# Patient Record
Sex: Female | Born: 1950 | Race: White | Hispanic: No | Smoking: Former smoker
Health system: Southern US, Community
[De-identification: ages and names within clinical notes are randomized; demographics above are authoritative.]

## PROBLEM LIST (undated history)

## (undated) DIAGNOSIS — I509 Heart failure, unspecified: Secondary | ICD-10-CM

## (undated) DIAGNOSIS — K219 Gastro-esophageal reflux disease without esophagitis: Secondary | ICD-10-CM

## (undated) DIAGNOSIS — B159 Hepatitis A without hepatic coma: Secondary | ICD-10-CM

## (undated) DIAGNOSIS — I10 Essential (primary) hypertension: Secondary | ICD-10-CM

## (undated) DIAGNOSIS — J45909 Unspecified asthma, uncomplicated: Secondary | ICD-10-CM

## (undated) DIAGNOSIS — G4730 Sleep apnea, unspecified: Secondary | ICD-10-CM

## (undated) DIAGNOSIS — C50412 Malignant neoplasm of upper-outer quadrant of left female breast: Principal | ICD-10-CM

## (undated) DIAGNOSIS — E119 Type 2 diabetes mellitus without complications: Secondary | ICD-10-CM

## (undated) DIAGNOSIS — M1990 Unspecified osteoarthritis, unspecified site: Secondary | ICD-10-CM

## (undated) DIAGNOSIS — K760 Fatty (change of) liver, not elsewhere classified: Secondary | ICD-10-CM

## (undated) DIAGNOSIS — R609 Edema, unspecified: Secondary | ICD-10-CM

## (undated) DIAGNOSIS — I4891 Unspecified atrial fibrillation: Secondary | ICD-10-CM

## (undated) DIAGNOSIS — N189 Chronic kidney disease, unspecified: Secondary | ICD-10-CM

## (undated) DIAGNOSIS — Z8489 Family history of other specified conditions: Secondary | ICD-10-CM

## (undated) DIAGNOSIS — K589 Irritable bowel syndrome without diarrhea: Secondary | ICD-10-CM

## (undated) DIAGNOSIS — E539 Vitamin B deficiency, unspecified: Secondary | ICD-10-CM

## (undated) DIAGNOSIS — M797 Fibromyalgia: Secondary | ICD-10-CM

## (undated) DIAGNOSIS — D649 Anemia, unspecified: Secondary | ICD-10-CM

## (undated) DIAGNOSIS — I4892 Unspecified atrial flutter: Secondary | ICD-10-CM

## (undated) DIAGNOSIS — G4700 Insomnia, unspecified: Secondary | ICD-10-CM

## (undated) DIAGNOSIS — Z973 Presence of spectacles and contact lenses: Secondary | ICD-10-CM

## (undated) DIAGNOSIS — Z87898 Personal history of other specified conditions: Secondary | ICD-10-CM

## (undated) DIAGNOSIS — J40 Bronchitis, not specified as acute or chronic: Secondary | ICD-10-CM

## (undated) DIAGNOSIS — E039 Hypothyroidism, unspecified: Secondary | ICD-10-CM

## (undated) HISTORY — PX: LUMBAR DISC SURGERY: SHX700

## (undated) HISTORY — PX: CHOLECYSTECTOMY: SHX55

## (undated) HISTORY — DX: Sleep apnea, unspecified: G47.30

## (undated) HISTORY — PX: CARPAL TUNNEL RELEASE: SHX101

## (undated) HISTORY — DX: Essential (primary) hypertension: I10

## (undated) HISTORY — DX: Malignant neoplasm of upper-outer quadrant of left female breast: C50.412

## (undated) HISTORY — DX: Vitamin B deficiency, unspecified: E53.9

## (undated) HISTORY — DX: Irritable bowel syndrome without diarrhea: K58.9

## (undated) HISTORY — DX: Type 2 diabetes mellitus without complications: E11.9

## (undated) HISTORY — DX: Hepatitis a without hepatic coma: B15.9

## (undated) HISTORY — DX: Unspecified atrial flutter: I48.92

## (undated) HISTORY — PX: JOINT REPLACEMENT: SHX530

## (undated) HISTORY — DX: Unspecified osteoarthritis, unspecified site: M19.90

## (undated) HISTORY — PX: ABDOMINAL HYSTERECTOMY: SHX81

## (undated) HISTORY — PX: BACK SURGERY: SHX140

## (undated) HISTORY — PX: TONSILLECTOMY: SUR1361

## (undated) HISTORY — PX: HERNIA REPAIR: SHX51

## (undated) HISTORY — PX: EYE SURGERY: SHX253

## (undated) HISTORY — PX: BREAST SURGERY: SHX581

## (undated) HISTORY — PX: CARDIAC CATHETERIZATION: SHX172

## (undated) HISTORY — PX: TRANSTHORACIC ECHOCARDIOGRAM: SHX275

## (undated) HISTORY — DX: Chronic kidney disease, unspecified: N18.9

## (undated) HISTORY — PX: MASTECTOMY: SHX3

## (undated) HISTORY — PX: DILATION AND CURETTAGE OF UTERUS: SHX78

## (undated) HISTORY — DX: Unspecified atrial fibrillation: I48.91

## (undated) HISTORY — DX: Fatty (change of) liver, not elsewhere classified: K76.0

---

## 1997-06-03 ENCOUNTER — Ambulatory Visit: Admission: RE | Admit: 1997-06-03 | Discharge: 1997-06-03 | Payer: Self-pay | Admitting: Neurosurgery

## 1997-06-09 ENCOUNTER — Encounter: Admission: RE | Admit: 1997-06-09 | Discharge: 1997-08-23 | Payer: Self-pay | Admitting: Anesthesiology

## 2000-10-02 ENCOUNTER — Encounter: Payer: Self-pay | Admitting: Orthopedic Surgery

## 2000-10-10 ENCOUNTER — Inpatient Hospital Stay (HOSPITAL_COMMUNITY): Admission: RE | Admit: 2000-10-10 | Discharge: 2000-10-15 | Payer: Self-pay | Admitting: Orthopedic Surgery

## 2000-11-19 ENCOUNTER — Encounter: Admission: RE | Admit: 2000-11-19 | Discharge: 2000-12-24 | Payer: Self-pay | Admitting: Orthopedic Surgery

## 2004-12-01 ENCOUNTER — Other Ambulatory Visit: Admission: RE | Admit: 2004-12-01 | Discharge: 2004-12-01 | Payer: Self-pay | Admitting: Obstetrics and Gynecology

## 2005-01-29 ENCOUNTER — Encounter (INDEPENDENT_AMBULATORY_CARE_PROVIDER_SITE_OTHER): Payer: Self-pay | Admitting: *Deleted

## 2005-01-29 ENCOUNTER — Ambulatory Visit (HOSPITAL_BASED_OUTPATIENT_CLINIC_OR_DEPARTMENT_OTHER): Admission: RE | Admit: 2005-01-29 | Discharge: 2005-01-29 | Payer: Self-pay | Admitting: Obstetrics and Gynecology

## 2005-08-29 ENCOUNTER — Other Ambulatory Visit: Admission: RE | Admit: 2005-08-29 | Discharge: 2005-08-29 | Payer: Self-pay | Admitting: Obstetrics and Gynecology

## 2006-06-26 ENCOUNTER — Ambulatory Visit: Admission: RE | Admit: 2006-06-26 | Discharge: 2006-06-26 | Payer: Self-pay | Admitting: Gynecologic Oncology

## 2006-06-29 ENCOUNTER — Encounter: Admission: RE | Admit: 2006-06-29 | Discharge: 2006-06-29 | Payer: Self-pay | Admitting: Urology

## 2006-07-28 ENCOUNTER — Inpatient Hospital Stay (HOSPITAL_COMMUNITY): Admission: EM | Admit: 2006-07-28 | Discharge: 2006-08-01 | Payer: Self-pay | Admitting: Emergency Medicine

## 2006-08-02 ENCOUNTER — Inpatient Hospital Stay (HOSPITAL_COMMUNITY): Admission: EM | Admit: 2006-08-02 | Discharge: 2006-08-05 | Payer: Self-pay | Admitting: Emergency Medicine

## 2006-09-18 ENCOUNTER — Ambulatory Visit: Admission: RE | Admit: 2006-09-18 | Discharge: 2006-09-18 | Payer: Self-pay | Admitting: Gynecologic Oncology

## 2008-03-11 ENCOUNTER — Other Ambulatory Visit: Admission: RE | Admit: 2008-03-11 | Discharge: 2008-03-11 | Payer: Self-pay | Admitting: Obstetrics and Gynecology

## 2008-03-11 ENCOUNTER — Encounter: Payer: Self-pay | Admitting: Obstetrics and Gynecology

## 2008-03-11 ENCOUNTER — Ambulatory Visit: Payer: Self-pay | Admitting: Obstetrics and Gynecology

## 2008-03-24 ENCOUNTER — Encounter: Admission: RE | Admit: 2008-03-24 | Discharge: 2008-03-24 | Payer: Self-pay | Admitting: Urology

## 2010-02-20 ENCOUNTER — Inpatient Hospital Stay (HOSPITAL_COMMUNITY): Admission: RE | Admit: 2010-02-20 | Discharge: 2010-02-24 | Payer: Self-pay | Admitting: Orthopedic Surgery

## 2010-03-23 ENCOUNTER — Encounter
Admission: RE | Admit: 2010-03-23 | Discharge: 2010-04-19 | Payer: Self-pay | Source: Home / Self Care | Attending: Orthopedic Surgery | Admitting: Orthopedic Surgery

## 2010-04-19 ENCOUNTER — Encounter
Admission: RE | Admit: 2010-04-19 | Discharge: 2010-05-15 | Payer: Self-pay | Source: Home / Self Care | Attending: Orthopedic Surgery | Admitting: Orthopedic Surgery

## 2010-07-04 LAB — GLUCOSE, CAPILLARY
Glucose-Capillary: 104 mg/dL — ABNORMAL HIGH (ref 70–99)
Glucose-Capillary: 139 mg/dL — ABNORMAL HIGH (ref 70–99)
Glucose-Capillary: 141 mg/dL — ABNORMAL HIGH (ref 70–99)
Glucose-Capillary: 157 mg/dL — ABNORMAL HIGH (ref 70–99)
Glucose-Capillary: 159 mg/dL — ABNORMAL HIGH (ref 70–99)
Glucose-Capillary: 162 mg/dL — ABNORMAL HIGH (ref 70–99)
Glucose-Capillary: 163 mg/dL — ABNORMAL HIGH (ref 70–99)

## 2010-07-04 LAB — CBC
HCT: 22.3 % — ABNORMAL LOW (ref 36.0–46.0)
HCT: 26.8 % — ABNORMAL LOW (ref 36.0–46.0)
Hemoglobin: 7.5 g/dL — ABNORMAL LOW (ref 12.0–15.0)
Hemoglobin: 8.8 g/dL — ABNORMAL LOW (ref 12.0–15.0)
MCH: 31 pg (ref 26.0–34.0)
MCHC: 33.7 g/dL (ref 30.0–36.0)
MCHC: 33.8 g/dL (ref 30.0–36.0)
MCHC: 33.8 g/dL (ref 30.0–36.0)
MCV: 91.8 fL (ref 78.0–100.0)
MCV: 92.9 fL (ref 78.0–100.0)
Platelets: 287 10*3/uL (ref 150–400)
Platelets: 300 10*3/uL (ref 150–400)
RBC: 2.85 MIL/uL — ABNORMAL LOW (ref 3.87–5.11)
RDW: 13.8 % (ref 11.5–15.5)
RDW: 14.2 % (ref 11.5–15.5)
WBC: 11.1 10*3/uL — ABNORMAL HIGH (ref 4.0–10.5)

## 2010-07-04 LAB — BASIC METABOLIC PANEL
BUN: 17 mg/dL (ref 6–23)
BUN: 20 mg/dL (ref 6–23)
BUN: 25 mg/dL — ABNORMAL HIGH (ref 6–23)
CO2: 17 mEq/L — ABNORMAL LOW (ref 19–32)
CO2: 28 mEq/L (ref 19–32)
Calcium: 8.7 mg/dL (ref 8.4–10.5)
Calcium: 8.7 mg/dL (ref 8.4–10.5)
Calcium: 9.1 mg/dL (ref 8.4–10.5)
Chloride: 100 mEq/L (ref 96–112)
Chloride: 98 mEq/L (ref 96–112)
Creatinine, Ser: 1.41 mg/dL — ABNORMAL HIGH (ref 0.4–1.2)
Creatinine, Ser: 1.47 mg/dL — ABNORMAL HIGH (ref 0.4–1.2)
Creatinine, Ser: 1.57 mg/dL — ABNORMAL HIGH (ref 0.4–1.2)
GFR calc non Af Amer: 36 mL/min — ABNORMAL LOW (ref >60)
Glucose, Bld: 112 mg/dL — ABNORMAL HIGH (ref 70–99)
Glucose, Bld: 137 mg/dL — ABNORMAL HIGH (ref 70–99)
Potassium: 4.4 mEq/L (ref 3.5–5.1)
Potassium: 5.7 mEq/L — ABNORMAL HIGH (ref 3.5–5.1)
Sodium: 135 mEq/L (ref 135–145)

## 2010-07-05 LAB — TYPE AND SCREEN
ABO/RH(D): O POS
Antibody Screen: NEGATIVE
Unit division: 0

## 2010-07-05 LAB — APTT: aPTT: 44 seconds — ABNORMAL HIGH (ref 24–37)

## 2010-07-05 LAB — URINALYSIS, ROUTINE W REFLEX MICROSCOPIC
Bilirubin Urine: NEGATIVE
Glucose, UA: NEGATIVE mg/dL
Hgb urine dipstick: NEGATIVE
Ketones, ur: NEGATIVE mg/dL
Nitrite: NEGATIVE
Protein, ur: NEGATIVE mg/dL
Specific Gravity, Urine: 1.022 (ref 1.005–1.030)
Urobilinogen, UA: 0.2 mg/dL (ref 0.0–1.0)
pH: 5 (ref 5.0–8.0)

## 2010-07-05 LAB — GLUCOSE, CAPILLARY
Glucose-Capillary: 108 mg/dL — ABNORMAL HIGH (ref 70–99)
Glucose-Capillary: 175 mg/dL — ABNORMAL HIGH (ref 70–99)

## 2010-07-05 LAB — COMPREHENSIVE METABOLIC PANEL
ALT: 17 U/L (ref 0–35)
AST: 23 U/L (ref 0–37)
Albumin: 4 g/dL (ref 3.5–5.2)
CO2: 26 mEq/L (ref 19–32)
Calcium: 9.7 mg/dL (ref 8.4–10.5)
Creatinine, Ser: 1.85 mg/dL — ABNORMAL HIGH (ref 0.4–1.2)
GFR calc Af Amer: 34 mL/min — ABNORMAL LOW (ref >60)
GFR calc non Af Amer: 28 mL/min — ABNORMAL LOW (ref >60)
Sodium: 137 mEq/L (ref 135–145)
Total Protein: 7.2 g/dL (ref 6.0–8.3)

## 2010-07-05 LAB — CBC
HCT: 30.7 % — ABNORMAL LOW (ref 36.0–46.0)
Hemoglobin: 10.1 g/dL — ABNORMAL LOW (ref 12.0–15.0)
MCH: 30.4 pg (ref 26.0–34.0)
MCHC: 33.1 g/dL (ref 30.0–36.0)
MCV: 91.9 fL (ref 78.0–100.0)
Platelets: 369 10*3/uL (ref 150–400)
RBC: 3.34 MIL/uL — ABNORMAL LOW (ref 3.87–5.11)
RDW: 14.1 % (ref 11.5–15.5)
WBC: 7.8 10*3/uL (ref 4.0–10.5)

## 2010-07-05 LAB — URINE MICROSCOPIC-ADD ON

## 2010-07-05 LAB — SURGICAL PCR SCREEN
MRSA, PCR: NEGATIVE
Staphylococcus aureus: NEGATIVE

## 2010-07-05 LAB — ABO/RH: ABO/RH(D): O POS

## 2010-09-05 NOTE — Consult Note (Signed)
NAMEMAURYA, Maureen [name]               ACCOUNT NO.:  [id number]   MEDICAL RECORD NO.:  [id number]          PATIENT TYPE:  OUT   LOCATION:  GYN                          FACILITY:  [facility]   PHYSICIAN:  Paola A. Alycia Rossetti, MD    DATE OF BIRTH:  March 08, 1951   DATE OF CONSULTATION:  09/18/2006  DATE OF DISCHARGE:                                 CONSULTATION   Ms. Thorson is a 60 year old who was referred to [place] by Dr. Cherylann Banas for a  right ovarian mass measuring 14 x 15 cm.  She subsequently underwent TAH-  BSO July 25, 2006.  Operative findings included a right adnexal mass  with obvious torsion and a benign endometrial endocervical polyp.  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  The uterus had an  endocervical polyp with endometrium,  unremarkable myometrium and the  left ovary was also negative.  Postoperatively she had wound separation  due to morbid obesity with a BMI of 57 and diabetes.  She currently has  been using a wound vac home and had excellent results with that.  In  addition, she was a four pack a day smoker and subsequently quit smoking  which has been dramatic for her.  She otherwise is doing fairly well.  She continues to have occasional abdominal tinges of pain and in the  evening is still requiring some Dilaudid pain medication.  She denies  any vaginal bleeding.  She has return of bowel and bladder function and  is tolerating p.o. without difficulty.   PHYSICAL EXAMINATION:  Weight 300 pounds which is down to 32 pounds from  March, blood pressure 134/80, well-nourished, well-developed female in  no acute distress.  ABDOMEN:  Shows a well-healing vertical skin  incision.  She still has an area open about 8 cm x 4 cm and 3 cm deep  with pink granulation tissue.  There is no surrounding erythema.  Abdomen is soft.  PELVIC:  External genitalia is within normal limits.  Speculum  examination  poorly tolerated by the patient.  She has a very narrow  arch.  Could not visualize the vaginal cuff.  Bimanual examination:  The  vaginal cuff is nontender and it feel smooth.  There is no fluctuance.  Exam is limited by habitus.   ASSESSMENT:  41. 60 year old status post TAH-BSO for benign disease who had a      superficial wound separation.  She was seen on April 11 for this      wound separation and.  She was complaining of shortness of breath      and wheezes.  She had O2 sat of 87%, respiratory rate of 45% and      was admitted to the hospitalist and managed on the hospitalist      service.  She was discharged 3 days later and has been doing fine      at home.  As she has not had metastatic or malignant disease, she      will follow up with [place] on a p.r.n.  basis.  She continue using her      wound vac and will      await a call from the home health agency discharging her from said      wound vac.  2. I refilled her Dilaudid 2 mg #100.  She will follow up with her      primary physician and can follow up with [place] in the future should      the need arise.      Paola A. Alycia Rossetti, MD  Electronically Signed     PAG/MEDQ  D:  09/18/2006  T:  09/18/2006  Job:  [id number]   cc:   Quillian Quince L. Cherylann Banas, M.D.  Fax: [phone]   Caswell Corwin, R.N.  [address], [place] [address]   Kerr Terance Hart, M.D.  Fax: [place]  Fax: [phone]

## 2010-09-08 NOTE — Op Note (Signed)
Maureen [name], Maureen [name]               ACCOUNT NO.:  [id number]   MEDICAL RECORD NO.:  [id number]          PATIENT TYPE:  AMB   LOCATION:  [facility]                         FACILITY:  [facility]   PHYSICIAN:  Daniel L. Gottsegen, M.D.DATE OF BIRTH:  08-11-1950   DATE OF PROCEDURE:  01/29/2005  DATE OF DISCHARGE:                                 OPERATIVE REPORT   PREOPERATIVE DIAGNOSIS:  Atypical glandular cells on Pap.   POSTOPERATIVE DIAGNOSIS:  Atypical glandular cells on Pap.   OPERATIONS:  Colposcopy with cervical biopsy, ECC, hysteroscopy with  endometrial sampling.   SURGEON:  Daniel L. Cherylann Banas, M.D.   ANESTHESIA:  General endotracheal.   INDICATIONS:  The patient is a 60 year old female with a previous conization  for CIN-3 who was seen in the office for routine yearly exam and was found  to have an atypical Pap smear showing glandular cells that were atypical. An  attempt was made to assess her in the office. Due to her size, the cervical  stenosis from her conization it was not possible to do all the appropriate  samplings, so it was felt it would be done in the operating room. In  addition, hysteroscopy would be done to be sure that the worse endometrial  area was sampled as well as removing any polyps or other pathology causing  the above.   FINDINGS:  External is normal except for some polypoid light growth off the  anterior wall of the cervix. The cervix is clean, but stenotic. The uterus  is top normal size and shape. No adnexal pathology can be appreciated. At  the time hysteroscopy, the patient had a completely normal endometrial  cavity except for what appeared to be a submucous myoma that was not  intracavitary coming off the anterior wall of the fundus.   DESCRIPTION OF PROCEDURE:  After adequate general endotracheal anesthesia,  the patient was placed in the dorsal lithotomy position, prepped and draped  in the usual sterile manner. The patient was colposcoped with  and without 4%  acetic acid. The squamocolumnar junction can be somewhat identified although  it was not a completely satisfactory colposcopy due to the patient's size  and also the previous conization. There was a polypoid like growth on the  anterior lip of cervix that did not appear to have white epithelium on it  but looked atypical enough that it should be biopsied. It was removed in  toto using cautery without any bleeding. An ECC was then done. The patient  was then dilated and she was hysteroscoped and there was no endometrial  pathology except for possibly a myoma off the anterior fundal wall that was  pushing on the cavity, it was not intracavitary. Endometrial sampling was  obtained being careful to samplein the area described above as well as the  rest of the cavity. The procedure was terminated. Blood loss was minimal,  fluid deficit was less than 100 mL. The patient tolerated the procedure well  and left the operating room satisfactory condition.      Daniel L. Cherylann Banas, M.D.  Electronically Signed  DLG/MEDQ  D:  01/29/2005  T:  01/29/2005  Job:  [id number]

## 2010-09-08 NOTE — Discharge Summary (Signed)
[facility]  Patient:    Maureen [name], Maureen [name]               MRN: [id number] Adm. Date:  [id number] Disc. Date: [id number] Attending:  Carlyon Shadow Dictator:   Peter Congo, P.A.                           Discharge Summary  ADMISSION DIAGNOSES: 1. Osteoarthritis to the left knee. 2. Fibromyalgia. 3. Irritable bowel syndrome. 4. Sleep apnea. 5. Hypertension.  DISCHARGE DIAGNOSES: 1. Osteoarthritis left knee, status post left total knee arthroplasty. 2. Postoperative hemorrhagic anemia, stable. 3. Hypertension. 4. Irritable bowel. 5. Sleep apnea.  OPERATIONS:  On October 10, 2000, the patient underwent a left total knee arthroplasty.  Surgeon:  Dr. Justice Britain.  Assistant:  Bobbye Morton, P.A.-C.  Anesthesia:  Spinal.  COMPLICATIONS:  None.  CONSULTATIONS:  None.  BRIEF ADMISSION HISTORY:  Ms. Andree is a pleasant 60 year old white female who has had ongoing problems with her knee for about five years.  X-rays revealed deterioration of the knee and tricompartmental arthritis.  She was treated with nonsteroidals, Synvisc injections which proved to be only minimal relief. After discussion and consideration, it was felt that she would benefit from undergoing a total knee arthroplasty.  The risks and benefits were discussed as well as the procedure, and she presented to the hospital on the above-stated date of the surgery.  HOSPITAL COURSE:  The patient was admitted on October 10, 2000, and had the above-stated surgery.  She tolerated the surgery well without any complications.  While in the operating room, two Hemovac drains were placed. These were discontinued on postop day #1 without difficulty.  While in the operating room, the wound was dressed in a sterile fashion.  On postop day #1, the dressings were found to be clean, dry, and intact.  On postop day #2, the dressing was changed.  The wound was found to be free of any erythema or drainage.   The wound was dressed daily and found to be free of any erythema or drainage throughout the remainder of her hospital stay.  While in the operating room, a Foley catheter was placed.  This was discontinued on postop day #1, the patient was found to be voiding well on her throughout the remainder of her hospital stay.  Postoperatively, the patient utilized a PCA of morphine for pain.  She had a known high tolerance for pain medications. She was not tolerating the PCA well, so on October 10, 2000, she was changed to a high-dose Dilaudid PCA.  This controlled her pain.  On postop day #3, the PCA was discontinued, and she did obtain adequate control from her pain utilizing OxyContin 40 mg controlled release one p.o. b.i.d. with Percocet for breakthrough pain.  Postoperatively, Ancef was used for infection control x 3 doses.  She was placed on Coumadin which was monitored by the pharmacy for DVT prophylaxis.   An IV of D5-1/2 normal saline at 75 cc per hour was started postoperatively.  This was discontinued on postop day #1.  She was found to be taking p.o. fluids and food well prior to discharge and having bowel movements.  Hemoglobin and hematocrit was monitored daily for three days and found to be stable.  She was placed on a weightbearing as tolerated activity status to the left lower extremity.  She progressed well with physical therapy and occupational therapy.  By the date of  discharge, she was ambulating 300 feet with only moderate assistance.  She wore a knee immobilizer to her left lower extremity until postop day #3.  She wears bilateral TED hose.  Her home medications were resumed with the exception of the Celebrex.  On postop day #5, it was felt that she was medically stable for discharge.  LABORATORY AND X-RAY DATA:  Hemoglobin and hematocrit on October 02, 2000, were 13.4 and 38.8 respectively.  On October 12, 2000, hemoglobin was 10.3, hematocrit was 30.0.  On October 13, 2000, hemoglobin  was 10.1, hematocrit 39.4.  PT/INR on October 11, 2000, was 14.2 and 1.2 respectively.  PT on October 12, 2000, was 17.9, INR was 1.8.  On October 14, 2000, PT was 21.7, INR was 2.5.  BMET on October 11, 2000, was within normal limits other than glucose of 154 and a BUN of 5.  UA on October 01, 2000, revealed trace hemoglobin and a few epithelial cells.  Radiology:  A two-view chest x-ray on October 02, 2000 was negative for active disease.  Mild right lower lobe atelectasis and/or scarring.  EKG on October 10, 2000, revealed normal sinus rhythm with an ST abnormality in the left preliminary.  EKG on October 02, 2000, revealed an unusual T axis, possible ectopic atrial rhythm versus lead reversal, low-voltage QRS, and was confirmed by Dr. Alla German.  CONDITION ON DISCHARGE:  Improved.  DISCHARGE DISPOSITION:  The patient was discharged home.  She was to have physical therapy and Coumadin management through [facility].  She was to continue her home medications with the exception of Celebrex.  DISCHARGE MEDICATIONS: 1. She was given prescriptions for OxyContin controlled release 40 mg #40 one    p.o. b.i.d. with no refills. 2. Percocet 5 mg #40 one to two p.o. q.4-6h. p.r.n. breakthrough pain with no    refills. 3. Robaxin #40 p.r.n. spasm with no refills. 4. Coumadin per pharmacy. 5. Tylenol for fever.  DISCHARGE INSTRUCTIONS:  She may shower on postop day #5.  Low-sodium diet.  DISCHARGE FOLLOWUP:  She is to follow up with Dr. Onnie Graham two weeks from the date of her surgery.  She is to call the office to make an appointment.  If she has any fevers greater than 101, call the office. DD:  10/15/00 TD:  10/15/00 Job: [id number]

## 2010-09-08 NOTE — Consult Note (Signed)
Maureen [name], Maureen [name]               ACCOUNT NO.:  [id number]   MEDICAL RECORD NO.:  [id number]          PATIENT TYPE:  OUT   LOCATION:  GYN                          FACILITY:  [facility]   PHYSICIAN:  Paola A. Alycia Rossetti, MD    DATE OF BIRTH:  [birth year]   DATE OF CONSULTATION:  06/26/2006  DATE OF DISCHARGE:                                 CONSULTATION   REFERRING PHYSICIAN:  Cherly Anderson. Cherylann Banas, M.D.   The patient is seen today in consultation at the request of Dr.  Cherylann Banas.  [name] is a 60 year old gravida 3, para 3 who has been  menopausal for the past 6-7 years.  She has never taken any hormone  replacement therapy.  Approximately 2 weeks ago she had a week's history  of abdominal and back pain.  She presented to her local emergency room  and had a CT scan of the abdomen and pelvis performed.  CT was performed  on June 16, 2006.  It revealed a hypodense lesion in the interpole  region of the left kidney with calcified rim.  Within the pelvis there  was a large cystic mass measuring 14.6 x 14.1 x 13.5 cm arising from the  left ovary.  The majority of contents of the cyst were simple fluid,  however, there was one slightly thickened septation along the right side  of the mass where it originated from the ovary.  There was no evidence  of any adenopathy or free fluid.  As a result of this renal mass, she  also says underwent a CT scan with contrast.  It revealed a 16 x 15 mm  right renal mid to lower pole indeterminate hypodense lesion with the  recommendations for an MRI.  The patient had a CA-125 which was 8.2.  She comes in today for evaluation.  She has been seen by Dr. Terance Hart in  the past in neurology some microscopic hematuria many years ago, the  workup of which is negative.  She is currently under his care for  evaluation of this renal mass.  She has an MRI scheduled for this coming  Saturday.  Since the pain she had 2 weeks ago resolved after about a  week, she has had  no further episodes of discomfort.  She denies any  change in bowel or bladder habits.  She did have some nausea and  vomiting with the episodes of pain but that is now decreased and  resolved.  She denies any chest pain, shortness of breath, headaches,  visual changes.  She denies any early satiety, significant change in  bowel or bladder habits, vaginal or rectal bleeding.   PAST MEDICAL HISTORY:  1. IBS.  2. Microscopic hematuria.  3. Diabetes.  She is not sure what her last A1c was.  She occasionally      does Accu-Cheks and they range in the 110s-150s.  4. She has fibromyalgia.  5. Arthritis.  6. Morbid obesity.  7. Hypercholesterolemia.  8. Hypertension.   MEDICATIONS:  Celebrex, cholestyramine, Ambien, Lomotil p.r.n.,  tizanidine p.r.n. and Lisinopril.   ALLERGIES:  KEFLEX which  causes hives.  CODEINE causes heartburn.  Narcotics such as PERCOCET causes itching.  She does well with Vicodin  and OxyContin.   PAST SURGICAL HISTORY:  1. Groin hernia repair as a child.  2. Tonsillectomy.  3. Eye surgery x2.  4. Three cesarean sections.  5. Left knee replacement.  6. Cholecystectomy.  7. Hemorrhoidectomy.  8. Diskectomy.   SOCIAL HISTORY:  She smokes three packs a day.  She has done this for 30  years.  She denies use of alcohol.  She is separated.  She is not  working.   FAMILY HISTORY:  Significant for hypertension.  Her mother had breast  cancer diagnosed at the age of 74.  Maternal grandmother had colon  cancer diagnosed at the age of 61 and her father had a myocardial  infarction about the age of 43+.   HEALTH MAINTENANCE:  Her last mammogram was October 2006.  She has not  had a colonoscopy in about 10 years.  Her last Pap smear was less than a  year ago.   PHYSICAL EXAMINATION:  VITAL SIGNS:  Weight 332 pounds, height 5 feet 4,  blood pressure 148/82, pulse 104.  GENERAL APPEARANCE:  Well-nourished, well-developed female in no acute  distress.  NECK:   Supple.  There is no lymphadenopathy, no thyromegaly.  LUNGS:  Rancorous but otherwise clear to auscultation bilaterally.  CARDIOVASCULAR:  Regular rate and rhythm.  ABDOMEN:  Morbidly obese.  She does have a rectus diastasis above the  umbilicus.  There is no lesions in the intertriginous zones.  There is  no palpable masses or no tenderness.  Exam is limited by habitus.  Groins are negative for adenopathy.  EXTREMITIES:  Well-healed surgical incision of her left leg.  She has 2+  pitting edema equal bilaterally.  PELVIC:  External genitalia is within normal limits.  Bimanual  examination of the cervix is palpably normal.  There is a mass arising  in the right adnexa.  I cannot separate it from the uterus.  It feels  mobile.  Exam is limited by morbid obesity.   ASSESSMENT:  33. A 60 year old with a large primarily cystic mass arising from the      right ovary who has no evidence for metastatic disease on her CT      scan, has a CA-125 of 8.2.  The patient does have a renal mass that      is of uncertain potential.  At this time workup is continuing.  She      would very much, which is understandable, wish to have the renal      lesion addressed at the same time as her ovarian mass, therefore      will await final disposition until she has her MRI at this coming      Saturday and has a discussion with Dr. Terance Hart regarding what      needs to be done.  She does know that our office will be closed      next week until Thursday morning.  She was given our card and was      instructed to contact us once the disposition regarding the renal      mass has so that we can coordinate schedules.  We are tentatively      considering the possibility of July 23, 2006, or July 30, 2006,      for surgery.  Surgery would include exploratory laparotomy via      vertical midline incision.  She wishes to have a TAH-BSO and     understands that attempt will be made at staging, however, because      of  her habitus even if this is a malignant process, staging may be      limited because of technical limitations and considerations.  She      accepts this.  Risk of surgery including bleeding, infection,      injury to surrounding organs including but not limited to bowel,      bladder, ureters were discussed with the patient.  The risks of      cardiovascular and thromboembolic disease were also reviewed.  We      did discuss her smoking and that it would be optimal for her      to decrease the amount of smoking that she does before surgery.      She states she has quit before when she had her three children and      will contemplate this.  2. She will stay up-to-date on her mammograms.      Paola A. Alycia Rossetti, MD  Electronically Signed     PAG/MEDQ  D:  06/26/2006  T:  06/26/2006  Job:  [id number]   cc:   Cherly Anderson. Cherylann Banas, M.D.  Fax: [place]  Fax: [place] Terance Hart, M.D.  Fax: [phone]   Caswell Corwin, R.N.  [address], [place] [address]

## 2010-09-08 NOTE — H&P (Signed)
Maureen [name], Maureen [name]               ACCOUNT NO.:  [id number]   MEDICAL RECORD NO.:  [id number]          PATIENT TYPE:  INP   LOCATION:  1402                         FACILITY:  [facility]   PHYSICIAN:  Barbette Merino, M.D.      DATE OF BIRTH:  20-Aug-1950   DATE OF ADMISSION:  08/02/2006  DATE OF DISCHARGE:                              HISTORY & PHYSICAL   PRIMARY CARE PHYSICIAN:  The patient is unassigned. Her PCP is in [place], [employer].   PRESENTING COMPLAINT:  Shortness of breath x1 day.   HISTORY OF PRESENT ILLNESS:  The patient is a 60 year old female with  recent ovarian cyst. She had total abdominal hysterectomy performed at  [facility].  When she returned here, she had a problem with nausea and  vomiting.  Dr. Alycia Rossetti, who is her gynecologist at [facility], also has  privileges here; hence she was admitted here on July 28, 2006 all the  way to August 01, 2006. During that period, she was discovered to have  some abdominal ileus.  That has resolved.  She went home yesterday and  then started having shortness of breath.  Per the patient, however, she  was wheezing even prior to leaving the hospital.  Hence she decided to  come in today.  In the ED preliminary results showed the patient having  shortness of breath but no evidence of PE.  She was however found to  have some pleural effusion.  We were subsequently called to admit the  patient.   PAST MEDICAL HISTORY:  Significant mainly for hypertension, ovarian  cyst, fibromyalgia, diet-controlled diabetes.  Morbid obesity and  irritable bowel syndrome.   ALLERGIES:  She is allergic to CODEINE, VICODIN and KEFLEX.   SOCIAL HISTORY:  The patient lives in [place], [place] with her  family.  She smoked about 4 packs a day for many years until last week.  Social alcohol.  No IV drug use.   FAMILY HISTORY:  Mom died of cancer.  Hypertension runs in both sides of  the family.   REVIEW OF SYSTEMS:  A 12-point review  of systems is mainly per HPI.  No  nausea, vomiting or diarrhea. No fever.  No headache.  Her shortness of  breath main complain, no chest pain, no cough.   PHYSICAL EXAMINATION:  VITAL SIGNS:  On exam today, temperature 98.3,  blood pressure 161/64, pulse 73, respiratory rate 24, sats 99% on 2  liters.  GENERAL:  She is awake, alert, oriented, very pleasant woman in no acute  distress but obese.  HEENT: PERRL.  EOMI.  NECK:  Supple.  No JVD, no lymphadenopathy.  RESPIRATORY:  She has good air entry bilaterally with mild expiratory  wheezing.  Mild crackles on the right lung base.  ABDOMEN:  Obese, soft with visible surgical scar which is open.  EXTREMITIES:  No edema, cyanosis or clubbing.   LABORATORY DATA:  Sodium 139, potassium 3.0, chloride 101, CO2 29,  glucose 127, BUN 2, creatinine 0.79, calcium 8.3.  Myoglobin 198.  BNP  253.  White count 9.3, hemoglobin  10.9, platelet count 222.  Chest x-ray  was limited by the patient's size but showed mild cardiomegaly without  any acute disease which can not exclude mild venous congestion.  Chest  CT without contrast showed no PE.  There is moderate pleural effusions  with interstitial and patchy air space opacities, febrile congestive  heart failure over atypical pneumonia, some nodularity in the right  upper lobe, possibly inflammatory but follow-up chest x-ray after  resolution was recommended.   ASSESSMENT:  Therefore this is a 60 year old female status post recent  surgery presenting with shortness of breath.  The patient agreed that  she has had bronchitis on and off.  She has mild wheezing which makes  this possibly COPD, however, she could also have some elements of  congestive heart failure.  Other differential here is her pleural  effusion which may be causing the problem.  She also has some nodularity  in her lungs which could be playing a role.   PLAN:  1. Shortness of breath.  Will treat the patient for COPD  exacerbation.      Will consider mild diuresis but only if fluid restriction did not      resolve any problem.  In the meantime, she will be on oxygen      nebulizers.  I will treat her empirically with some antibiotics.      We will repeat the patient's chest x-ray in the morning. If pleural      effusion is clearly visible, we may have to get thoracenteses and      cytology, especially with a nodularity in the right upper lung so      we can exclude possible malignancy.  With her heavy tobacco use,      lung cancer is definitely a possibility.  2. Lung nodule. As indicated above, the patient will require further      follow-up especially with her longstanding history of tobacco use.  3. Hypokalemia. Will replete her potassium. This is most likely coming      from her nebulizers.  4. Hypertension.  I will resume her lisinopril and I will see how the      patient does while in the hospital.  5. Moderate pleural effusion. As indicated above, we will consider      possible thoracentesis if her pleural effusion persists.  Will also      check 2-D echocardiogram.  I will follow the patient closely.      Barbette Merino, M.D.  Electronically Signed     LG/MEDQ  D:  08/02/2006  T:  08/03/2006  Job:  [id number]

## 2010-09-08 NOTE — Discharge Summary (Signed)
Maureen [name], Maureen [name]               ACCOUNT NO.:  [id number]   MEDICAL RECORD NO.:  [id number]          PATIENT TYPE:  INP   LOCATION:  1402                         FACILITY:  [facility]   PHYSICIAN:  Barbette Merino, M.D.      DATE OF BIRTH:  04-Jul-1950   DATE OF ADMISSION:  08/02/2006  DATE OF DISCHARGE:  08/05/2006                               DISCHARGE SUMMARY   PRIMARY CARE PHYSICIAN:  The patient is unassigned  with her primary  care physician being in [facility].   DISCHARGE DIAGNOSES:  1. Acute shortness of breath most likely chronic obstructive pulmonary      disease versus cardiac.  2. Hypertension.  3. Transient hypokalemia.  4. Morbid obesity.  5. Recent tobacco use.  6. Normocytic anemia.  7. Mild pleural effusion.  8. Right upper lung nodule.   DISCHARGE MEDICATIONS:  Discharge medications were:  1. Avelox 400 mg daily.  2. Phenergan 25 mg  q.6h. p.r.n.  3. Lisinopril 10 mg daily.  4. Motrin 200 mg p.r.n.  5. Celebrex 200 mg daily.  6. Dilaudid 2 mg p.o. q.6h. p.r.n.   DISPOSITION:  The patient was feeling better at the time of discharge.  She has home health arranged for wound care at home due to her open  abdominal wounds.  Otherwise, she is to continue her followup with Dr.  Alycia Rossetti and with her gynecologist.   PROCEDURES:  Procedures performed include:  1. Chest x-ray on April11, 2008 that showed mild cardiomegaly and      low lung volumes, no definite acute disease.  2. CT angiogram of the chest on April11, 2008 that showed no      embolus or dissection, moderate pleural effusion with interstitial      and patchy air space opacities, febrile congestive heart failure,      some nodularity in the right upper lobe; this could be      inflammatory.   CONSULTATIONS:  None.   BRIEF HISTORY AND PHYSICAL:  Please refer to my dictated history and  physical on admission.  In short, however, this is a 60 year old female  with history of recent surgery for ovarian  cyst.  The patient had total  abdominal hysterectomy in [facility].  She was in the hospital earlier  in April with nausea, vomiting and admitted under OB/GYN service.  She  subsequently got discharged, but returned this time around with  worsening shortness of breath.  The patient was admitted for further  management of her shortness of breath.   HOSPITAL COURSE:  PROBLEM #1:  SHORTNESS OF BREATH:  The patient had  prior history of tobacco but quit recently.  Initial evaluation showed  some mild expiratory wheezing but she had no formal diagnosis of COPD.  She was subsequently treated for both COPD and possible CHF.  She was  diuresed as well as given nebulizers, steroids and antibiotics.  She  responded to treatment promptly.  A 2-D echo was done just prior to  discharge.  The patient is to follow-up with Dr. Alycia Rossetti and further  treatment will be by her  primary care physician.   PROBLEM #2:  HYPERTENSION:  Her blood pressure was elevated on admission  but subsequently controlled.  She was doing well on her home medication  prior to discharge.   PROBLEM #3:  HYPOKALEMIA:  Again, this was repleted and was transient  with potassium 3.1.   PROBLEM #4:  MORBID OBESITY:  The patient was counseled again during  this hospitalization.   PROBLEM #5:  WOUND CARE:  The patient had an open wound in the abdomen  from her recent surgery that was being dressed while she was here in the  hospital.  She was discharged with home health RN for wound care as  well.      Barbette Merino, M.D.  Electronically Signed     LG/MEDQ  D:  09/01/2006  T:  09/02/2006  Job:  [id number]

## 2010-09-08 NOTE — H&P (Signed)
Maureen [name], Maureen [name]               ACCOUNT NO.:  [id number]   MEDICAL RECORD NO.:  [id number]          PATIENT TYPE:  INP   LOCATION:  [place]                         FACILITY:  [facility]   PHYSICIAN:  Doran Heater. Fore, M.D.   DATE OF BIRTH:  [birth year]   DATE OF ADMISSION:  07/28/2006  DATE OF DISCHARGE:                              HISTORY & PHYSICAL   CHIEF COMPLAINT:  Nausea and vomiting.   HISTORY:  The patient is a 60 year old who underwent TAH/BSO for large  right ovarian cyst in [place], [place] on July 24, 2006.  The  patient is a patient of Dr. Leonel Ramsay in [place], but the surgery was  performed by Dr. Nancy Marus of [facility].  The patient did well until  approximately midnight on July 27, 2006, after having been discharged  after a day in the hospital [facility].  She began having severe nausea  and vomiting at the time mentioned above, which has been unrelenting  until this point.  She presented in the emergency room on April 6 at  1313 hours with the complaint of severe nausea and vomiting and  weakness.  Further studies were done which indicated that the patient  had possible small-bowel obstruction versus postoperative ileus.  She is  admitted at this time for further observation and treatment if  necessary.   PAST MEDICAL HISTORY:  1. History of hypertension for which she takes lisinopril 10 mg daily.  2. She relates that she is a borderline diabetic, but she has been      managing her problem with diet only.  3. She is a smoker and denies alcohol or drug use.  4. Past medical history otherwise essentially noncontributory   REVIEW OF SYSTEMS:  HEENT: Negative prior. RESPIRATORY:  As noted above.  GASTROINTESTINAL:  As noted above.  GENITOURINARY:  As noted above.  NEUROMUSCULAR:  Negative.   PHYSICAL EXAMINATION:  VITAL SIGNS:  Weight is not recorded but the  patient is obviously morbidly obese.  Blood pressure on admission  165/75.  Two and a half hours  later the blood pressure was 168/66, pulse  approximately 95, respirations 20, temperature 97.8 on admission to the  ED and 98.9 approximately one and a half hours later.  GENERAL:  Well-developed, obese, white female in moderate distress,  retching and vomiting small amounts of gastric contents. HEENT:  Negative.  NECK:  Without bruits.  HEART:  Regular rate and rhythm without murmurs.  LUNGS:  Clear to P&A.  ABDOMEN:  Distended and quite tense.  There are no bowel sounds audible.  PELVIC:  Pelvic examination is deferred as the patient is immediately  postop.  The incision appears intact and dry and is a lower vertical  incision.  EXTREMITIES:  Quite obese but on palpation of the calves revealed no  Homans' sign.  CENTRAL NERVOUS SYSTEM:  Grossly intact.   IMPRESSION:  Status post total abdominal hysterectomy/bilateral salpingo-  oophorectomy, extreme nausea and vomiting, rule out small-bowel  obstruction.   DISPOSITION:  Admit for IV fluids and further observation.  ______________________________  Doran Heater Warnell Forester, M.D.     SRF/MEDQ  D:  07/28/2006  T:  07/29/2006  Job:  [id number]   cc:   Quillian Quince L. Cherylann Banas, M.D.  Fax: [phone]

## 2015-04-25 DIAGNOSIS — G4733 Obstructive sleep apnea (adult) (pediatric): Secondary | ICD-10-CM | POA: Diagnosis not present

## 2015-05-06 DIAGNOSIS — E785 Hyperlipidemia, unspecified: Secondary | ICD-10-CM | POA: Diagnosis not present

## 2015-05-06 DIAGNOSIS — I10 Essential (primary) hypertension: Secondary | ICD-10-CM | POA: Diagnosis not present

## 2015-05-06 DIAGNOSIS — E119 Type 2 diabetes mellitus without complications: Secondary | ICD-10-CM | POA: Diagnosis not present

## 2015-05-07 DIAGNOSIS — G4733 Obstructive sleep apnea (adult) (pediatric): Secondary | ICD-10-CM | POA: Diagnosis not present

## 2015-05-26 DIAGNOSIS — G4733 Obstructive sleep apnea (adult) (pediatric): Secondary | ICD-10-CM | POA: Diagnosis not present

## 2015-06-06 DIAGNOSIS — I10 Essential (primary) hypertension: Secondary | ICD-10-CM | POA: Diagnosis not present

## 2015-06-06 DIAGNOSIS — G4700 Insomnia, unspecified: Secondary | ICD-10-CM | POA: Diagnosis not present

## 2015-06-17 DIAGNOSIS — I4891 Unspecified atrial fibrillation: Secondary | ICD-10-CM | POA: Diagnosis not present

## 2015-06-17 DIAGNOSIS — Z6841 Body Mass Index (BMI) 40.0 and over, adult: Secondary | ICD-10-CM | POA: Diagnosis not present

## 2015-06-23 DIAGNOSIS — G4733 Obstructive sleep apnea (adult) (pediatric): Secondary | ICD-10-CM | POA: Diagnosis not present

## 2015-07-12 DIAGNOSIS — N183 Chronic kidney disease, stage 3 (moderate): Secondary | ICD-10-CM | POA: Diagnosis not present

## 2015-07-12 DIAGNOSIS — I5032 Chronic diastolic (congestive) heart failure: Secondary | ICD-10-CM | POA: Diagnosis not present

## 2015-07-12 DIAGNOSIS — E119 Type 2 diabetes mellitus without complications: Secondary | ICD-10-CM | POA: Diagnosis not present

## 2015-07-12 DIAGNOSIS — D649 Anemia, unspecified: Secondary | ICD-10-CM | POA: Diagnosis not present

## 2015-07-15 DIAGNOSIS — E02 Subclinical iodine-deficiency hypothyroidism: Secondary | ICD-10-CM | POA: Diagnosis not present

## 2015-07-15 DIAGNOSIS — E039 Hypothyroidism, unspecified: Secondary | ICD-10-CM | POA: Diagnosis not present

## 2015-07-15 DIAGNOSIS — I10 Essential (primary) hypertension: Secondary | ICD-10-CM | POA: Diagnosis not present

## 2015-07-15 DIAGNOSIS — E1129 Type 2 diabetes mellitus with other diabetic kidney complication: Secondary | ICD-10-CM | POA: Diagnosis not present

## 2015-07-19 DIAGNOSIS — R921 Mammographic calcification found on diagnostic imaging of breast: Secondary | ICD-10-CM | POA: Diagnosis not present

## 2015-07-24 DIAGNOSIS — G4733 Obstructive sleep apnea (adult) (pediatric): Secondary | ICD-10-CM | POA: Diagnosis not present

## 2015-08-15 DIAGNOSIS — J4520 Mild intermittent asthma, uncomplicated: Secondary | ICD-10-CM | POA: Diagnosis not present

## 2015-08-15 DIAGNOSIS — I482 Chronic atrial fibrillation: Secondary | ICD-10-CM | POA: Diagnosis not present

## 2015-08-15 DIAGNOSIS — G4733 Obstructive sleep apnea (adult) (pediatric): Secondary | ICD-10-CM | POA: Diagnosis not present

## 2015-08-23 DIAGNOSIS — G4733 Obstructive sleep apnea (adult) (pediatric): Secondary | ICD-10-CM | POA: Diagnosis not present

## 2015-09-09 DIAGNOSIS — E119 Type 2 diabetes mellitus without complications: Secondary | ICD-10-CM | POA: Diagnosis not present

## 2015-09-09 DIAGNOSIS — H04123 Dry eye syndrome of bilateral lacrimal glands: Secondary | ICD-10-CM | POA: Diagnosis not present

## 2015-09-09 DIAGNOSIS — G4733 Obstructive sleep apnea (adult) (pediatric): Secondary | ICD-10-CM | POA: Diagnosis not present

## 2015-09-20 DIAGNOSIS — N183 Chronic kidney disease, stage 3 (moderate): Secondary | ICD-10-CM | POA: Diagnosis not present

## 2015-09-20 DIAGNOSIS — G4733 Obstructive sleep apnea (adult) (pediatric): Secondary | ICD-10-CM | POA: Diagnosis not present

## 2015-09-20 DIAGNOSIS — E1122 Type 2 diabetes mellitus with diabetic chronic kidney disease: Secondary | ICD-10-CM | POA: Diagnosis not present

## 2015-09-20 DIAGNOSIS — Z6841 Body Mass Index (BMI) 40.0 and over, adult: Secondary | ICD-10-CM | POA: Diagnosis not present

## 2015-09-20 DIAGNOSIS — I10 Essential (primary) hypertension: Secondary | ICD-10-CM | POA: Diagnosis not present

## 2015-09-20 DIAGNOSIS — I480 Paroxysmal atrial fibrillation: Secondary | ICD-10-CM | POA: Diagnosis not present

## 2015-09-23 DIAGNOSIS — G4733 Obstructive sleep apnea (adult) (pediatric): Secondary | ICD-10-CM | POA: Diagnosis not present

## 2015-11-04 DIAGNOSIS — G4733 Obstructive sleep apnea (adult) (pediatric): Secondary | ICD-10-CM | POA: Diagnosis not present

## 2015-11-11 DIAGNOSIS — Z0000 Encounter for general adult medical examination without abnormal findings: Secondary | ICD-10-CM | POA: Diagnosis not present

## 2015-11-11 DIAGNOSIS — K130 Diseases of lips: Secondary | ICD-10-CM | POA: Diagnosis not present

## 2015-11-11 DIAGNOSIS — E538 Deficiency of other specified B group vitamins: Secondary | ICD-10-CM | POA: Diagnosis not present

## 2015-11-11 DIAGNOSIS — K580 Irritable bowel syndrome with diarrhea: Secondary | ICD-10-CM | POA: Diagnosis not present

## 2015-11-11 DIAGNOSIS — G8929 Other chronic pain: Secondary | ICD-10-CM | POA: Diagnosis not present

## 2015-11-11 DIAGNOSIS — M25512 Pain in left shoulder: Secondary | ICD-10-CM | POA: Diagnosis not present

## 2015-11-11 DIAGNOSIS — I10 Essential (primary) hypertension: Secondary | ICD-10-CM | POA: Diagnosis not present

## 2015-11-11 DIAGNOSIS — Z1159 Encounter for screening for other viral diseases: Secondary | ICD-10-CM | POA: Diagnosis not present

## 2015-12-01 DIAGNOSIS — I10 Essential (primary) hypertension: Secondary | ICD-10-CM | POA: Diagnosis not present

## 2015-12-01 DIAGNOSIS — E02 Subclinical iodine-deficiency hypothyroidism: Secondary | ICD-10-CM | POA: Diagnosis not present

## 2015-12-01 DIAGNOSIS — N289 Disorder of kidney and ureter, unspecified: Secondary | ICD-10-CM | POA: Diagnosis not present

## 2015-12-01 DIAGNOSIS — E1129 Type 2 diabetes mellitus with other diabetic kidney complication: Secondary | ICD-10-CM | POA: Diagnosis not present

## 2015-12-12 DIAGNOSIS — M19012 Primary osteoarthritis, left shoulder: Secondary | ICD-10-CM | POA: Diagnosis not present

## 2015-12-12 DIAGNOSIS — Z96653 Presence of artificial knee joint, bilateral: Secondary | ICD-10-CM | POA: Diagnosis not present

## 2016-01-03 DIAGNOSIS — R921 Mammographic calcification found on diagnostic imaging of breast: Secondary | ICD-10-CM | POA: Diagnosis not present

## 2016-01-04 ENCOUNTER — Other Ambulatory Visit: Payer: Self-pay | Admitting: Radiology

## 2016-01-04 DIAGNOSIS — Z803 Family history of malignant neoplasm of breast: Secondary | ICD-10-CM | POA: Diagnosis not present

## 2016-01-04 DIAGNOSIS — D0512 Intraductal carcinoma in situ of left breast: Secondary | ICD-10-CM | POA: Diagnosis not present

## 2016-01-04 DIAGNOSIS — R921 Mammographic calcification found on diagnostic imaging of breast: Secondary | ICD-10-CM | POA: Diagnosis not present

## 2016-01-06 ENCOUNTER — Telehealth: Payer: Self-pay | Admitting: *Deleted

## 2016-01-06 ENCOUNTER — Encounter: Payer: Self-pay | Admitting: *Deleted

## 2016-01-06 DIAGNOSIS — C50412 Malignant neoplasm of upper-outer quadrant of left female breast: Secondary | ICD-10-CM

## 2016-01-06 HISTORY — DX: Malignant neoplasm of upper-outer quadrant of left female breast: C50.412

## 2016-01-06 NOTE — Telephone Encounter (Signed)
Left vm for return call to discuss [place] on 01/11/16. Contact information provided.

## 2016-01-06 NOTE — Telephone Encounter (Signed)
Confirmed [facility] for 01/11/16 at 1215 .  Instructions and contact information given.

## 2016-01-11 ENCOUNTER — Ambulatory Visit
Admission: RE | Admit: 2016-01-11 | Discharge: 2016-01-11 | Disposition: A | Discharge status: Home / Self Care | Payer: PPO | Source: Ambulatory Visit | Attending: Radiation Oncology | Admitting: Radiation Oncology

## 2016-01-11 ENCOUNTER — Encounter: Payer: Self-pay | Admitting: General Practice

## 2016-01-11 ENCOUNTER — Encounter: Payer: Self-pay | Admitting: Hematology and Oncology

## 2016-01-11 ENCOUNTER — Ambulatory Visit: Payer: Self-pay | Admitting: General Surgery

## 2016-01-11 ENCOUNTER — Other Ambulatory Visit (HOSPITAL_BASED_OUTPATIENT_CLINIC_OR_DEPARTMENT_OTHER): Payer: PPO

## 2016-01-11 ENCOUNTER — Ambulatory Visit (HOSPITAL_BASED_OUTPATIENT_CLINIC_OR_DEPARTMENT_OTHER): Payer: PPO | Admitting: Hematology and Oncology

## 2016-01-11 ENCOUNTER — Telehealth: Payer: Self-pay | Admitting: Hematology and Oncology

## 2016-01-11 ENCOUNTER — Ambulatory Visit: Payer: PPO | Attending: General Surgery | Admitting: Physical Therapy

## 2016-01-11 ENCOUNTER — Encounter: Payer: Self-pay | Admitting: Physical Therapy

## 2016-01-11 DIAGNOSIS — C50412 Malignant neoplasm of upper-outer quadrant of left female breast: Secondary | ICD-10-CM

## 2016-01-11 DIAGNOSIS — D0512 Intraductal carcinoma in situ of left breast: Secondary | ICD-10-CM

## 2016-01-11 DIAGNOSIS — R293 Abnormal posture: Secondary | ICD-10-CM

## 2016-01-11 DIAGNOSIS — Z170 Estrogen receptor positive status [ER+]: Secondary | ICD-10-CM | POA: Diagnosis not present

## 2016-01-11 LAB — CBC WITH DIFFERENTIAL/PLATELET
BASO%: 0.3 % (ref 0.0–2.0)
BASOS ABS: 0 10*3/uL (ref 0.0–0.1)
EOS ABS: 0.3 10*3/uL (ref 0.0–0.5)
EOS%: 3.9 % (ref 0.0–7.0)
HCT: 30.7 % — ABNORMAL LOW (ref 34.8–46.6)
HEMOGLOBIN: 9.8 g/dL — AB (ref 11.6–15.9)
LYMPH%: 20.3 % (ref 14.0–49.7)
MCH: 28.4 pg (ref 25.1–34.0)
MCHC: 31.9 g/dL (ref 31.5–36.0)
MCV: 89 fL (ref 79.5–101.0)
MONO#: 0.5 10*3/uL (ref 0.1–0.9)
MONO%: 6.8 % (ref 0.0–14.0)
NEUT#: 4.5 10*3/uL (ref 1.5–6.5)
NEUT%: 68.7 % (ref 38.4–76.8)
Platelets: 324 10*3/uL (ref 145–400)
RBC: 3.45 10*6/uL — ABNORMAL LOW (ref 3.70–5.45)
RDW: 14.2 % (ref 11.2–14.5)
WBC: 6.6 10*3/uL (ref 3.9–10.3)
lymph#: 1.3 10*3/uL (ref 0.9–3.3)

## 2016-01-11 LAB — COMPREHENSIVE METABOLIC PANEL
ALBUMIN: 3.5 g/dL (ref 3.5–5.0)
ALK PHOS: 57 U/L (ref 40–150)
ALT: 13 U/L (ref 0–55)
AST: 14 U/L (ref 5–34)
Anion Gap: 11 mEq/L (ref 3–11)
BUN: 28.3 mg/dL — AB (ref 7.0–26.0)
CALCIUM: 9.9 mg/dL (ref 8.4–10.4)
CHLORIDE: 100 meq/L (ref 98–109)
CO2: 30 mEq/L — ABNORMAL HIGH (ref 22–29)
Creatinine: 2.5 mg/dL — ABNORMAL HIGH (ref 0.6–1.1)
EGFR: 19 mL/min/{1.73_m2} — AB (ref >90)
GLUCOSE: 136 mg/dL (ref 70–140)
POTASSIUM: 4.2 meq/L (ref 3.5–5.1)
SODIUM: 141 meq/L (ref 136–145)
Total Bilirubin: 0.3 mg/dL (ref 0.20–1.20)
Total Protein: 7.4 g/dL (ref 6.4–8.3)

## 2016-01-11 NOTE — Progress Notes (Signed)
[place] Psychosocial Distress Screening Spiritual Care  Shadowed by counseling intern Maureen [name], met with Maureen [name] and daughter Maureen [name] in [facility] to introduce [place] team/resources, reviewing distress screen per protocol.  The patient scored a 3 on the Psychosocial Distress Thermometer which indicates mild distress. Also assessed for distress and other psychosocial needs.   ONCBCN DISTRESS SCREENING 01/11/2016  Screening Type Initial Screening  Distress experienced in past week (1-10) 3  Practical problem type Insurance  Emotional problem type Nervousness/Anxiety;Adjusting to illness  Spiritual/Religous concerns type Facing my mortality  Information Concerns Type Lack of info about diagnosis;Lack of info about treatment;Lack of info about complementary therapy choices  Physical Problem type Pain;Breathing;Mouth sores/swallowing;Constipation/diarrhea  Referral to support programs Yes   Daughter Maureen [name] has recently separated, and she and her two boys (ages 71 and 3) are staying with pt.  Family verbalizes that this is mostly a rewarding arrangement (minus a little rowdiness).  Maureen [name] reports mild distress, acknowledging both the relief that her plan doesn't include chemo or radiation and the significant emotional/identity/self-perception adjustment to mastectomy.  Maureen [name] notes that working a 12-step program has given her coping tools, support, and perspective.  Maureen [name] and I affirmed this strength.  Follow up needed: No. Pt/family report no other needs at this time and are aware of ongoing [place] team/programming availability, but please also page if needs arise.  Thank you.   [place], [place], [facility] Pager [phone] Voicemail [phone]

## 2016-01-11 NOTE — Assessment & Plan Note (Signed)
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 It is considered a precancerous lesion. DCIS is classified as a 0. It is generally detected through mammograms as calcifications. We discussed the significance of grades and its impact on prognosis. We also discussed the importance of ER and PR receptors and their implications to adjuvant treatment options. Prognosis of DCIS dependence on grade, comedo necrosis. It is anticipated that if not treated, 20-30% of DCIS can develop into invasive breast cancer.  Recommendation: 1. Mastectomy 2. Followed by antiestrogen therapy with tamoxifen 5 years  Tamoxifen counseling: We discussed the risks and benefits of tamoxifen. These include but not limited to insomnia, hot flashes, mood changes, vaginal dryness, and weight gain. Although rare, serious side effects including endometrial cancer, risk of blood clots were also discussed. We strongly believe that the benefits far outweigh the risks. Patient understands these risks and consented to starting treatment. Planned treatment duration is 5 years.  Return to clinic after surgery to discuss the final pathology report and come up with an adjuvant treatment plan.

## 2016-01-11 NOTE — Telephone Encounter (Signed)
I called and left a message on the patient's mobile phone regarding elevated serum creatinine i instructed her to call us back to discuss this further. The last blood work that we have from before was or 5 years ago.

## 2016-01-11 NOTE — Progress Notes (Signed)
Radiation Oncology         [phone] ________________________________  Initial Outpatient Consultation  Name: Maureen [name] MRN: [id number]  Date: 01/11/2016  DOB: 26-Apr-1950  TI:[id number], Barbarann Ehlers, DO  Nicola Girt, DO   REFERRING PHYSICIAN: Nicola Girt, DO  DIAGNOSIS: The encounter diagnosis was Breast cancer of upper-outer quadrant of left female breast ([place]). 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 female who presented with increase in left calcifications in the upper outer quadrant of the left breast on short-term follow up. She has multiple clusters of calcification covering 6.4 cm.One area was biopsied and came back as Er+ (100% strong staining intensity), PR + (100% strong staining intensity), and high grade DCIS. Left axillary was negative.    PREVIOUS RADIATION THERAPY: No  PAST MEDICAL HISTORY:  has a past medical history of Arthritis; Atrial fibrillation ([place]); Atrial flutter ([place]); Breast cancer of upper-outer quadrant of left female breast ([place]) (01/06/2016); Chronic kidney disease; Diabetes ([place]); Fatty liver; Hepatitis A; Hypertension; IBS (irritable bowel syndrome); Osteoarthritis; Sleep apnea; and Vitamin B deficiency.    PAST SURGICAL HISTORY: Past Surgical History:  Procedure Laterality Date  . CARPAL TUNNEL RELEASE    . CESAREAN SECTION    . CHOLECYSTECTOMY    . HERNIA REPAIR    . TONSILLECTOMY      FAMILY HISTORY: family history includes Breast cancer in her mother; Colon cancer in her maternal grandmother.  SOCIAL HISTORY:  reports that she has quit smoking. She has never used smokeless tobacco. She reports that she does not drink alcohol or use drugs.  ALLERGIES: Cephalexin; Codeine; Doxycycline; Percodan [oxycodone-aspirin]; Hydrocodone; and Penicillin g  MEDICATIONS:  Current Outpatient Prescriptions  Medication Sig Dispense Refill    . amiodarone (PACERONE) 200 MG tablet TAKE 1 TABLET TWICE DAILY.    Marland Kitchen amitriptyline (ELAVIL) 50 MG tablet TAKE 1 TABLET BY MOUTH NIGHTLY    . apixaban (ELIQUIS) 5 MG TABS tablet TAKE 1 TABLET EVERY 12 HOURS    . benzonatate (TESSALON) 100 MG capsule TAKE ONE CAPSULE BY MOUTH 3 TIMES A DAY AS NEEDED FOR COUGH  0  . carvedilol (COREG) 6.25 MG tablet TAKE 1 TABLET BY MOUTH TWICE A DAY    . Cholecalciferol (VITAMIN D3) 2000 units capsule Take by mouth.    . Cyanocobalamin 1000 MCG/ML LIQD Inject 1,000 mcg as directed every 30 days.    Marland Kitchen doxazosin (CARDURA) 4 MG tablet Take 4 mg by mouth.    . fenofibrate 160 MG tablet TAKE 1 TABLET BY MOUTH DAILY    . furosemide (LASIX) 40 MG tablet TAKE 1 TABLET BY MOUTH EVERY DAY    . levothyroxine (SYNTHROID, LEVOTHROID) 88 MCG tablet Take one tab po daily. BRAND NAME ONLY.    Marland Kitchen Liraglutide (VICTOZA) 18 MG/3ML SOPN     . metFORMIN (GLUCOPHAGE-XR) 500 MG 24 hr tablet TAKE 2 TABLETS BY MOUTH TWICE A DAY    . tiZANidine (ZANAFLEX) 4 MG tablet TAKE 1 TABLET MY MOUTH TWICE A DAY.    . traMADol (ULTRAM) 50 MG tablet TAKE 1 TABLET BY MOUTH EVERY 6 HOURS AS NEEDED    . zolpidem (AMBIEN) 10 MG tablet TAKE 1 TABLET BY MOUTH AT BEDTIME AS NEEDED     No current facility-administered medications for this encounter.     REVIEW OF SYSTEMS:  A 15 point review of systems is documented in the electronic  medical record. This was obtained by the nursing staff. However, I reviewed this with the patient to discuss relevant findings and make appropriate changes. No reports of nipple discharge or pain prior to diagnosis Patient has bilateral shoulder pain and decreased mobility secondary due to arthritis and rotator cuff injuries   PHYSICAL EXAM:  Vitals - 1 value per visit 0/34/9179  SYSTOLIC 150  DIASTOLIC 58  Pulse 74  Temperature 98.3  Respirations 19  Weight (lb) 310  Height 5\' 4"   BMI 53.21  VISIT REPORT     Lungs are clear to auscultation bilaterally. Heart has  regular rate and rhythm. No palpable cervical, supraclavicular, or axillary adenopathy. Abdomen soft, non-tender, normal bowel sounds. Examination of the right breast reveals no palpable mass or discharge Left breast bruising in the lateral aspect of the breast with no palpable mass. No nipple discharge or bleeding. Patient is severely limited in raising her left arm  ECOG = 1  LABORATORY DATA:  Lab Results  Component Value Date   WBC 6.6 01/11/2016   HGB 9.8 (L) 01/11/2016   HCT 30.7 (L) 01/11/2016   MCV 89.0 01/11/2016   PLT 324 01/11/2016   NEUTROABS 4.5 01/11/2016   Lab Results  Component Value Date   NA 141 01/11/2016   K 4.2 01/11/2016   CL 98 02/24/2010   CO2 30 (H) 01/11/2016   GLUCOSE 136 01/11/2016   CREATININE 2.5 (H) 01/11/2016   CALCIUM 9.9 01/11/2016      RADIOGRAPHY:  Images were reviewed in the clinic. 3D mammogram diagnostic bilateral digital W/CAD- bilateral on 01/03/16 revealed multiple clusters of punctate round calcifications in the left breast are indeterminate. Ultrasound will be performed to evaluate the left upper outer quadrant and axilla. This ultrasound performed on 01/03/16 revealed several areas of what appears to be focal microcystic pattern. They are identified with the larges at 2:00, 3 cm from the nipple. This is not considered a superior target for biopsy thus prefer stereo approach.    The stereotactic guided biopsy performed on 01/04/16 of the left breast upper outer posterior depth was successful  With no apparent post procedure complications. The image specimens includes the calcifications. Pathology results are pending and a final report will be issued when available.    IMPRESSION: High grade DCIS. On the patient's mammogram she has a large area extending 6 cm this would be difficult to remove and obtain a reasonable cosmetic outcome. In the addition the patient has significant shoulder issues making radiation difficult. Patient feels comfortable  in light of these issues with a mastectomy. She will have a sintenel lymph node in the event she has invasive disease. After her surgery she will proceed with adjuvant hormonal therapy. At this time there are no indications for radiation therapy post mastectomy but if the patient has a positive surgical margin we will consider radiation treatment.  PLAN: prn follow up in radiation oncology. Dr. Lindi Adie  has called the patient to inform her of her elevated creatinine    ------------------------------------------------  Blair Promise, PhD, MD  This document serves as a record of services personally performed by Gery Pray, MD. It was created on his behalf by Bethann Humble, a trained medical scribe. The creation of this record is based on the scribe's personal observations and the provider's statements to them. This document has been checked and approved by the attending provider.

## 2016-01-11 NOTE — Patient Instructions (Signed)

## 2016-01-11 NOTE — Therapy (Signed)
[place], [place], [address] Phone: [phone]   Fax:  [phone]  Physical Therapy Evaluation  Patient Details  Name: Maureen [name] MRN: [id number] Date of Birth: 1950/06/30 Referring Provider: Dr. [name]  Encounter Date: 01/11/2016      PT End of Session - 01/11/16 1555    Visit Number 1   Number of Visits 1   PT Start Time 1353   PT Stop Time 1418   PT Time Calculation (min) 25 min   Activity Tolerance Patient tolerated treatment well   Behavior During Therapy [facility] for tasks assessed/performed      Past Medical History:  Diagnosis Date  . Arthritis   . Atrial fibrillation ([place])   . Atrial flutter ([place])   . Breast cancer of upper-outer quadrant of left female breast ([place]) 01/06/2016  . Chronic kidney disease   . Diabetes ([place])   . Fatty liver   . Hepatitis A   . Hypertension   . IBS (irritable bowel syndrome)   . Osteoarthritis   . Sleep apnea   . Vitamin B deficiency     Past Surgical History:  Procedure Laterality Date  . CARPAL TUNNEL RELEASE    . CESAREAN SECTION    . CHOLECYSTECTOMY    . HERNIA REPAIR    . TONSILLECTOMY      There were no vitals filed for this visit.       Subjective Assessment - 01/11/16 1556    Subjective Patient reports she is here today to be seen by her medical team for her mewly diagnosed left breast cancer.   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  It measures 6.4 cm and is ER/PR positive.   Patient Stated Goals Reduce lymphedema risk and learn post op HEP            [facility] PT Assessment - 01/11/16 0001      Assessment   Medical Diagnosis Left breast cancer   Referring Provider Dr. [name]   Onset Date/Surgical Date 01/03/16   Hand Dominance Right   Prior Therapy none     Precautions   Precautions Other (comment)    Precaution Comments Active left breast cancer and severe left shoulder osteoarthritis     Restrictions   Weight Bearing Restrictions No     Balance Screen   Has the patient fallen in the past 6 months Yes   How many times? 1  Reports she tripped on rug but does not have balance problem   Has the patient had a decrease in activity level because of a fear of falling?  No   Is the patient reluctant to leave their home because of a fear of falling?  No     Home Ecologist residence   Living Arrangements Children  Daughter and 2 and 3 y.o. grandchildren   Available Help at Discharge Family     Prior Function   Level of [place] Retired   Leisure She does not exercise     Cognition   Overall Cognitive Status Within Functional Limits for tasks assessed     Posture/Postural Control   Posture/Postural Control Postural limitations   Postural Limitations Forward head;Rounded Shoulders     ROM / Strength   AROM / PROM / Strength Strength;AROM     AROM  AROM Assessment Site Shoulder;Cervical   Right/Left Shoulder Right;Left   Right Shoulder Extension 56 Degrees   Right Shoulder Flexion 165 Degrees   Right Shoulder ABduction 169 Degrees   Right Shoulder Internal Rotation 60 Degrees   Right Shoulder External Rotation 78 Degrees   Left Shoulder Extension 45 Degrees   Left Shoulder Flexion 95 Degrees   Left Shoulder ABduction 122 Degrees   Left Shoulder Internal Rotation 41 Degrees   Left Shoulder External Rotation 35 Degrees  All left shoulder AROM very painful   Cervical Flexion WNL   Cervical Extension WNL   Cervical - Right Side Bend WNL   Cervical - Left Side Bend 25% limited and reports this is due to left shoulder pain   Cervical - Right Rotation WNL   Cervical - Left Rotation 25% limited and reports this is due to left shoulder pain     Strength   Overall Strength Unable to assess  Did not assess due to severe  pain in left shoulder           LYMPHEDEMA/ONCOLOGY QUESTIONNAIRE - 01/11/16 1554      Type   Cancer Type Left breast cancer     Lymphedema Assessments   Lymphedema Assessments Upper extremities     Right Upper Extremity Lymphedema   10 cm Proximal to Olecranon Process 41.5 cm   Olecranon Process 29.8 cm   10 cm Proximal to Ulnar Styloid Process 27.7 cm   Just Proximal to Ulnar Styloid Process 20 cm   Across Hand at [employer] 19.6 cm   At [place] of 2nd Digit 6.8 cm     Left Upper Extremity Lymphedema   10 cm Proximal to Olecranon Process 45.8 cm   Olecranon Process 31.2 cm   10 cm Proximal to Ulnar Styloid Process 27.5 cm   Just Proximal to Ulnar Styloid Process 20.2 cm   Across Hand at [employer] 19.8 cm   At [place] of 2nd Digit 6.6 cm             Patient was instructed today in a home exercise program today for post op shoulder range of motion. These included active assist shoulder flexion in sitting, scapular retraction, wall walking with shoulder abduction, and hands behind head external rotation.  She was encouraged to do these twice a day, holding 3 seconds and repeating 5 times when permitted by her physician.               PT Education - 01/11/16 1555    Education provided Yes   Education Details Lymphedema risk reduction and post op shoulder ROM HEP   Person(s) Educated Patient;Child(ren)   Methods Explanation;Demonstration;Handout   Comprehension Returned demonstration;Verbalized understanding              Breast Clinic Goals - 01/11/16 1559      Patient will be able to verbalize understanding of pertinent lymphedema risk reduction practices relevant to her diagnosis specifically related to skin care.   Time 1   Period Days   Status Achieved     Patient will be able to return demonstrate and/or verbalize understanding of the post-op home exercise program related to regaining shoulder range of motion.   Time 1   Period Days    Status Achieved     Patient will be able to verbalize understanding of the importance of attending the postoperative After Breast Cancer Class for further lymphedema risk reduction education and therapeutic exercise.   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  It measures 6.4 cm and is ER/PR positive.  Her multidisciplinary medical team met prior to her assessment to determine a recommended treatment plan.  She is planning to have a left mastectomy and sentinel node biopsy followed by anti-estrogen therapy.  She will benefit from post op PT to regain shoulder ROM at least to baseline to better prepare her for a total shoulder replacement.  Her eval is of moderate complexity due to her current living situation with recently having 2 very young grandchildren move in with her and also needing a total shoulder replacement.     Rehab Potential Excellent   Clinical Impairments Affecting Rehab Potential Pre-existing left shoulder severe osteoarthritis   PT Frequency One time visit   PT Treatment/Interventions Patient/family education;Therapeutic exercise   PT Next Visit Plan Will f/u after surgery   PT Home Exercise Plan Post op shoulder ROM HEP   Consulted and Agree with Plan of Care Patient;Family member/caregiver   Family Member Consulted daughter      Patient will benefit from skilled therapeutic intervention in order to improve the following deficits and impairments:  Postural dysfunction, Decreased knowledge of precautions, Pain, Impaired UE functional use, Decreased range of motion  Visit Diagnosis: Carcinoma of upper-outer quadrant of left female breast ([place]) - Plan: PT plan of care cert/re-cert  Abnormal posture - Plan: PT plan of care cert/re-cert  Patient will follow up at outpatient cancer rehab if needed following  surgery.  If the patient requires physical therapy at that time, a specific plan will be dictated and sent to the referring physician for approval. The patient was educated today on appropriate basic range of motion exercises to begin post operatively and the importance of attending the After Breast Cancer class following surgery.  Patient was educated today on lymphedema risk reduction practices as it pertains to recommendations that will benefit the patient immediately following surgery.  She verbalized good understanding.  No additional physical therapy is indicated at this time.        G-Codes - 01/15/2016 1600    Functional Assessment Tool Used Clinical Judgement   Functional Limitation Carrying, moving and handling objects   Carrying, Moving and Handling Objects Current Status [phone]) At least 40 percent but less than 60 percent impaired, limited or restricted   Carrying, Moving and Handling Objects Goal Status (L9379) At least 40 percent but less than 60 percent impaired, limited or restricted   Carrying, Moving and Handling Objects Discharge Status [phone]) At least 40 percent but less than 60 percent impaired, limited or restricted       Problem List Patient Active Problem List   Diagnosis Date Noted  . Breast cancer of upper-outer quadrant of left female breast ([place]) 01/06/2016    Annia Friendly, PT 01-15-16 4:03 PM  [place], [place], [address] Phone: [phone]   Fax:  [phone]  Name: Maureen [name] MRN: [id number] Date of Birth: September 24, 1950

## 2016-01-11 NOTE — Progress Notes (Signed)
[place] NOTE  Patient Care Team: Nicola Girt, DO as PCP - General (Internal Medicine) [name] III, MD as Consulting Physician (General Surgery) Nicholas Lose, MD as Consulting Physician (Hematology and Oncology) Gery Pray, MD as Consulting Physician (Radiation Oncology)  CHIEF COMPLAINTS/PURPOSE OF CONSULTATION:  Newly diagnosed breast cancer  HISTORY OF PRESENTING ILLNESS:  Maureen [name] 65 y.o. female is here because of recent diagnosis of  Left breast DCIS. Patient had a routine screening mammogram that detected calcifications in the left breast. She then underwent ultrasound and biopsies which revealed grade 3 DCIS that was ER/PR positive. Patient was presented this morning of the multidisciplinary tumor board and she is here today to discuss the treatment  I reviewed her records extensively and collaborated the history with the patient.  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 Arthritis   . Atrial fibrillation ([place])   . Atrial flutter ([place])   . Breast cancer of upper-outer quadrant of left female breast ([place]) 01/06/2016  . Chronic kidney disease   . Diabetes ([place])   . Fatty liver   . Hepatitis A   . Hypertension   . IBS (irritable bowel syndrome)   . Osteoarthritis   . Sleep apnea   . Vitamin B deficiency     SURGICAL HISTORY: Past Surgical History:  Procedure Laterality Date  . CARPAL TUNNEL RELEASE    . CESAREAN SECTION    . CHOLECYSTECTOMY    . HERNIA REPAIR    . TONSILLECTOMY      SOCIAL HISTORY: Social History   Social History  . Marital status: Divorced    Spouse name: N/A  . Number of children: N/A  .  Years of education: N/A   Occupational History  . Not on file.   Social History Main Topics  . Smoking status: Former Research scientist (life sciences)  . Smokeless tobacco: Never Used  . Alcohol use No  . Drug use: No  . Sexual activity: Not on file   Other Topics Concern  . Not on file   Social History Narrative  . No narrative on file    FAMILY HISTORY: Family History  Problem Relation Age of Onset  . Breast cancer Mother   . Colon cancer Maternal Grandmother     ALLERGIES:  has no allergies on file.  MEDICATIONS:  No current outpatient prescriptions on file.   No current facility-administered medications for this visit.     REVIEW OF SYSTEMS:   Constitutional: Denies fevers, chills or abnormal night sweats Eyes: Denies blurriness of vision, double vision or watery eyes Ears, nose, mouth, throat, and face: Denies mucositis or sore throat Respiratory: Denies cough, dyspnea or wheezes Cardiovascular: Denies palpitation, chest discomfort or lower extremity swelling Gastrointestinal:  Denies nausea, heartburn or change in bowel habits Skin: Denies abnormal skin rashes Lymphatics: Denies new lymphadenopathy or easy bruising Neurological:Denies numbness, tingling or new weaknesses Behavioral/Psych: Mood is stable, no new changes  Breast:  Denies any palpable lumps or discharge All other systems were reviewed with the patient and are negative.  PHYSICAL EXAMINATION: ECOG PERFORMANCE STATUS: 0 - Asymptomatic  Vitals:   01/11/16 1248  BP: (!) 147/58  Pulse: 74  Resp: 19  Temp: 98.3 F (36.8 C)   Filed Weights   01/11/16 1248  Weight: (!) 310 lb (140.6 kg)    GENERAL:alert, no distress and comfortable SKIN: skin color, texture, turgor are normal, no rashes or significant lesions EYES: normal, conjunctiva are pink and non-injected, sclera clear OROPHARYNX:no exudate, no erythema and lips, buccal mucosa, and tongue normal  NECK: supple, thyroid normal size, non-tender, without  nodularity LYMPH:  no palpable lymphadenopathy in the cervical, axillary or inguinal LUNGS: clear to auscultation and percussion with normal breathing effort HEART: regular rate & rhythm and no murmurs and no lower extremity edema ABDOMEN:abdomen soft, non-tender and normal bowel sounds Musculoskeletal:no cyanosis of digits and no clubbing  PSYCH: alert & oriented x 3 with fluent speech NEURO: no focal motor/sensory deficits BREAST: No palpable nodules in breast. No palpable axillary or supraclavicular lymphadenopathy (exam performed in the presence of a chaperone)   LABORATORY DATA:  I have reviewed the data as listed Lab Results  Component Value Date   WBC 6.6 01/11/2016   HGB 9.8 (L) 01/11/2016   HCT 30.7 (L) 01/11/2016   MCV 89.0 01/11/2016   PLT 324 01/11/2016   Lab Results  Component Value Date   NA 141 01/11/2016   K 4.2 01/11/2016   CL 98 02/24/2010   CO2 30 (H) 01/11/2016    RADIOGRAPHIC STUDIES: I have personally reviewed the radiological reports and agreed with the findings in the report.  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 It is considered a precancerous lesion. DCIS is classified as a 0. It is generally detected through mammograms as calcifications. We discussed the significance of grades and its impact on prognosis. We also discussed the importance of ER and PR receptors and their implications to adjuvant treatment options. Prognosis of DCIS dependence on grade, comedo necrosis. It is anticipated that if not treated, 20-30% of DCIS can develop into invasive breast cancer.  Recommendation: 1. Mastectomy 2. Followed by antiestrogen therapy with  tamoxifen 5 years  Tamoxifen counseling: We discussed the risks and benefits of tamoxifen. These include but not limited to insomnia, hot flashes, mood changes, vaginal dryness, and weight gain. Although rare, serious side effects including endometrial cancer, risk of blood clots were also discussed. We strongly believe that the benefits far outweigh the risks. Patient understands these risks and consented to starting treatment. Planned treatment duration is 5 years.  Return to clinic after surgery to discuss the final pathology report and come up with an adjuvant treatment plan.     All questions were answered. The patient knows to call the clinic with any problems, questions or concerns.    Rulon Eisenmenger, MD 01/11/16

## 2016-01-16 DIAGNOSIS — E119 Type 2 diabetes mellitus without complications: Secondary | ICD-10-CM | POA: Diagnosis not present

## 2016-01-16 DIAGNOSIS — D0512 Intraductal carcinoma in situ of left breast: Secondary | ICD-10-CM | POA: Diagnosis not present

## 2016-01-16 DIAGNOSIS — I5032 Chronic diastolic (congestive) heart failure: Secondary | ICD-10-CM | POA: Diagnosis not present

## 2016-01-16 DIAGNOSIS — N183 Chronic kidney disease, stage 3 (moderate): Secondary | ICD-10-CM | POA: Diagnosis not present

## 2016-01-17 ENCOUNTER — Telehealth: Payer: Self-pay | Admitting: *Deleted

## 2016-01-17 NOTE — Telephone Encounter (Signed)
Spoke to pt concerning [place] from 01/11/16. Denies questions or concerns at this time. Encourage pt to call with needs. Received verbal understanding.

## 2016-01-30 DIAGNOSIS — E119 Type 2 diabetes mellitus without complications: Secondary | ICD-10-CM | POA: Diagnosis not present

## 2016-01-30 DIAGNOSIS — I1310 Hypertensive heart and chronic kidney disease without heart failure, with stage 1 through stage 4 chronic kidney disease, or unspecified chronic kidney disease: Secondary | ICD-10-CM | POA: Diagnosis not present

## 2016-01-30 DIAGNOSIS — N189 Chronic kidney disease, unspecified: Secondary | ICD-10-CM | POA: Diagnosis not present

## 2016-02-01 DIAGNOSIS — I480 Paroxysmal atrial fibrillation: Secondary | ICD-10-CM | POA: Diagnosis not present

## 2016-02-01 DIAGNOSIS — Z6841 Body Mass Index (BMI) 40.0 and over, adult: Secondary | ICD-10-CM | POA: Diagnosis not present

## 2016-02-01 DIAGNOSIS — G4733 Obstructive sleep apnea (adult) (pediatric): Secondary | ICD-10-CM | POA: Diagnosis not present

## 2016-02-01 DIAGNOSIS — N183 Chronic kidney disease, stage 3 (moderate): Secondary | ICD-10-CM | POA: Diagnosis not present

## 2016-02-01 DIAGNOSIS — E039 Hypothyroidism, unspecified: Secondary | ICD-10-CM | POA: Diagnosis not present

## 2016-02-01 DIAGNOSIS — I10 Essential (primary) hypertension: Secondary | ICD-10-CM | POA: Diagnosis not present

## 2016-02-01 DIAGNOSIS — E1122 Type 2 diabetes mellitus with diabetic chronic kidney disease: Secondary | ICD-10-CM | POA: Diagnosis not present

## 2016-02-03 ENCOUNTER — Telehealth: Payer: Self-pay | Admitting: *Deleted

## 2016-02-03 NOTE — Telephone Encounter (Signed)
Pt with general questions about care after surgery. Informed pt that she will receive d/c instructions after surgery on care and recovery.

## 2016-02-05 DIAGNOSIS — J209 Acute bronchitis, unspecified: Secondary | ICD-10-CM | POA: Diagnosis not present

## 2016-02-05 DIAGNOSIS — R05 Cough: Secondary | ICD-10-CM | POA: Diagnosis not present

## 2016-02-07 DIAGNOSIS — B9789 Other viral agents as the cause of diseases classified elsewhere: Secondary | ICD-10-CM | POA: Diagnosis not present

## 2016-02-07 DIAGNOSIS — J4521 Mild intermittent asthma with (acute) exacerbation: Secondary | ICD-10-CM | POA: Diagnosis not present

## 2016-02-07 DIAGNOSIS — J069 Acute upper respiratory infection, unspecified: Secondary | ICD-10-CM | POA: Diagnosis not present

## 2016-02-08 ENCOUNTER — Other Ambulatory Visit (HOSPITAL_COMMUNITY): Payer: PPO

## 2016-02-09 DIAGNOSIS — I482 Chronic atrial fibrillation: Secondary | ICD-10-CM | POA: Diagnosis not present

## 2016-02-09 DIAGNOSIS — J4520 Mild intermittent asthma, uncomplicated: Secondary | ICD-10-CM | POA: Diagnosis not present

## 2016-02-09 DIAGNOSIS — G4733 Obstructive sleep apnea (adult) (pediatric): Secondary | ICD-10-CM | POA: Diagnosis not present

## 2016-02-10 DIAGNOSIS — G4733 Obstructive sleep apnea (adult) (pediatric): Secondary | ICD-10-CM | POA: Diagnosis not present

## 2016-02-15 ENCOUNTER — Ambulatory Visit (HOSPITAL_COMMUNITY): Admission: RE | Admit: 2016-02-15 | Payer: PPO | Source: Ambulatory Visit | Admitting: General Surgery

## 2016-02-15 ENCOUNTER — Encounter (HOSPITAL_COMMUNITY): Admission: RE | Admit: 2016-02-15 | Payer: PPO | Source: Ambulatory Visit

## 2016-02-15 ENCOUNTER — Encounter (HOSPITAL_COMMUNITY): Admission: RE | Payer: Self-pay | Source: Ambulatory Visit

## 2016-02-15 DIAGNOSIS — N289 Disorder of kidney and ureter, unspecified: Secondary | ICD-10-CM | POA: Diagnosis not present

## 2016-02-15 DIAGNOSIS — R609 Edema, unspecified: Secondary | ICD-10-CM | POA: Diagnosis not present

## 2016-02-15 DIAGNOSIS — Z6841 Body Mass Index (BMI) 40.0 and over, adult: Secondary | ICD-10-CM | POA: Diagnosis not present

## 2016-02-15 DIAGNOSIS — I480 Paroxysmal atrial fibrillation: Secondary | ICD-10-CM | POA: Diagnosis not present

## 2016-02-15 DIAGNOSIS — I10 Essential (primary) hypertension: Secondary | ICD-10-CM | POA: Diagnosis not present

## 2016-02-15 SURGERY — MASTECTOMY WITH SENTINEL LYMPH NODE BIOPSY
Anesthesia: General | Site: Breast | Laterality: Bilateral

## 2016-02-21 NOTE — Assessment & Plan Note (Deleted)
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 Mastectomy 2. Followed by antiestrogen therapy with tamoxifen 5 years

## 2016-02-22 ENCOUNTER — Ambulatory Visit: Payer: PPO | Admitting: Hematology and Oncology

## 2016-02-23 ENCOUNTER — Encounter: Payer: Self-pay | Admitting: *Deleted

## 2016-02-23 NOTE — Progress Notes (Signed)
[place] [facility] Social Work  Clinical Social Work was referred by pt  to review and complete healthcare advance directives.  Clinical Social Worker met with patient  in [place] office.  The patient designated Jennye Moccasin as their primary healthcare agent and Tacori Kvamme as their secondary agent.  Patient also completed healthcare living will.    Clinical Social Worker notarized documents and made copies for patient/family. Clinical Social Worker will send documents to medical records to be scanned into patient's chart. Clinical Social Worker encouraged patient/family to contact with any additional questions or concerns.  Loren Racer, [place] Worker [place]  [place] Phone: [phone] Fax: [phone]

## 2016-02-27 ENCOUNTER — Encounter (HOSPITAL_COMMUNITY)
Admission: RE | Admit: 2016-02-27 | Discharge: 2016-02-27 | Disposition: A | Discharge status: Home / Self Care | Payer: PPO | Source: Ambulatory Visit | Attending: General Surgery | Admitting: General Surgery

## 2016-02-27 ENCOUNTER — Encounter (HOSPITAL_COMMUNITY): Payer: Self-pay

## 2016-02-27 DIAGNOSIS — Z01812 Encounter for preprocedural laboratory examination: Secondary | ICD-10-CM | POA: Insufficient documentation

## 2016-02-27 HISTORY — DX: Personal history of other specified conditions: Z87.898

## 2016-02-27 HISTORY — DX: Fibromyalgia: M79.7

## 2016-02-27 HISTORY — DX: Edema, unspecified: R60.9

## 2016-02-27 HISTORY — DX: Gastro-esophageal reflux disease without esophagitis: K21.9

## 2016-02-27 HISTORY — DX: Insomnia, unspecified: G47.00

## 2016-02-27 HISTORY — DX: Family history of other specified conditions: Z84.89

## 2016-02-27 HISTORY — DX: Heart failure, unspecified: I50.9

## 2016-02-27 HISTORY — DX: Bronchitis, not specified as acute or chronic: J40

## 2016-02-27 HISTORY — DX: Anemia, unspecified: D64.9

## 2016-02-27 HISTORY — DX: Hypothyroidism, unspecified: E03.9

## 2016-02-27 HISTORY — DX: Presence of spectacles and contact lenses: Z97.3

## 2016-02-27 LAB — CBC
HCT: 31.5 % — ABNORMAL LOW (ref 36.0–46.0)
HEMOGLOBIN: 9.9 g/dL — AB (ref 12.0–15.0)
MCH: 28 pg (ref 26.0–34.0)
MCHC: 31.4 g/dL (ref 30.0–36.0)
MCV: 89 fL (ref 78.0–100.0)
PLATELETS: 391 10*3/uL (ref 150–400)
RBC: 3.54 MIL/uL — AB (ref 3.87–5.11)
RDW: 15.3 % (ref 11.5–15.5)
WBC: 5.4 10*3/uL (ref 4.0–10.5)

## 2016-02-27 LAB — COMPREHENSIVE METABOLIC PANEL
ALK PHOS: 64 U/L (ref 38–126)
ALT: 17 U/L (ref 14–54)
ANION GAP: 11 (ref 5–15)
AST: 22 U/L (ref 15–41)
Albumin: 3.8 g/dL (ref 3.5–5.0)
BILIRUBIN TOTAL: 0.5 mg/dL (ref 0.3–1.2)
BUN: 30 mg/dL — ABNORMAL HIGH (ref 6–20)
CALCIUM: 9.8 mg/dL (ref 8.9–10.3)
CO2: 29 mmol/L (ref 22–32)
CREATININE: 2.17 mg/dL — AB (ref 0.44–1.00)
Chloride: 98 mmol/L — ABNORMAL LOW (ref 101–111)
GFR, EST AFRICAN AMERICAN: 26 mL/min — AB (ref >60)
GFR, EST NON AFRICAN AMERICAN: 22 mL/min — AB (ref >60)
Glucose, Bld: 152 mg/dL — ABNORMAL HIGH (ref 65–99)
Potassium: 3.6 mmol/L (ref 3.5–5.1)
SODIUM: 138 mmol/L (ref 135–145)
TOTAL PROTEIN: 6.5 g/dL (ref 6.5–8.1)

## 2016-02-27 LAB — GLUCOSE, CAPILLARY: Glucose-Capillary: 170 mg/dL — ABNORMAL HIGH (ref 65–99)

## 2016-02-27 NOTE — Progress Notes (Signed)
Anesthesia Chart Review: Patient is a 65 year old female scheduled for left mastectomy with left sentinel lymph node biopsy, right prophylactic mastectomy on 02/28/16 by Dr. Marlou Starks.  History includes left breast DCIS, former smoker (quit '08), DM2, HTN, fatty liver, hepatitis A, IBS, CKD stage III, OSA (CPAP), afib/flutter, CHF, hypothyroidism, insomnia, GERD, fibromyalgia, edema, anemia, tonsillectomy, cholecystectomy, hysterectomy '08, left TKA '01. BMI is consistent with super morbid obesity.   - PCP is Dr. Doug Sou.  - Pulmonologist is Dr. Francena Hanly with [facility]. - Cardiologist is Dr. Adrian Prows with [facility], last visit with Chesley Mires, PA-C on 02/15/16 for volume overload/BLE edema despite increased Lasix with 13 lb weight loss (was breathing better though after receiving antibiotics for URI within previous two weeks).  He ordered an echo and CMET. Echo has not been done yet. She was previously seen on 02/01/16 There was discussion about considering ablation for patient's PAF, but would readdress following mastectomy. Patient was given permission to hold Eliquis 2 days prior to surgery.  - Nephrologist is Dr. Brayton El with [facility]. - Endocrinologist is Dr. Iran Planas with [facility]. He recently increased her levothyroxine from 88 mcg to 100 mcg after 02/01/16 labs showed a TSH of 7.200. - HEM-ONC is Dr. Nicholas Lose.    Meds include amiodarone, amitriptyline, Eliquis, Coreg, clindamycin (PRN dental procedures), doxazosin, fenofibrate, Lasix, levothyroxine, Victoza, metformin, Zanaflex, tramadol, Ambien. Last Eliquis dose 02/25/16 AM.  BP (!) 135/52   Pulse 75   Temp 36.5 C   Resp 20   Ht 5\' 4"  (1.626 m)   Wt (!) 301 lb (136.5 kg)   SpO2 95%   BMI 51.67 kg/m    02/15/16 EKG [facility]): Tracing requested, but by 02/15/16 cardiology office note, "EKG shows sinus rhythm with nonspecific interventricular conduction delay and no acute findings."    05/06/14 Echo [facility]): Summary: Mild concentric LVH, difficult to evaluate LV systolic function due to rapid heart rate in afib/flutter. LVF is probably low normal. Mild MR. (By 05/06/14 admission H&P, "A quick bedside echocardiogram showed an EF of about 40-45%..")  05/07/14 Cardiac cath [facility]): Normal coronary arteries, normal LVF with EF 65%.  Preoperative labs noted. H/H 9.9/31.5, consistent with labs from 01/11/16. BUN 30, Cr 2.17 (previously 2.04 on 01/30/16 and 2.5 01/11/16), glucose 152, K 3.6. AST/ALT WNL. A1c on 01/30/16 was 6.5 (Care Everywhere).  Discussed above with anesthesiologists Dr. Therisa Doyne and Dr. Linna Caprice. Preference would be for patient to have the echo as ordered since she apparently had an acute change in her status (worsening edema with PND) just a few weeks ago. Proceeding without the echo would depend on exam findings (including repeat CXR) and patient's symptoms, urgency of the procedure, etc. Dr. Linna Caprice called and spoke with Dr. Marlou Starks and a decision was make to postpone surgery until patient has echo with new clearance from cardiology. Dr. Ethlyn Gallery staff will get in touch with patient.  George Hugh [facility] Short Stay Center/Anesthesiology Phone [phone] 02/27/2016 3:42 PM

## 2016-02-27 NOTE — Pre-Procedure Instructions (Signed)
Maureen [name]  02/27/2016      [id number] [address], [place] [address] Phone: [phone] Fax: [phone]    Your procedure is scheduled on Tuesday, February 28, 2016  Report to [facility] Admitting at 5:30 A.M.  Call this number if you have problems the morning of surgery:  [phone]   Remember:  Do not eat food or drink liquids after midnight.  Take these medicines the morning of surgery with A SIP OF WATER :amiodarone (PACERONE), carvedilol (COREG), if needed:traMADol (ULTRAM) for pain Stop taking Aspirin, vitamins, fish oil and herbal medications. Do not take any NSAIDs ie: Ibuprofen, Advil, Naproxen, BC and Goody Powder or any medication containing Aspirin; stop now.    How to Manage Your Diabetes Before and After Surgery  Why is it important to control my blood sugar before and after surgery? . Improving blood sugar levels before and after surgery helps healing and can limit problems. . A way of improving blood sugar control is eating a healthy diet by: o  Eating less sugar and carbohydrates o  Increasing activity/exercise o  Talking with your doctor about reaching your blood sugar goals . High blood sugars (greater than 180 mg/dL) can raise your risk of infections and slow your recovery, so you will need to focus on controlling your diabetes during the weeks before surgery. . Make sure that the doctor who takes care of your diabetes knows about your planned surgery including the date and location.  How do I manage my blood sugar before surgery? . Check your blood sugar at least 4 times a day, starting 2 days before surgery, to make sure that the level is not too high or low. o Check your blood sugar the morning of your surgery when you wake up and every 2 hours until you get to the Short Stay unit. . If your blood sugar is less than 70 mg/dL, you will need to treat for low blood sugar: o Do  not take insulin. o Treat a low blood sugar (less than 70 mg/dL) with  cup of clear juice (cranberry or apple), 4 glucose tablets, OR glucose gel. o Recheck blood sugar in 15 minutes after treatment (to make sure it is greater than 70 mg/dL). If your blood sugar is not greater than 70 mg/dL on recheck, call [phone] for further instructions. . Report your blood sugar to the short stay nurse when you get to Short Stay.  . If you are admitted to the hospital after surgery: o Your blood sugar will be checked by the staff and you will probably be given insulin after surgery (instead of oral diabetes medicines) to make sure you have good blood sugar levels. o The goal for blood sugar control after surgery is 80-180 mg/dL  WHAT DO I DO ABOUT MY DIABETES MEDICATION?   Marland Kitchen Do not take oral diabetes medicines (pills) the morning of surgery such as metFORMIN (GLUCOPHAGE-XR)   . The day of surgery, do not take other diabetes injectables, including Byetta (exenatide), Bydureon (exenatide ER), Victoza (liraglutide), or Trulicity (dulaglutide).   Patient Signature:  Date:   Nurse Signature:  Date:   Reviewed and Endorsed by [facility] Patient Education Committee, August 2015  Do not wear jewelry, make-up or nail polish.  Do not wear lotions, powders, or perfumes, or deoderant.  Do not shave 48 hours prior to surgery.   Do not bring valuables to the hospital.  [facility] is not responsible for any belongings or valuables.  Contacts, dentures or bridgework may not be worn into surgery.  Leave your suitcase in the car.  After surgery it may be brought to your room.  For patients admitted to the hospital, discharge time will be determined by your treatment team.  Special instructions: Shower the nighty before surgery and the morning of surgery with CHG.  Please read over the following fact sheets that you were given. Pain Booklet, Coughing and Deep Breathing and Surgical Site Infection  Prevention

## 2016-02-27 NOTE — Progress Notes (Signed)
Pt denies SOB and chest pain but is under the care of Dr. Ola Spurr at [facility]. Pt stated that a stress test was performed 6 years ago, cardiac cath and echo performed January 2016 at [facility] ( in pt chart). Pt stated that a chest x ray was done recently at [facility] ( [place]). Pt denies having any recent labs. Pt stated that her fasting blood glucose usually ranges from 115-125. See anesthesia note.

## 2016-02-28 ENCOUNTER — Ambulatory Visit (HOSPITAL_COMMUNITY): Payer: PPO

## 2016-02-29 DIAGNOSIS — R609 Edema, unspecified: Secondary | ICD-10-CM | POA: Diagnosis not present

## 2016-02-29 DIAGNOSIS — N289 Disorder of kidney and ureter, unspecified: Secondary | ICD-10-CM | POA: Diagnosis not present

## 2016-02-29 DIAGNOSIS — I10 Essential (primary) hypertension: Secondary | ICD-10-CM | POA: Diagnosis not present

## 2016-03-06 DIAGNOSIS — N289 Disorder of kidney and ureter, unspecified: Secondary | ICD-10-CM | POA: Diagnosis not present

## 2016-03-06 DIAGNOSIS — R609 Edema, unspecified: Secondary | ICD-10-CM | POA: Diagnosis not present

## 2016-03-06 DIAGNOSIS — I10 Essential (primary) hypertension: Secondary | ICD-10-CM | POA: Diagnosis not present

## 2016-03-14 DIAGNOSIS — I129 Hypertensive chronic kidney disease with stage 1 through stage 4 chronic kidney disease, or unspecified chronic kidney disease: Secondary | ICD-10-CM | POA: Diagnosis not present

## 2016-03-14 DIAGNOSIS — C50412 Malignant neoplasm of upper-outer quadrant of left female breast: Secondary | ICD-10-CM | POA: Diagnosis not present

## 2016-03-14 DIAGNOSIS — L03012 Cellulitis of left finger: Secondary | ICD-10-CM | POA: Diagnosis not present

## 2016-03-14 DIAGNOSIS — E538 Deficiency of other specified B group vitamins: Secondary | ICD-10-CM | POA: Diagnosis not present

## 2016-03-14 DIAGNOSIS — N184 Chronic kidney disease, stage 4 (severe): Secondary | ICD-10-CM | POA: Diagnosis not present

## 2016-03-19 NOTE — Progress Notes (Addendum)
Anesthesia Update: Patient is a 65 year old female scheduled for left mastectomy with left sentinel lymph node biopsy, right prophylactic mastectomy on 03/21/16 by Dr. Marlou Starks. Procedure was initially scheduled for 02/28/16, but following her cardiac clearance she was seen on 02/15/16 as an add-on for volume overload/BLE edema despite a loss of 13 lbs on increased Lasix dose. An echo was ordered, but had not been done. It was felt that she should have the echocardiogram prior to undergoing breast surgery. She has since had the echocardiogram (see below). She was recently evaluated by her PCP Dr. Doug Sou on 03/14/16 who is aware of upcoming surgery.   Please see my 02/27/16 anesthesia note for additional details regarding her history and previous cardiac testing. (Normal coronaries by 05/07/14 cardiac cath.)  02/15/16 EKG [facility]): SR with first degree AV block, non-specific T wave abnormality, poor r wave progression.   02/29/16 Echo [facility]): Mild concentric LVH with normal LV systolic function. LVEF 60%. 2. Normal diastolic filling pattern, normal LAP. 3. Mild LAE. 4. Mild MAC with mild MR. 5. Normal right ventricular function.  02/05/16 2V CXR ([facility]): Findings: Lateral image is limited by body habitus and technique. The lungs are clear. No pleural effusion or pneumothorax. The heart is normal size. The osseous structures are unremarkable. Impression: No acute abnormality.  06/15/14 Polysomnogram Report ([place]): Findings: 1. Moderate OSA (AHI is 19.1) with desaturation to 63% and 0 extrasystoles. 2. Severe snoring. 3. Moderate periodic limb movement disorder. 4. No significant RLS. 5. A 4 was scored on the Epworth Sleepiness Scale. 6. Obese BMI of 50.1.  She had previously been given permission to hold Eliquis for 2 days prior to surgery.   Our PAT scheduled is attempting to get patient to come in for repeat preoperative  labs. She has known history of anemia and CKD. Her A1c was 6.5 on 01/30/16.   [facility] Short Stay Center/Anesthesiology Phone [phone] 03/19/2016 9:46 AM  Addendum: Patient reported last Eliquis dose 03/18/16. Labs are stable when compared to pre-operative labs on 02/27/16--H/H 9.8/31.1 (consistent with labs from 01/11/16 and 02/27/16) and Cr 2.18 (previously 2.04 01/30/16, 2.5 01/11/16, 2.17 02/27/16). PT/INR WNL. CBC and CMET results routed to Dr. Marlou Starks. I will defer decision for T&S to Dr. Marlou Starks and/or anesthesiologist.  Myra Gianotti, PA-C [facility] Short Stay Center/Anesthesiology Phone [phone] 03/20/2016 1:57 PM

## 2016-03-20 ENCOUNTER — Encounter (HOSPITAL_COMMUNITY)
Admission: RE | Admit: 2016-03-20 | Discharge: 2016-03-20 | Disposition: A | Discharge status: Home / Self Care | Payer: PPO | Source: Ambulatory Visit | Attending: General Surgery | Admitting: General Surgery

## 2016-03-20 ENCOUNTER — Other Ambulatory Visit (HOSPITAL_COMMUNITY): Payer: Self-pay | Admitting: *Deleted

## 2016-03-20 DIAGNOSIS — Z170 Estrogen receptor positive status [ER+]: Secondary | ICD-10-CM | POA: Diagnosis not present

## 2016-03-20 DIAGNOSIS — Z7984 Long term (current) use of oral hypoglycemic drugs: Secondary | ICD-10-CM | POA: Diagnosis not present

## 2016-03-20 DIAGNOSIS — E1122 Type 2 diabetes mellitus with diabetic chronic kidney disease: Secondary | ICD-10-CM | POA: Diagnosis not present

## 2016-03-20 DIAGNOSIS — Z87891 Personal history of nicotine dependence: Secondary | ICD-10-CM | POA: Diagnosis not present

## 2016-03-20 DIAGNOSIS — I4891 Unspecified atrial fibrillation: Secondary | ICD-10-CM | POA: Diagnosis not present

## 2016-03-20 DIAGNOSIS — D0512 Intraductal carcinoma in situ of left breast: Secondary | ICD-10-CM | POA: Diagnosis not present

## 2016-03-20 DIAGNOSIS — E079 Disorder of thyroid, unspecified: Secondary | ICD-10-CM | POA: Diagnosis not present

## 2016-03-20 DIAGNOSIS — N189 Chronic kidney disease, unspecified: Secondary | ICD-10-CM | POA: Diagnosis not present

## 2016-03-20 DIAGNOSIS — Z01812 Encounter for preprocedural laboratory examination: Secondary | ICD-10-CM | POA: Insufficient documentation

## 2016-03-20 DIAGNOSIS — F329 Major depressive disorder, single episode, unspecified: Secondary | ICD-10-CM | POA: Diagnosis not present

## 2016-03-20 DIAGNOSIS — I130 Hypertensive heart and chronic kidney disease with heart failure and stage 1 through stage 4 chronic kidney disease, or unspecified chronic kidney disease: Secondary | ICD-10-CM | POA: Diagnosis not present

## 2016-03-20 DIAGNOSIS — Z4001 Encounter for prophylactic removal of breast: Secondary | ICD-10-CM | POA: Diagnosis not present

## 2016-03-20 DIAGNOSIS — Z803 Family history of malignant neoplasm of breast: Secondary | ICD-10-CM | POA: Diagnosis not present

## 2016-03-20 DIAGNOSIS — Z7901 Long term (current) use of anticoagulants: Secondary | ICD-10-CM | POA: Diagnosis not present

## 2016-03-20 DIAGNOSIS — G4730 Sleep apnea, unspecified: Secondary | ICD-10-CM | POA: Diagnosis not present

## 2016-03-20 DIAGNOSIS — I509 Heart failure, unspecified: Secondary | ICD-10-CM | POA: Diagnosis not present

## 2016-03-20 DIAGNOSIS — Z79899 Other long term (current) drug therapy: Secondary | ICD-10-CM | POA: Diagnosis not present

## 2016-03-20 LAB — COMPREHENSIVE METABOLIC PANEL
ALK PHOS: 49 U/L (ref 38–126)
ALT: 15 U/L (ref 14–54)
AST: 19 U/L (ref 15–41)
Albumin: 3.6 g/dL (ref 3.5–5.0)
Anion gap: 11 (ref 5–15)
BUN: 29 mg/dL — AB (ref 6–20)
CALCIUM: 9.5 mg/dL (ref 8.9–10.3)
CHLORIDE: 99 mmol/L — AB (ref 101–111)
CO2: 27 mmol/L (ref 22–32)
CREATININE: 2.18 mg/dL — AB (ref 0.44–1.00)
GFR calc non Af Amer: 22 mL/min — ABNORMAL LOW (ref >60)
GFR, EST AFRICAN AMERICAN: 26 mL/min — AB (ref >60)
Glucose, Bld: 154 mg/dL — ABNORMAL HIGH (ref 65–99)
Potassium: 3.7 mmol/L (ref 3.5–5.1)
SODIUM: 137 mmol/L (ref 135–145)
Total Bilirubin: 0.6 mg/dL (ref 0.3–1.2)
Total Protein: 6.2 g/dL — ABNORMAL LOW (ref 6.5–8.1)

## 2016-03-20 LAB — CBC
HCT: 31.1 % — ABNORMAL LOW (ref 36.0–46.0)
HEMOGLOBIN: 9.8 g/dL — AB (ref 12.0–15.0)
MCH: 28.3 pg (ref 26.0–34.0)
MCHC: 31.5 g/dL (ref 30.0–36.0)
MCV: 89.9 fL (ref 78.0–100.0)
PLATELETS: 371 10*3/uL (ref 150–400)
RBC: 3.46 MIL/uL — AB (ref 3.87–5.11)
RDW: 15 % (ref 11.5–15.5)
WBC: 5.6 10*3/uL (ref 4.0–10.5)

## 2016-03-20 LAB — PROTIME-INR
INR: 1.19
Prothrombin Time: 15.1 seconds (ref 11.4–15.2)

## 2016-03-20 LAB — GLUCOSE, CAPILLARY: Glucose-Capillary: 149 mg/dL — ABNORMAL HIGH (ref 65–99)

## 2016-03-20 MED ORDER — VANCOMYCIN HCL 10 G IV SOLR
1500.0000 mg | INTRAVENOUS | Status: AC
  Administered 2016-03-21: 1500 mg via INTRAVENOUS
  Filled 2016-03-20: qty 1500

## 2016-03-20 NOTE — Pre-Procedure Instructions (Addendum)
Maureen [name]  03/20/2016    Your procedure is scheduled on Wednesday, March 21, 2016 at 1:45 PM.   Report to [facility] Entrance "A" Admitting Office at 11:45 AM.   Call this number if you have problems the morning of surgery: [phone]   Remember:  Do not eat food or drink liquids after midnight tonight  Take these medicines the morning of surgery with A SIP OF WATER: Amiodarone (Pacerone), Carvedilol (Coreg), Tramadol - if needed   How to Manage Your Diabetes Before Surgery   Why is it important to control my blood sugar before and after surgery?   Improving blood sugar levels before and after surgery helps healing and can limit problems.  A way of improving blood sugar control is eating a healthy diet by:  - Eating less sugar and carbohydrates  - Increasing activity/exercise  - Talk with your doctor about reaching your blood sugar goals  High blood sugars (greater than 180 mg/dL) can raise your risk of infections and slow down your recovery so you will need to focus on controlling your diabetes during the weeks before surgery.  Make sure that the doctor who takes care of your diabetes knows about your planned surgery including the date and location.  How do I manage my blood sugars before surgery?   Check your blood sugar at least 4 times a day, 2 days before surgery to make sure that they are not too high or low.  Check your blood sugar the morning of your surgery when you wake up and every 2 hours until you get to the Short-Stay unit.  Treat a low blood sugar (less than 70 mg/dL) with 1/2 cup of clear juice (cranberry or apple), 4 glucose tablets, OR glucose gel.  Recheck blood sugar in 15 minutes after treatment (to make sure it is greater than 70 mg/dL).  If blood sugar is not greater than 70 mg/dL on re-check, call [phone] for further instructions.   Report your blood sugar to the Short-Stay nurse when you get to Short-Stay.  References:   [facility], 2007 "How to Manage your Diabetes Before and After Surgery".  What do I do about my diabetes medications?   Do not take oral diabetes medicines (pills) the morning of surgery.   Do not take other diabetes injectables the day of surgery including Byetta, Victoza, Bydureon, and Trulicity.   Do not wear jewelry, make-up or nail polish.  Do not wear lotions, powders, perfumes, or deodorant.  Do not shave 48 hours prior to surgery.    Do not bring valuables to the hospital.  [facility] is not responsible for any belongings or valuables.  Contacts, dentures or bridgework may not be worn into surgery.  Leave your suitcase in the car.  After surgery it may be brought to your room.  For patients admitted to the hospital, discharge time will be determined by your treatment team.  Patients discharged the day of surgery will not be allowed to drive home.   Special instructions: [place] - Preparing for Surgery  Before surgery, you can play an important role.  Because skin is not sterile, your skin needs to be as free of germs as possible.  You can reduce the number of germs on you skin by washing with CHG (chlorahexidine gluconate) soap before surgery.  CHG is an antiseptic cleaner which kills germs and bonds with the skin to continue killing germs even after washing.  Please  DO NOT use if you have an allergy to CHG or antibacterial soaps.  If your skin becomes reddened/irritated stop using the CHG and inform your nurse when you arrive at Short Stay.  Do not shave (including legs and underarms) for at least 48 hours prior to the first CHG shower.  You may shave your face.  Please follow these instructions carefully:   1.  Shower with CHG Soap the night before surgery and the                    morning of Surgery.  2.  If you choose to wash your hair, wash your hair first as usual with your       normal shampoo.  3.  After you shampoo, rinse your hair  and body thoroughly to remove the shampoo.  4.  Use CHG as you would any other liquid soap.  You can apply chg directly       to the skin and wash gently with scrungie or a clean washcloth.  5.  Apply the CHG Soap to your body ONLY FROM THE NECK DOWN.        Do not use on open wounds or open sores.  Avoid contact with your eyes, ears, mouth and genitals (private parts).  Wash genitals (private parts) with your normal soap.  6.  Wash thoroughly, paying special attention to the area where your surgery        will be performed.  7.  Thoroughly rinse your body with warm water from the neck down.  8.  DO NOT shower/wash with your normal soap after using and rinsing off       the CHG Soap.  9.  Pat yourself dry with a clean towel.            10.  Wear clean pajamas.            11.  Place clean sheets on your bed the night of your first shower and do not        sleep with pets.  Day of Surgery  Do not apply any lotions/deodorants the morning of surgery.  Please wear clean clothes to the hospital.

## 2016-03-20 NOTE — Progress Notes (Signed)
Pt was brought back in for repeat labs. Surgery has been rescheduled for 03/21/16. Went over instructions with pt with new arrival time. Pt states last dose of Eliquis was 03/18/16. Pt denies any recent chest pain or sob. Pt's breathing seems heavy, but when I mentioned it to her she says she feels fine and was not aware that she was breathing heavy. Pt states her fasting blood sugar has been around 120.

## 2016-03-21 ENCOUNTER — Encounter (HOSPITAL_COMMUNITY): Payer: Self-pay | Admitting: Certified Registered Nurse Anesthetist

## 2016-03-21 ENCOUNTER — Ambulatory Visit (HOSPITAL_COMMUNITY): Payer: PPO | Admitting: Vascular Surgery

## 2016-03-21 ENCOUNTER — Encounter (HOSPITAL_COMMUNITY)
Admission: RE | Admit: 2016-03-21 | Discharge: 2016-03-21 | Disposition: A | Discharge status: Home / Self Care | Payer: PPO | Source: Ambulatory Visit | Attending: General Surgery | Admitting: General Surgery

## 2016-03-21 ENCOUNTER — Encounter (HOSPITAL_COMMUNITY)
Admission: RE | Disposition: A | Discharge status: Home / Self Care | Payer: Self-pay | Source: Ambulatory Visit | Attending: General Surgery

## 2016-03-21 ENCOUNTER — Ambulatory Visit (HOSPITAL_COMMUNITY)
Admission: RE | Admit: 2016-03-21 | Discharge: 2016-03-22 | Disposition: A | Discharge status: Home / Self Care | Payer: PPO | Source: Ambulatory Visit | Attending: General Surgery | Admitting: General Surgery

## 2016-03-21 DIAGNOSIS — E1122 Type 2 diabetes mellitus with diabetic chronic kidney disease: Secondary | ICD-10-CM | POA: Insufficient documentation

## 2016-03-21 DIAGNOSIS — D0512 Intraductal carcinoma in situ of left breast: Secondary | ICD-10-CM

## 2016-03-21 DIAGNOSIS — N6012 Diffuse cystic mastopathy of left breast: Secondary | ICD-10-CM | POA: Diagnosis not present

## 2016-03-21 DIAGNOSIS — N189 Chronic kidney disease, unspecified: Secondary | ICD-10-CM | POA: Diagnosis not present

## 2016-03-21 DIAGNOSIS — I509 Heart failure, unspecified: Secondary | ICD-10-CM | POA: Insufficient documentation

## 2016-03-21 DIAGNOSIS — I130 Hypertensive heart and chronic kidney disease with heart failure and stage 1 through stage 4 chronic kidney disease, or unspecified chronic kidney disease: Secondary | ICD-10-CM | POA: Insufficient documentation

## 2016-03-21 DIAGNOSIS — F329 Major depressive disorder, single episode, unspecified: Secondary | ICD-10-CM | POA: Diagnosis not present

## 2016-03-21 DIAGNOSIS — Z170 Estrogen receptor positive status [ER+]: Secondary | ICD-10-CM | POA: Diagnosis not present

## 2016-03-21 DIAGNOSIS — Z87891 Personal history of nicotine dependence: Secondary | ICD-10-CM | POA: Insufficient documentation

## 2016-03-21 DIAGNOSIS — G4730 Sleep apnea, unspecified: Secondary | ICD-10-CM | POA: Diagnosis not present

## 2016-03-21 DIAGNOSIS — N6011 Diffuse cystic mastopathy of right breast: Secondary | ICD-10-CM | POA: Diagnosis not present

## 2016-03-21 DIAGNOSIS — I4891 Unspecified atrial fibrillation: Secondary | ICD-10-CM | POA: Insufficient documentation

## 2016-03-21 DIAGNOSIS — Z853 Personal history of malignant neoplasm of breast: Secondary | ICD-10-CM | POA: Diagnosis not present

## 2016-03-21 DIAGNOSIS — Z7984 Long term (current) use of oral hypoglycemic drugs: Secondary | ICD-10-CM | POA: Insufficient documentation

## 2016-03-21 DIAGNOSIS — Z79899 Other long term (current) drug therapy: Secondary | ICD-10-CM | POA: Insufficient documentation

## 2016-03-21 DIAGNOSIS — Z803 Family history of malignant neoplasm of breast: Secondary | ICD-10-CM | POA: Diagnosis not present

## 2016-03-21 DIAGNOSIS — Z4001 Encounter for prophylactic removal of breast: Secondary | ICD-10-CM | POA: Diagnosis not present

## 2016-03-21 DIAGNOSIS — R921 Mammographic calcification found on diagnostic imaging of breast: Secondary | ICD-10-CM | POA: Diagnosis not present

## 2016-03-21 DIAGNOSIS — E079 Disorder of thyroid, unspecified: Secondary | ICD-10-CM | POA: Insufficient documentation

## 2016-03-21 DIAGNOSIS — C50412 Malignant neoplasm of upper-outer quadrant of left female breast: Secondary | ICD-10-CM | POA: Diagnosis not present

## 2016-03-21 DIAGNOSIS — Z7901 Long term (current) use of anticoagulants: Secondary | ICD-10-CM | POA: Insufficient documentation

## 2016-03-21 DIAGNOSIS — G8918 Other acute postprocedural pain: Secondary | ICD-10-CM | POA: Diagnosis not present

## 2016-03-21 HISTORY — PX: MASTECTOMY W/ SENTINEL NODE BIOPSY: SHX2001

## 2016-03-21 LAB — GLUCOSE, CAPILLARY
Glucose-Capillary: 130 mg/dL — ABNORMAL HIGH (ref 65–99)
Glucose-Capillary: 149 mg/dL — ABNORMAL HIGH (ref 65–99)

## 2016-03-21 SURGERY — MASTECTOMY WITH SENTINEL LYMPH NODE BIOPSY
Anesthesia: General | Site: Breast | Laterality: Bilateral

## 2016-03-21 MED ORDER — AMITRIPTYLINE HCL 50 MG PO TABS
50.0000 mg | ORAL_TABLET | Freq: Every day | ORAL | Status: DC
  Administered 2016-03-21: 50 mg via ORAL
  Filled 2016-03-21: qty 1

## 2016-03-21 MED ORDER — ONDANSETRON 4 MG PO TBDP: 4.0000 mg | ORAL_TABLET | Freq: Four times a day (QID) | ORAL | Status: DC | PRN

## 2016-03-21 MED ORDER — 0.9 % SODIUM CHLORIDE (POUR BTL) OPTIME
TOPICAL | Status: DC | PRN
  Administered 2016-03-21 (×2): 1000 mL

## 2016-03-21 MED ORDER — PROPOFOL 10 MG/ML IV BOLUS
INTRAVENOUS | Status: DC | PRN
  Administered 2016-03-21: 200 mg via INTRAVENOUS

## 2016-03-21 MED ORDER — APIXABAN 5 MG PO TABS
5.0000 mg | ORAL_TABLET | Freq: Two times a day (BID) | ORAL | Status: DC
  Administered 2016-03-22: 5 mg via ORAL
  Filled 2016-03-21: qty 1

## 2016-03-21 MED ORDER — TIZANIDINE HCL 2 MG PO TABS
8.0000 mg | ORAL_TABLET | Freq: Every day | ORAL | Status: DC
  Administered 2016-03-21: 8 mg via ORAL
  Filled 2016-03-21: qty 4

## 2016-03-21 MED ORDER — PROMETHAZINE HCL 25 MG/ML IJ SOLN
6.2500 mg | Freq: Once | INTRAMUSCULAR | Status: AC
  Administered 2016-03-21: 6.25 mg via INTRAVENOUS

## 2016-03-21 MED ORDER — TECHNETIUM TC 99M SULFUR COLLOID FILTERED
1.0000 | Freq: Once | INTRAVENOUS | Status: AC | PRN
  Administered 2016-03-21: 1 via INTRADERMAL

## 2016-03-21 MED ORDER — SODIUM CHLORIDE 0.9 % IV SOLN
INTRAVENOUS | Status: DC
Start: 2016-03-21 — End: 2016-03-22

## 2016-03-21 MED ORDER — DOXAZOSIN MESYLATE 4 MG PO TABS
4.0000 mg | ORAL_TABLET | Freq: Every day | ORAL | Status: DC
  Administered 2016-03-21: 4 mg via ORAL
  Filled 2016-03-21: qty 1

## 2016-03-21 MED ORDER — MIDAZOLAM HCL 2 MG/2ML IJ SOLN
INTRAMUSCULAR | Status: AC
  Filled 2016-03-21: qty 2

## 2016-03-21 MED ORDER — CHLORHEXIDINE GLUCONATE CLOTH 2 % EX PADS: 6.0000 | MEDICATED_PAD | Freq: Once | CUTANEOUS | Status: DC

## 2016-03-21 MED ORDER — CARVEDILOL 6.25 MG PO TABS
6.2500 mg | ORAL_TABLET | Freq: Two times a day (BID) | ORAL | Status: DC
  Administered 2016-03-21 – 2016-03-22 (×2): 6.25 mg via ORAL
  Filled 2016-03-21 (×2): qty 1

## 2016-03-21 MED ORDER — SUCCINYLCHOLINE CHLORIDE 20 MG/ML IJ SOLN
INTRAMUSCULAR | Status: DC | PRN
  Administered 2016-03-21: 80 mg via INTRAVENOUS

## 2016-03-21 MED ORDER — METFORMIN HCL ER 500 MG PO TB24: 500.0000 mg | ORAL_TABLET | Freq: Two times a day (BID) | ORAL | Status: DC

## 2016-03-21 MED ORDER — AMIODARONE HCL 200 MG PO TABS
200.0000 mg | ORAL_TABLET | Freq: Every day | ORAL | Status: DC
  Administered 2016-03-22: 200 mg via ORAL
  Filled 2016-03-21: qty 1

## 2016-03-21 MED ORDER — ROCURONIUM BROMIDE 100 MG/10ML IV SOLN
INTRAVENOUS | Status: DC | PRN
  Administered 2016-03-21: 10 mg via INTRAVENOUS
  Administered 2016-03-21: 60 mg via INTRAVENOUS

## 2016-03-21 MED ORDER — TRAMADOL HCL 50 MG PO TABS
50.0000 mg | ORAL_TABLET | Freq: Four times a day (QID) | ORAL | Status: DC | PRN
  Filled 2016-03-21: qty 1

## 2016-03-21 MED ORDER — HYDROMORPHONE HCL 1 MG/ML IJ SOLN
0.2500 mg | INTRAMUSCULAR | Status: DC | PRN
  Administered 2016-03-21 (×4): 0.5 mg via INTRAVENOUS

## 2016-03-21 MED ORDER — SODIUM CHLORIDE 0.9 % IJ SOLN
INTRAVENOUS | Status: DC | PRN
  Administered 2016-03-21: 5 mL via INTRAMUSCULAR

## 2016-03-21 MED ORDER — HYDROMORPHONE HCL 1 MG/ML IJ SOLN
INTRAMUSCULAR | Status: AC
  Filled 2016-03-21: qty 1

## 2016-03-21 MED ORDER — FUROSEMIDE 40 MG PO TABS
40.0000 mg | ORAL_TABLET | Freq: Every day | ORAL | Status: DC
  Administered 2016-03-22: 40 mg via ORAL
  Filled 2016-03-21: qty 1

## 2016-03-21 MED ORDER — LIRAGLUTIDE 18 MG/3ML ~~LOC~~ SOPN
0.6000 mg | PEN_INJECTOR | Freq: Every day | SUBCUTANEOUS | Status: DC
Start: 2016-03-21 — End: 2016-03-22

## 2016-03-21 MED ORDER — LEVOTHYROXINE SODIUM 100 MCG PO TABS
100.0000 ug | ORAL_TABLET | Freq: Every day | ORAL | Status: DC
  Administered 2016-03-22: 100 ug via ORAL
  Filled 2016-03-21: qty 1

## 2016-03-21 MED ORDER — OXYCODONE HCL 5 MG PO TABS
ORAL_TABLET | ORAL | Status: AC
  Filled 2016-03-21: qty 1

## 2016-03-21 MED ORDER — PROMETHAZINE HCL 25 MG/ML IJ SOLN
INTRAMUSCULAR | Status: AC
  Filled 2016-03-21: qty 1

## 2016-03-21 MED ORDER — ROPIVACAINE HCL 7.5 MG/ML IJ SOLN
INTRAMUSCULAR | Status: DC | PRN
  Administered 2016-03-21 (×2): 20 mL via PERINEURAL

## 2016-03-21 MED ORDER — OXYCODONE HCL 5 MG PO TABS
5.0000 mg | ORAL_TABLET | Freq: Once | ORAL | Status: AC | PRN
Start: 2016-03-21 — End: 2016-03-21
  Administered 2016-03-21: 5 mg via ORAL

## 2016-03-21 MED ORDER — FENTANYL CITRATE (PF) 100 MCG/2ML IJ SOLN
INTRAMUSCULAR | Status: DC | PRN
  Administered 2016-03-21: 150 ug via INTRAVENOUS
  Administered 2016-03-21 (×3): 50 ug via INTRAVENOUS

## 2016-03-21 MED ORDER — ZOLPIDEM TARTRATE 5 MG PO TABS
5.0000 mg | ORAL_TABLET | Freq: Every day | ORAL | Status: DC
  Administered 2016-03-21: 5 mg via ORAL
  Filled 2016-03-21: qty 1

## 2016-03-21 MED ORDER — SUGAMMADEX SODIUM 200 MG/2ML IV SOLN
INTRAVENOUS | Status: DC | PRN
  Administered 2016-03-21: 200 mg via INTRAVENOUS

## 2016-03-21 MED ORDER — LIDOCAINE HCL (CARDIAC) 20 MG/ML IV SOLN
INTRAVENOUS | Status: DC | PRN
  Administered 2016-03-21: 40 mg via INTRAVENOUS

## 2016-03-21 MED ORDER — ONDANSETRON HCL 4 MG/2ML IJ SOLN: 4.0000 mg | Freq: Four times a day (QID) | INTRAMUSCULAR | Status: DC | PRN

## 2016-03-21 MED ORDER — PHENYLEPHRINE HCL 10 MG/ML IJ SOLN
INTRAMUSCULAR | Status: DC | PRN
  Administered 2016-03-21: 10 ug/min via INTRAVENOUS

## 2016-03-21 MED ORDER — FENTANYL CITRATE (PF) 100 MCG/2ML IJ SOLN
25.0000 ug | INTRAMUSCULAR | Status: DC | PRN
  Administered 2016-03-21 – 2016-03-22 (×2): 50 ug via INTRAVENOUS
  Filled 2016-03-21 (×2): qty 2

## 2016-03-21 MED ORDER — SODIUM CHLORIDE 0.9 % IV SOLN
INTRAVENOUS | Status: DC
  Administered 2016-03-21: 50 mL/h via INTRAVENOUS

## 2016-03-21 MED ORDER — METHOCARBAMOL 500 MG PO TABS
ORAL_TABLET | ORAL | Status: AC
  Filled 2016-03-21: qty 1

## 2016-03-21 MED ORDER — LACTATED RINGERS IV SOLN
INTRAVENOUS | Status: DC | PRN
  Administered 2016-03-21 (×2): via INTRAVENOUS

## 2016-03-21 MED ORDER — FENTANYL CITRATE (PF) 100 MCG/2ML IJ SOLN
INTRAMUSCULAR | Status: AC
  Filled 2016-03-21: qty 2

## 2016-03-21 MED ORDER — HEPARIN SODIUM (PORCINE) 5000 UNIT/ML IJ SOLN: 5000.0000 [IU] | Freq: Three times a day (TID) | INTRAMUSCULAR | Status: DC

## 2016-03-21 MED ORDER — FENOFIBRATE 160 MG PO TABS
160.0000 mg | ORAL_TABLET | Freq: Every day | ORAL | Status: DC
  Administered 2016-03-21: 160 mg via ORAL
  Filled 2016-03-21: qty 1

## 2016-03-21 MED ORDER — PANTOPRAZOLE SODIUM 40 MG IV SOLR
40.0000 mg | Freq: Every day | INTRAVENOUS | Status: DC
Start: 2016-03-21 — End: 2016-03-22
  Administered 2016-03-21: 40 mg via INTRAVENOUS
  Filled 2016-03-21: qty 40

## 2016-03-21 MED ORDER — OXYCODONE HCL 5 MG/5ML PO SOLN: 5.0000 mg | Freq: Once | ORAL | Status: AC | PRN

## 2016-03-21 MED ORDER — MIDAZOLAM HCL 5 MG/5ML IJ SOLN
INTRAMUSCULAR | Status: DC | PRN
  Administered 2016-03-21: 2 mg via INTRAVENOUS
  Administered 2016-03-21 (×2): 1 mg via INTRAVENOUS

## 2016-03-21 MED ORDER — DEXAMETHASONE SODIUM PHOSPHATE 10 MG/ML IJ SOLN
INTRAMUSCULAR | Status: DC | PRN
  Administered 2016-03-21: 10 mg via INTRAVENOUS

## 2016-03-21 MED ORDER — METHOCARBAMOL 500 MG PO TABS
500.0000 mg | ORAL_TABLET | Freq: Four times a day (QID) | ORAL | Status: DC | PRN
  Administered 2016-03-21 – 2016-03-22 (×3): 500 mg via ORAL
  Filled 2016-03-21 (×2): qty 1

## 2016-03-21 SURGICAL SUPPLY — 58 items
APPLIER CLIP 9.375 MED OPEN (MISCELLANEOUS) ×9
BINDER BREAST LRG (GAUZE/BANDAGES/DRESSINGS) IMPLANT
BINDER BREAST XLRG (GAUZE/BANDAGES/DRESSINGS) IMPLANT
BINDER BREAST XXLRG (GAUZE/BANDAGES/DRESSINGS) ×3 IMPLANT
BIOPATCH RED 1 DISK 7.0 (GAUZE/BANDAGES/DRESSINGS) IMPLANT
BIOPATCH RED 1IN DISK 7.0MM (GAUZE/BANDAGES/DRESSINGS)
BLADE PHOTON ILLUMINATED (MISCELLANEOUS) ×3 IMPLANT
CANISTER SUCTION 2500CC (MISCELLANEOUS) ×3 IMPLANT
CHLORAPREP W/TINT 26ML (MISCELLANEOUS) ×3 IMPLANT
CLIP APPLIE 9.375 MED OPEN (MISCELLANEOUS) ×3 IMPLANT
CONT SPEC 4OZ CLIKSEAL STRL BL (MISCELLANEOUS) ×3 IMPLANT
COVER PROBE W GEL 5X96 (DRAPES) ×3 IMPLANT
COVER SURGICAL LIGHT HANDLE (MISCELLANEOUS) ×3 IMPLANT
DERMABOND ADVANCED (GAUZE/BANDAGES/DRESSINGS) ×6
DERMABOND ADVANCED .7 DNX12 (GAUZE/BANDAGES/DRESSINGS) ×3 IMPLANT
DEVICE DISSECT PLASMABLAD 3.0S (MISCELLANEOUS) IMPLANT
DRAIN CHANNEL 19F RND (DRAIN) ×6 IMPLANT
DRAPE CHEST BREAST 15X10 FENES (DRAPES) ×3 IMPLANT
DRSG PAD ABDOMINAL 8X10 ST (GAUZE/BANDAGES/DRESSINGS) ×3 IMPLANT
DRSG TEGADERM 4X4.75 (GAUZE/BANDAGES/DRESSINGS) IMPLANT
ELECT CAUTERY BLADE 6.4 (BLADE) ×3 IMPLANT
ELECT REM PT RETURN 9FT ADLT (ELECTROSURGICAL) ×3
ELECTRODE REM PT RTRN 9FT ADLT (ELECTROSURGICAL) ×1 IMPLANT
EVACUATOR SILICONE 100CC (DRAIN) ×6 IMPLANT
GAUZE SPONGE 4X4 12PLY STRL (GAUZE/BANDAGES/DRESSINGS) ×3 IMPLANT
GLOVE BIO SURGEON STRL SZ7.5 (GLOVE) ×3 IMPLANT
GLOVE BIOGEL PI IND STRL 6.5 (GLOVE) ×1 IMPLANT
GLOVE BIOGEL PI IND STRL 7.0 (GLOVE) ×3 IMPLANT
GLOVE BIOGEL PI INDICATOR 6.5 (GLOVE) ×2
GLOVE BIOGEL PI INDICATOR 7.0 (GLOVE) ×6
GLOVE ECLIPSE 6.5 STRL STRAW (GLOVE) ×3 IMPLANT
GLOVE SURG SS PI 7.0 STRL IVOR (GLOVE) ×6 IMPLANT
GOWN STRL REUS W/ TWL LRG LVL3 (GOWN DISPOSABLE) ×3 IMPLANT
GOWN STRL REUS W/TWL LRG LVL3 (GOWN DISPOSABLE) ×6
KIT BASIN OR (CUSTOM PROCEDURE TRAY) ×3 IMPLANT
KIT ROOM TURNOVER OR (KITS) ×3 IMPLANT
LIGHT WAVEGUIDE WIDE FLAT (MISCELLANEOUS) IMPLANT
NEEDLE 18GX1X1/2 (RX/OR ONLY) (NEEDLE) ×3 IMPLANT
NEEDLE FILTER BLUNT 18X 1/2SAF (NEEDLE) ×2
NEEDLE FILTER BLUNT 18X1 1/2 (NEEDLE) ×1 IMPLANT
NEEDLE HYPO 25GX1X1/2 BEV (NEEDLE) ×3 IMPLANT
NS IRRIG 1000ML POUR BTL (IV SOLUTION) ×3 IMPLANT
PACK GENERAL/GYN (CUSTOM PROCEDURE TRAY) ×3 IMPLANT
PAD ABD 8X10 STRL (GAUZE/BANDAGES/DRESSINGS) ×6 IMPLANT
PAD ARMBOARD 7.5X6 YLW CONV (MISCELLANEOUS) ×3 IMPLANT
PLASMABLADE 3.0S (MISCELLANEOUS)
SPECIMEN JAR X LARGE (MISCELLANEOUS) ×3 IMPLANT
SPONGE LAP 18X18 X RAY DECT (DISPOSABLE) ×3 IMPLANT
SUT ETHILON 3 0 FSL (SUTURE) ×3 IMPLANT
SUT MNCRL AB 4-0 PS2 18 (SUTURE) ×6 IMPLANT
SUT VIC AB 3-0 54X BRD REEL (SUTURE) ×1 IMPLANT
SUT VIC AB 3-0 BRD 54 (SUTURE) ×2
SUT VIC AB 3-0 SH 18 (SUTURE) ×3 IMPLANT
SYR CONTROL 10ML LL (SYRINGE) ×3 IMPLANT
TOWEL OR 17X24 6PK STRL BLUE (TOWEL DISPOSABLE) IMPLANT
TOWEL OR 17X26 10 PK STRL BLUE (TOWEL DISPOSABLE) ×3 IMPLANT
TUBE CONNECTING 12'X1/4 (SUCTIONS) ×1
TUBE CONNECTING 12X1/4 (SUCTIONS) ×2 IMPLANT

## 2016-03-21 NOTE — Interval H&P Note (Signed)
History and Physical Interval Note:  03/21/2016 2:11 PM  Maureen [name]  has presented today for surgery, with the diagnosis of LEFT BREAST DCIS  The various methods of treatment have been discussed with the patient and family. After consideration of risks, benefits and other options for treatment, the patient has consented to  Procedure(s): LEFT MASTECTOMY WITH LEFT SENTINEL LYMPH NODE BIOPSY, RIGHT PROPHYLACTIC MASTECTOMY (Bilateral) as a surgical intervention .  The patient's history has been reviewed, patient examined, no change in status, stable for surgery.  I have reviewed the patient's chart and labs.  Questions were answered to the patient's satisfaction.     TOTH III,PAUL S

## 2016-03-21 NOTE — Anesthesia Preprocedure Evaluation (Signed)
Anesthesia Evaluation  Patient identified by MRN, date of birth, ID band Patient awake    Reviewed: Allergy & Precautions, NPO status , Patient's Chart, lab work & pertinent test results  History of Anesthesia Complications Negative for: history of anesthetic complications  Airway Mallampati: III  TM Distance: >3 FB Neck ROM: Full    Dental  (+) Teeth Intact   Pulmonary sleep apnea and Continuous Positive Airway Pressure Ventilation , former smoker,    breath sounds clear to auscultation       Cardiovascular hypertension, Pt. on medications and Pt. on home beta blockers +CHF  + dysrhythmias  Rhythm:Regular     Neuro/Psych negative neurological ROS  negative psych ROS   GI/Hepatic GERD  Medicated and Controlled,(+) Hepatitis -  Endo/Other  diabetes, Type 2Hypothyroidism   Renal/GU CRFRenal disease     Musculoskeletal  (+) Arthritis , Fibromyalgia -  Abdominal   Peds  Hematology  (+) anemia ,   Anesthesia Other Findings   Reproductive/Obstetrics                             Anesthesia Physical Anesthesia Plan  ASA: III  Anesthesia Plan: General and Regional   Post-op Pain Management:    Induction: Intravenous  Airway Management Planned: Oral ETT  Additional Equipment: None  Intra-op Plan:   Post-operative Plan: Extubation in OR  Informed Consent: I have reviewed the patients History and Physical, chart, labs and discussed the procedure including the risks, benefits and alternatives for the proposed anesthesia with the patient or authorized representative who has indicated his/her understanding and acceptance.   Dental advisory given  Plan Discussed with: CRNA and Surgeon  Anesthesia Plan Comments:         Anesthesia Quick Evaluation

## 2016-03-21 NOTE — H&P (Signed)
Maureen [name]  Location: [facility] Patient #: [id number] DOB: Mar 04, 1951 Undefined / Language: Cleophus Molt / Race: White Female   History of Present Illness The patient is a 65 year old female who presents with breast cancer. We are asked to see the patient in consultation by Dr. Sondra Come to evaluate her for a new left breast dcis. The patient states that she did not have a mammogram from 2006 to 2016. Since 2016 she has had some calcifications in the upper outer quadrant of the left breast that have been followed. on her recent mammogram the calcifications increased. She has had some left breast tenderness. She denies any discharge from the nipple. The calcifications covered an area about 6cm. Part of these were biopsied and came back as DCIS. She is ER and PR positive. She denies any hormone replacement. She does have a fib and CHF and is followed by Dr. Ola Spurr.   Other Problems Anxiety Disorder Arthritis Atrial Fibrillation Back Pain Breast Cancer Cholelithiasis Chronic Renal Failure Syndrome Congestive Heart Failure Crohn's Disease Depression Diabetes Mellitus Gastroesophageal Reflux Disease General anesthesia - complications Hemorrhoids Hepatitis High blood pressure Hypercholesterolemia Inguinal Hernia Lump In Breast Oophorectomy Bilateral. Sleep Apnea Thyroid Disease Transfusion history  Past Surgical History  Breast Biopsy Left. Cataract Surgery Bilateral. Cesarean Section - Multiple Colon Polyp Removal - Colonoscopy Gallbladder Surgery - Laparoscopic Hemorrhoidectomy Hysterectomy (not due to cancer) - Complete Knee Surgery Bilateral. Laparoscopic Inguinal Hernia Surgery Right. Oral Surgery Spinal Surgery - Lower Back Tonsillectomy  Diagnostic Studies History  Colonoscopy 1-5 years ago Mammogram within last year  Medication History  No Current Medications Medications Reconciled  Social  History Alcohol use Occasional alcohol use. No caffeine use No drug use Tobacco use Former smoker.  Family History  Alcohol Abuse Father. Anesthetic complications Daughter. Arthritis Brother, Father, Mother, Sister. Breast Cancer Mother. Cerebrovascular Accident Father. Colon Cancer Family Members In General. Depression Mother. Heart Disease Family Members In General, Father. Heart disease in female family member before age 31 Hypertension Brother, Father, Mother, Sister. Migraine Headache Sister.  Pregnancy / Birth History Age at menarche 76 years. Age of menopause 51-55 Contraceptive History Intrauterine device. Gravida 3 Irregular periods Length (months) of breastfeeding 7-12 Maternal age 4-30 Para 3    Review of Systems  General Present- Fatigue. Not Present- Appetite Loss, Chills, Fever, Night Sweats, Weight Gain and Weight Loss. Skin Present- New Lesions. Not Present- Change in Wart/Mole, Dryness, Hives, Jaundice, Non-Healing Wounds, Rash and Ulcer. HEENT Present- Hearing Loss, Hoarseness, Oral Ulcers, Ringing in the Ears, Sinus Pain, Sore Throat and Wears glasses/contact lenses. Not Present- Earache, Nose Bleed, Seasonal Allergies, Visual Disturbances and Yellow Eyes. Respiratory Present- Chronic Cough, Snoring and Wheezing. Not Present- Bloody sputum and Difficulty Breathing. Breast Present- Breast Pain. Not Present- Breast Mass, Nipple Discharge and Skin Changes. Cardiovascular Present- Leg Cramps and Swelling of Extremities. Not Present- Chest Pain, Difficulty Breathing Lying Down, Palpitations, Rapid Heart Rate and Shortness of Breath. Gastrointestinal Present- Chronic diarrhea, Difficulty Swallowing and Indigestion. Not Present- Abdominal Pain, Bloating, Bloody Stool, Change in Bowel Habits, Constipation, Excessive gas, Gets full quickly at meals, Hemorrhoids, Nausea, Rectal Pain and Vomiting. Female Genitourinary Present- Frequency, Nocturia  and Urgency. Not Present- Painful Urination and Pelvic Pain. Musculoskeletal Present- Joint Pain, Muscle Pain and Muscle Weakness. Not Present- Back Pain, Joint Stiffness and Swelling of Extremities. Neurological Present- Decreased Memory. Not Present- Fainting, Headaches, Numbness, Seizures, Tingling, Tremor, Trouble walking and Weakness. Psychiatric Not Present- Anxiety, Bipolar, Change in Sleep Pattern, Depression, Fearful and  Frequent crying. Hematology Present- Blood Thinners, Easy Bruising and Excessive bleeding. Not Present- Gland problems, HIV and Persistent Infections.   Physical Exam  General Mental Status-Alert. General Appearance-Consistent with stated age. Hydration-Well hydrated. Voice-Normal.  Head and Neck Head-normocephalic, atraumatic with no lesions or palpable masses. Trachea-midline. Thyroid Gland Characteristics - normal size and consistency.  Eye Eyeball - Bilateral-Extraocular movements intact. Sclera/Conjunctiva - Bilateral-No scleral icterus.  Chest and Lung Exam Chest and lung exam reveals -quiet, even and easy respiratory effort with no use of accessory muscles and on auscultation, normal breath sounds, no adventitious sounds and normal vocal resonance. Inspection Chest Wall - Normal. Back - normal.  Breast Note: There is no palpable mass in either breast. There is no palpable axillary, supraclavicular, or cervical lymphadenopathy   Cardiovascular Cardiovascular examination reveals -normal heart sounds, regular rate and rhythm with no murmurs and normal pedal pulses bilaterally.  Abdomen Inspection Inspection of the abdomen reveals - No Hernias. Skin - Scar - no surgical scars. Palpation/Percussion Palpation and Percussion of the abdomen reveal - Soft, Non Tender, No Rebound tenderness, No Rigidity (guarding) and No hepatosplenomegaly. Auscultation Auscultation of the abdomen reveals - Bowel sounds  normal.  Neurologic Neurologic evaluation reveals -alert and oriented x 3 with no impairment of recent or remote memory. Mental Status-Normal.  Musculoskeletal Normal Exam - Left-Upper Extremity Strength Normal and Lower Extremity Strength Normal. Normal Exam - Right-Upper Extremity Strength Normal and Lower Extremity Strength Normal.  Lymphatic Head & Neck  General Head & Neck Lymphatics: Bilateral - Description - Normal. Axillary  General Axillary Region: Bilateral - Description - Normal. Tenderness - Non Tender. Femoral & Inguinal  Generalized Femoral & Inguinal Lymphatics: Bilateral - Description - Normal. Tenderness - Non Tender.    Assessment & Plan  DUCTAL CARCINOMA IN SITU (DCIS) OF LEFT BREAST (D05.12) Impression: The patient appears to have a large area of dcis in the upper outer left breast. I have discussed with her in detail the different options for treatment and at this point she is favoring mastectomy. she is actually interested in bilateral mastectomy. she will be a good candidate for sentinel node mapping. She does not seem to be interested in reconstruction. I will plan to obtain cardiac clearance prior to scheduling surgery. I have discussed with her in detail the risks and benefits of the surgery as well as some of the technical aspects and she understands and wishes to proceed Current Plans Pt Education - Breast cancer: discussed with patient and provided information. Addendum Note(Charnele Semple S. Marlou Starks MD; 01/23/2016 10:11 AM) Clarification. The ductal carcinoma in situ is on the left. She will require a left mastectomy and sentinel node mapping and a right prophylactic mastectomy.

## 2016-03-21 NOTE — Transfer of Care (Signed)
Immediate Anesthesia Transfer of Care Note  Patient: Maureen [name]  Procedure(s) Performed: Procedure(s): LEFT MASTECTOMY WITH LEFT SENTINEL LYMPH NODE BIOPSY, RIGHT PROPHYLACTIC MASTECTOMY (Bilateral)  Patient Location: PACU  Anesthesia Type:General  Level of Consciousness: awake, alert , oriented and patient cooperative  Airway & Oxygen Therapy: Patient Spontanous Breathing and Patient connected to nasal cannula oxygen  Post-op Assessment: Report given to RN, Post -op Vital signs reviewed and stable and Patient moving all extremities  Post vital signs: Reviewed and stable  Last Vitals:  Vitals:   03/21/16 1204 03/21/16 1816  BP: (!) 170/49   Pulse: 71   Resp: 20   Temp: 37.1 C (P) 36.8 C    Last Pain:  Vitals:   03/21/16 1204  TempSrc: Oral         Complications: No apparent anesthesia complications

## 2016-03-21 NOTE — Anesthesia Procedure Notes (Addendum)
Anesthesia Regional Block:  Pectoralis block  Pre-Anesthetic Checklist: ,, timeout performed, Correct Patient, Correct Site, Correct Laterality, Correct Procedure, Correct Position, site marked, Risks and benefits discussed,  Surgical consent,  Pre-op evaluation,  At surgeon's request and post-op pain management  Laterality: Left  Prep: chloraprep       Needles:  Injection technique: Single-shot  Needle Type: Echogenic Stimulator Needle          Additional Needles:  Procedures: ultrasound guided (picture in chart) Pectoralis block Narrative:  Start time: 03/21/2016 2:27 PM End time: 03/21/2016 2:30 PM Injection made incrementally with aspirations every 5 mL.  Performed by: Personally  Anesthesiologist: Aaran Enberg  Additional Notes: H+P and labs reviewed, risks and benefits discussed with patient, procedure tolerated well without complications

## 2016-03-21 NOTE — Anesthesia Procedure Notes (Signed)
Procedure Name: Intubation Date/Time: 03/21/2016 2:49 PM Performed by: Oletta Lamas Pre-anesthesia Checklist: Patient identified, Emergency Drugs available, Suction available and Patient being monitored Patient Re-evaluated:Patient Re-evaluated prior to inductionOxygen Delivery Method: Circle System Utilized Preoxygenation: Pre-oxygenation with 100% oxygen Intubation Type: IV induction Ventilation: Mask ventilation without difficulty Laryngoscope Size: Mac and 3 Grade View: Grade II Tube type: Oral Number of attempts: 1 Airway Equipment and Method: Stylet and Oral airway Placement Confirmation: ETT inserted through vocal cords under direct vision,  positive ETCO2 and breath sounds checked- equal and bilateral Secured at: 22 cm Tube secured with: Tape Dental Injury: Teeth and Oropharynx as per pre-operative assessment

## 2016-03-21 NOTE — Op Note (Signed)
03/21/2016  5:41 PM  PATIENT:  Maureen [name]  65 y.o. female  PRE-OPERATIVE DIAGNOSIS:  LEFT BREAST DCIS  POST-OPERATIVE DIAGNOSIS:  LEFT BREAST DCIS  PROCEDURE:  Procedure(s): LEFT MASTECTOMY WITH LEFT SENTINEL LYMPH NODE BIOPSY, RIGHT PROPHYLACTIC MASTECTOMY (Bilateral) Injection blue dye  SURGEON:  Surgeon(s) and Role:    * Jovita Kussmaul, MD - Primary  PHYSICIAN ASSISTANT:   ASSISTANTS: none   ANESTHESIA:   general  EBL:  Total I/O In: 1000 [I.V.:1000] Out: -   BLOOD ADMINISTERED:none  DRAINS: (2) Jackson-Pratt drain(s) with closed bulb suction in the prepectoral space   LOCAL MEDICATIONS USED:  NONE  SPECIMEN:  Source of Specimen:  bilateral mastectomies and left sentinel node  DISPOSITION OF SPECIMEN:  PATHOLOGY  COUNTS:  YES  TOURNIQUET:  * No tourniquets in log *  DICTATION: .Dragon Dictation   After informed consent was obtained the patient was brought to the operating room and placed in the supine position on the operating room table. After adequate induction of general anesthesia the patient's bilateral chest, breast, and axillary areas were prepped with ChloraPrep, allowed to dry, and draped in usual sterile manner. An appropriate timeout was performed. Early in the day patient underwent injection of 1 mCi of technetium sulfur colloid in the subareolar position on the left. At this point, 2 mL methylene blue and 3 mL of injectable saline were also injected in the subareolar position on the left breast. Attention was first turned to the right breast. An elliptical incision was made around the nipple and areola complex in order to minimize the excess skin. The incision was carried through the skin and subcutaneous tissue sharply with the photon blade. Breast hooks were used to elevate the skin flaps anteriorly towards the ceiling. Thin skin flaps were created circumferentially between the subcutaneous tissue and the breast tissue. This dissection was carried  circumferentially all the way to the chest wall. The breast was then removed from the pectoralis muscle with the pectoralis fascia. Once the breast was removed it was oriented with a stitch on the lateral skin. Hemostasis was achieved using the photon blade. The wound was irrigated with copious amounts of saline. A small incision was made near the anterior axillary line inferior to the operative bed. A tonsil clamp was placed through this opening and used to bring a 19 [place] round Blake drain into the operative bed. The drain was anchored to the skin with a 3-0 nylon stitch. The superior and inferior skin flaps were then grossly reapproximated with interrupted 3-0 Vicryl stitches. The skin was then closed with a running 4-0 Monocryl subcuticular stitch. The drain was placed to bulb suction and there was a good seal. Next the attention was turned to the left breast. A similar elliptical incision was made around the nipple and areola complex to minimize the excess skin. The incision was carried through the skin and subcutaneous tissue sharply with the photon blade. Breast hooks were used to elevate skin flaps anteriorly towards the ceiling. Thin skin flaps were created circumferentially by dissecting between the breast tissue in the subcutaneous fat. This dissection was carried circumferentially all the way to the chest wall. The breast was then removed from the pectoralis muscle with the pectoralis fascia. Once the breast was removed it was oriented with a stitch on the lateral skin and sent to pathology. The left axilla was then examined with the neoprobe set to technetium. A single hot spot was identified. This area was excised sharply with  the photon blade. Ex vivo counts on the sentinel node were approximately 150. There also appeared to be some blue discoloration to the node. There were no other hot, blue, or palpable lymph nodes in the left axilla. Next a small stab incision was made near the anterior axillary  line inferior to the operative bed. A tonsil clamp was placed through this opening and used to bring a 19 [place] round Blake drain into the operative bed. The drain was anchored to the skin with a 3-0 nylon stitch. Next the superior and inferior skin flaps were grossly reapproximated with interrupted 3-0 Vicryl stitches. The skin was then closed with a running 4-0 Monocryl subcuticular stitch. Dermabond dressings were applied. Sterile dressings and a breast binder were also applied. The drain was placed to bulb suction and there was a good seal. The patient tolerated the procedure well. At the end of the case all needle sponge and instrument counts were correct. The patient was then awakened and taken to recovery in stable condition.  PLAN OF CARE: Admit for overnight observation  PATIENT DISPOSITION:  PACU - hemodynamically stable.   Delay start of Pharmacological VTE agent (>24hrs) due to surgical blood loss or risk of bleeding: no

## 2016-03-21 NOTE — Anesthesia Postprocedure Evaluation (Signed)
Anesthesia Post Note  Patient: Maureen [name]  Procedure(s) Performed: Procedure(s) (LRB): LEFT MASTECTOMY WITH LEFT SENTINEL LYMPH NODE BIOPSY, RIGHT PROPHYLACTIC MASTECTOMY (Bilateral)  Patient location during evaluation: PACU Anesthesia Type: General Level of consciousness: awake and alert and oriented Pain management: pain level controlled Vital Signs Assessment: post-procedure vital signs reviewed and stable Respiratory status: spontaneous breathing, nonlabored ventilation, respiratory function stable and patient connected to nasal cannula oxygen Cardiovascular status: blood pressure returned to baseline and stable Postop Assessment: no signs of nausea or vomiting Anesthetic complications: no    Last Vitals:  Vitals:   03/21/16 1900 03/21/16 1915  BP: (!) 120/40 (!) 105/37  Pulse: 82 82  Resp: 19 16  Temp:      Last Pain:  Vitals:   03/21/16 1915  TempSrc:   PainSc: 4                  [name] A.

## 2016-03-21 NOTE — Anesthesia Procedure Notes (Addendum)
Anesthesia Regional Block:  Pectoralis block  Pre-Anesthetic Checklist: ,, timeout performed, Correct Patient, Correct Site, Correct Laterality, Correct Procedure, Correct Position, site marked, Risks and benefits discussed,  Surgical consent,  Pre-op evaluation,  At surgeon's request and post-op pain management  Laterality: Right  Prep: chloraprep       Needles:  Injection technique: Single-shot  Needle Type: Echogenic Stimulator Needle          Additional Needles:  Procedures: ultrasound guided (picture in chart) Pectoralis block Narrative:  Start time: 03/21/2016 2:21 PM End time: 03/21/2016 2:26 PM Injection made incrementally with aspirations every 5 mL.  Performed by: Personally  Anesthesiologist: Dickey Caamano  Additional Notes: H+P and labs reviewed, risks and benefits discussed with patient, procedure tolerated well without complications

## 2016-03-22 ENCOUNTER — Encounter (HOSPITAL_COMMUNITY): Payer: Self-pay | Admitting: General Surgery

## 2016-03-22 DIAGNOSIS — D0512 Intraductal carcinoma in situ of left breast: Secondary | ICD-10-CM | POA: Diagnosis not present

## 2016-03-22 MED ORDER — HYDROMORPHONE HCL 2 MG PO TABS: 1.0000 mg | ORAL_TABLET | ORAL | 0 refills | Status: DC | PRN

## 2016-03-22 MED ORDER — HYDROMORPHONE HCL 2 MG PO TABS
1.0000 mg | ORAL_TABLET | ORAL | Status: DC | PRN
  Administered 2016-03-22 (×2): 1 mg via ORAL
  Filled 2016-03-22 (×2): qty 1

## 2016-03-22 NOTE — Progress Notes (Signed)
Discharge home. Home discharge instruction given, no question verbalized. 

## 2016-03-22 NOTE — Progress Notes (Addendum)
1 Day Post-Op  Subjective: Complains of soreness but seems manageable  Objective: Vital signs in last 24 hours: Temp:  [97.5 F (36.4 C)-98.7 F (37.1 C)] 97.5 F (36.4 C) (11/30 0502) Pulse Rate:  [69-89] 69 (11/30 0502) Resp:  [12-20] 16 (11/30 0502) BP: (105-170)/(37-61) 128/45 (11/30 0502) SpO2:  [92 %-98 %] 98 % (11/30 0502) Weight:  [139.5 kg (307 lb 8 oz)-141.6 kg (312 lb 3.2 oz)] 141.6 kg (312 lb 3.2 oz) (11/29 2010)    Intake/Output from previous day: 11/29 0701 - 11/30 0700 In: 1881.5 [P.O.:360; I.V.:1521.5] Out: 252 [Drains:202; Blood:50] Intake/Output this shift: No intake/output data recorded.  Resp: clear to auscultation bilaterally Chest wall: skin flaps look good Cardio: regular rate and rhythm GI: soft, non-tender; bowel sounds normal; no masses,  no organomegaly  Lab Results:   Recent Labs  03/20/16 1118  WBC 5.6  HGB 9.8*  HCT 31.1*  PLT 371   BMET  Recent Labs  03/20/16 1118  NA 137  K 3.7  CL 99*  CO2 27  GLUCOSE 154*  BUN 29*  CREATININE 2.18*  CALCIUM 9.5   PT/INR  Recent Labs  03/20/16 1118  LABPROT 15.1  INR 1.19   ABG No results for input(s): PHART, HCO3 in the last 72 hours.  Invalid input(s): PCO2, PO2  Studies/Results: No results found.  Anti-infectives: Anti-infectives    Start     Dose/Rate Route Frequency Ordered Stop   03/21/16 0800  vancomycin (VANCOCIN) 1,500 mg in sodium chloride 0.9 % 500 mL IVPB     1,500 mg 250 mL/hr over 120 Minutes Intravenous To Surgery 03/20/16 1331 03/21/16 1630      Assessment/Plan: s/p Procedure(s): LEFT MASTECTOMY WITH LEFT SENTINEL LYMPH NODE BIOPSY, RIGHT PROPHYLACTIC MASTECTOMY (Bilateral) Advance diet  Teach drain care.  Discharge later this am She states only oral pain med she can tolerate is dilaudid  LOS: 0 days    TOTH III,PAUL S 03/22/2016

## 2016-03-27 NOTE — Assessment & Plan Note (Signed)
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

## 2016-03-28 ENCOUNTER — Ambulatory Visit (HOSPITAL_BASED_OUTPATIENT_CLINIC_OR_DEPARTMENT_OTHER): Payer: PPO | Admitting: Hematology and Oncology

## 2016-03-28 ENCOUNTER — Encounter: Payer: Self-pay | Admitting: Hematology and Oncology

## 2016-03-28 DIAGNOSIS — D0512 Intraductal carcinoma in situ of left breast: Secondary | ICD-10-CM | POA: Diagnosis not present

## 2016-03-28 DIAGNOSIS — Z170 Estrogen receptor positive status [ER+]: Secondary | ICD-10-CM | POA: Diagnosis not present

## 2016-03-28 DIAGNOSIS — C50412 Malignant neoplasm of upper-outer quadrant of left female breast: Secondary | ICD-10-CM

## 2016-03-28 NOTE — Progress Notes (Signed)
Patient Care Team: Nicola Girt, DO as PCP - General (Internal Medicine) [name] III, MD as Consulting Physician (General Surgery) Nicholas Lose, MD as Consulting Physician (Hematology and Oncology) Gery Pray, MD as Consulting Physician (Radiation Oncology)  DIAGNOSIS:  Encounter Diagnosis  Name Primary?  . 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 She is recovering from the surgery. She continues to have the drains and discomfort. She is improving slowly day by day.  REVIEW OF SYSTEMS:   Constitutional: Denies fevers, chills or abnormal weight loss Eyes: Denies blurriness of vision Ears, nose, mouth, throat, and face: Denies mucositis or sore throat Respiratory: Denies cough, dyspnea or wheezes Cardiovascular: Denies palpitation, chest discomfort Gastrointestinal:  Denies nausea, heartburn or change in bowel habits Skin: Denies abnormal skin rashes Lymphatics: Denies new lymphadenopathy or easy bruising Neurological:Denies numbness, tingling or new weaknesses Behavioral/Psych: Mood is stable, no new changes  Extremities: No lower extremity edema Breast: Bilateral mastectomies All other systems were reviewed with the patient and are negative.  I have reviewed the past  medical history, past surgical history, social history and family history with the patient and they are unchanged from previous note.  ALLERGIES:  is allergic to cephalexin; codeine; doxycycline; hydrocodone; penicillin g; and percodan [oxycodone-aspirin].  MEDICATIONS:  Current Outpatient Prescriptions  Medication Sig Dispense Refill  . amiodarone (PACERONE) 200 MG tablet TAKE 1 TABLET ONCE A DAY    . amitriptyline (ELAVIL) 50 MG tablet TAKE 1 TABLET BY MOUTH AT BEDTIME    . apixaban (ELIQUIS) 5 MG TABS tablet TAKE 1 TABLET EVERY 12 HOURS    . carvedilol (COREG) 6.25 MG tablet TAKE 1 TABLET BY MOUTH TWICE A DAY    . clindamycin (CLEOCIN) 300 MG capsule Take 600 mg by mouth 2 (two) times daily as needed (for dental procedures).    . Cyanocobalamin 1000 MCG/ML LIQD Inject 1,000 mcg as directed every 30 days.    Marland Kitchen doxazosin (CARDURA) 4 MG tablet Take 4 mg by mouth at bedtime.     . fenofibrate 160 MG tablet TAKE 1 TABLET BY MOUTH AT BEDTIME    . furosemide (LASIX) 40 MG tablet take 40 to 80 mgs daily based on weight gain    . HYDROmorphone (DILAUDID) 2 MG tablet Take 0.5 tablets (1 mg total) by mouth every 2 (two) hours as needed for severe pain. 30 tablet 0  . levothyroxine (SYNTHROID, LEVOTHROID) 100 MCG tablet Take 100 mcg by mouth at bedtime.    . Liraglutide (VICTOZA) 18 MG/3ML SOPN Inject 0.6 mg into the skin daily.     Marland Kitchen loperamide (IMODIUM A-D) 2 MG tablet Take 4 mg by mouth 4 (four) times daily as needed for diarrhea or loose stools.    . metFORMIN (GLUCOPHAGE-XR) 500 MG 24 hr tablet TAKE 1  TABLETS BY MOUTH TWICE A DAY    . tiZANidine (ZANAFLEX) 4 MG tablet take 8mg s by mouth at bedtime    . traMADol (ULTRAM) 50 MG tablet takes 100mg s twice daily as needed for pain    . VITAMIN D, ERGOCALCIFEROL, PO Take 500 Units by mouth 2 (two) times daily.    Marland Kitchen zolpidem (AMBIEN) 10 MG tablet TAKE 1 TABLET BY MOUTH AT BEDTIME     No current facility-administered medications for this visit.      PHYSICAL EXAMINATION: ECOG PERFORMANCE STATUS: 1 - Symptomatic but completely ambulatory  Vitals:   03/28/16 1109  BP: (!) 141/42  Pulse: 79  Resp: 19  Temp: 98 F (36.7 C)   Filed Weights   03/28/16 1109  Weight: (!) 302 lb 8 oz (137.2 kg)    GENERAL:alert, no distress and comfortable SKIN: skin color, texture, turgor are normal, no rashes or significant lesions EYES: normal, Conjunctiva are pink and non-injected, sclera clear OROPHARYNX:no exudate, no erythema and lips, buccal mucosa, and tongue normal  NECK: supple, thyroid normal size, non-tender, without nodularity LYMPH:  no palpable lymphadenopathy in the cervical, axillary or inguinal LUNGS: clear to auscultation and percussion with normal breathing effort HEART: regular rate & rhythm and no murmurs and no lower extremity edema ABDOMEN:abdomen soft, non-tender and normal bowel sounds MUSCULOSKELETAL:no cyanosis of digits and no clubbing  NEURO: alert & oriented x 3 with fluent speech, no focal motor/sensory deficits EXTREMITIES: No lower extremity edema  LABORATORY DATA:  I have reviewed the data as listed   Chemistry      Component Value Date/Time   NA 137 03/20/2016 1118   NA 141 01/11/2016 1230   K 3.7 03/20/2016 1118   K 4.2 01/11/2016 1230   CL 99 (L) 03/20/2016 1118   CO2 27 03/20/2016 1118   CO2 30 (H) 01/11/2016 1230   BUN 29 (H) 03/20/2016 1118   BUN 28.3 (H) 01/11/2016 1230   CREATININE 2.18 (H) 03/20/2016 1118   CREATININE 2.5 (H) 01/11/2016 1230      Component Value Date/Time   CALCIUM 9.5 03/20/2016 1118   CALCIUM 9.9 01/11/2016 1230   ALKPHOS 49 03/20/2016 1118   ALKPHOS 57 01/11/2016 1230   AST 19 03/20/2016 1118   AST 14 01/11/2016 1230   ALT 15 03/20/2016 1118   ALT 13 01/11/2016 1230   BILITOT 0.6 03/20/2016 1118   BILITOT 0.30 01/11/2016 1230       Lab Results  Component Value Date   WBC 5.6 03/20/2016   HGB 9.8 (L) 03/20/2016   HCT 31.1 (L) 03/20/2016   MCV 89.9  03/20/2016   PLT 371 03/20/2016   NEUTROABS 4.5 01/11/2016    ASSESSMENT & PLAN:  Breast cancer of upper-outer quadrant of left female breast ([place]) Left breast biopsy 01/04/2016 :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  Follow up annually with Dr. Marlou Starks or GYN for annual breast exams. I will be happy to see her on an as-needed basis.   No orders of the defined types were placed in this encounter.  The patient has a good understanding of the overall plan. she agrees with it. she will call with any problems that may develop before the next visit here.  Rulon Eisenmenger, MD 03/28/16

## 2016-04-01 ENCOUNTER — Inpatient Hospital Stay (HOSPITAL_COMMUNITY)
Admission: EM | Admit: 2016-04-01 | Discharge: 2016-04-05 | DRG: 907 | Disposition: A | Discharge status: Home / Self Care | Payer: PPO | Attending: General Surgery | Admitting: General Surgery

## 2016-04-01 ENCOUNTER — Encounter (HOSPITAL_COMMUNITY): Payer: Self-pay | Admitting: Emergency Medicine

## 2016-04-01 DIAGNOSIS — R112 Nausea with vomiting, unspecified: Secondary | ICD-10-CM | POA: Diagnosis not present

## 2016-04-01 DIAGNOSIS — E039 Hypothyroidism, unspecified: Secondary | ICD-10-CM | POA: Diagnosis not present

## 2016-04-01 DIAGNOSIS — I4892 Unspecified atrial flutter: Secondary | ICD-10-CM | POA: Diagnosis present

## 2016-04-01 DIAGNOSIS — D62 Acute posthemorrhagic anemia: Secondary | ICD-10-CM | POA: Diagnosis not present

## 2016-04-01 DIAGNOSIS — Z452 Encounter for adjustment and management of vascular access device: Secondary | ICD-10-CM

## 2016-04-01 DIAGNOSIS — K589 Irritable bowel syndrome without diarrhea: Secondary | ICD-10-CM | POA: Diagnosis not present

## 2016-04-01 DIAGNOSIS — R571 Hypovolemic shock: Secondary | ICD-10-CM | POA: Diagnosis present

## 2016-04-01 DIAGNOSIS — Z9842 Cataract extraction status, left eye: Secondary | ICD-10-CM

## 2016-04-01 DIAGNOSIS — Y838 Other surgical procedures as the cause of abnormal reaction of the patient, or of later complication, without mention of misadventure at the time of the procedure: Secondary | ICD-10-CM | POA: Diagnosis present

## 2016-04-01 DIAGNOSIS — N6489 Other specified disorders of breast: Secondary | ICD-10-CM | POA: Diagnosis present

## 2016-04-01 DIAGNOSIS — Z973 Presence of spectacles and contact lenses: Secondary | ICD-10-CM

## 2016-04-01 DIAGNOSIS — Z79891 Long term (current) use of opiate analgesic: Secondary | ICD-10-CM

## 2016-04-01 DIAGNOSIS — Z885 Allergy status to narcotic agent status: Secondary | ICD-10-CM

## 2016-04-01 DIAGNOSIS — N189 Chronic kidney disease, unspecified: Secondary | ICD-10-CM | POA: Diagnosis not present

## 2016-04-01 DIAGNOSIS — Z9841 Cataract extraction status, right eye: Secondary | ICD-10-CM

## 2016-04-01 DIAGNOSIS — L7632 Postprocedural hematoma of skin and subcutaneous tissue following other procedure: Principal | ICD-10-CM | POA: Diagnosis present

## 2016-04-01 DIAGNOSIS — I130 Hypertensive heart and chronic kidney disease with heart failure and stage 1 through stage 4 chronic kidney disease, or unspecified chronic kidney disease: Secondary | ICD-10-CM | POA: Diagnosis present

## 2016-04-01 DIAGNOSIS — D649 Anemia, unspecified: Secondary | ICD-10-CM | POA: Diagnosis present

## 2016-04-01 DIAGNOSIS — I959 Hypotension, unspecified: Secondary | ICD-10-CM

## 2016-04-01 DIAGNOSIS — K219 Gastro-esophageal reflux disease without esophagitis: Secondary | ICD-10-CM | POA: Diagnosis present

## 2016-04-01 DIAGNOSIS — G8918 Other acute postprocedural pain: Secondary | ICD-10-CM | POA: Diagnosis not present

## 2016-04-01 DIAGNOSIS — Z803 Family history of malignant neoplasm of breast: Secondary | ICD-10-CM

## 2016-04-01 DIAGNOSIS — G9340 Encephalopathy, unspecified: Secondary | ICD-10-CM | POA: Diagnosis present

## 2016-04-01 DIAGNOSIS — R34 Anuria and oliguria: Secondary | ICD-10-CM | POA: Diagnosis not present

## 2016-04-01 DIAGNOSIS — I4891 Unspecified atrial fibrillation: Secondary | ICD-10-CM | POA: Diagnosis not present

## 2016-04-01 DIAGNOSIS — Z6841 Body Mass Index (BMI) 40.0 and over, adult: Secondary | ICD-10-CM

## 2016-04-01 DIAGNOSIS — D0512 Intraductal carcinoma in situ of left breast: Secondary | ICD-10-CM | POA: Diagnosis present

## 2016-04-01 DIAGNOSIS — E1122 Type 2 diabetes mellitus with diabetic chronic kidney disease: Secondary | ICD-10-CM | POA: Diagnosis not present

## 2016-04-01 DIAGNOSIS — Z880 Allergy status to penicillin: Secondary | ICD-10-CM

## 2016-04-01 DIAGNOSIS — Y828 Other medical devices associated with adverse incidents: Secondary | ICD-10-CM | POA: Diagnosis present

## 2016-04-01 DIAGNOSIS — Z881 Allergy status to other antibiotic agents status: Secondary | ICD-10-CM

## 2016-04-01 DIAGNOSIS — N179 Acute kidney failure, unspecified: Secondary | ICD-10-CM | POA: Diagnosis present

## 2016-04-01 DIAGNOSIS — G4733 Obstructive sleep apnea (adult) (pediatric): Secondary | ICD-10-CM | POA: Diagnosis present

## 2016-04-01 DIAGNOSIS — I509 Heart failure, unspecified: Secondary | ICD-10-CM | POA: Diagnosis not present

## 2016-04-01 DIAGNOSIS — E876 Hypokalemia: Secondary | ICD-10-CM | POA: Diagnosis present

## 2016-04-01 DIAGNOSIS — M797 Fibromyalgia: Secondary | ICD-10-CM | POA: Diagnosis not present

## 2016-04-01 DIAGNOSIS — K760 Fatty (change of) liver, not elsewhere classified: Secondary | ICD-10-CM | POA: Diagnosis present

## 2016-04-01 DIAGNOSIS — Z7901 Long term (current) use of anticoagulants: Secondary | ICD-10-CM

## 2016-04-01 DIAGNOSIS — Z888 Allergy status to other drugs, medicaments and biological substances status: Secondary | ICD-10-CM

## 2016-04-01 DIAGNOSIS — Z9013 Acquired absence of bilateral breasts and nipples: Secondary | ICD-10-CM

## 2016-04-01 DIAGNOSIS — G4700 Insomnia, unspecified: Secondary | ICD-10-CM | POA: Diagnosis present

## 2016-04-01 DIAGNOSIS — Z7984 Long term (current) use of oral hypoglycemic drugs: Secondary | ICD-10-CM

## 2016-04-01 DIAGNOSIS — Z800 Family history of malignant neoplasm of digestive organs: Secondary | ICD-10-CM

## 2016-04-01 DIAGNOSIS — Z87891 Personal history of nicotine dependence: Secondary | ICD-10-CM

## 2016-04-01 NOTE — ED Notes (Signed)
Patient c/o left drain site bleeding.  Reinforced with ABD pads and Informed Linker, MD of patient in room.

## 2016-04-01 NOTE — ED Provider Notes (Signed)
[name] Provider Note   CSN: [id number] Arrival date & time: 04/01/16  2305 By signing my name below, I, Maureen [name], attest that this documentation has been prepared under the direction and in the presence of Maureen Hacker, MD . Electronically Signed: Dyke [name], Scribe. 04/02/2016. 12:13 AM.   History   Chief Complaint Chief Complaint  Patient presents with  . Post-op Problem    HPI [name] is a 65 y.o. female with SHx of left sided mastectomy who presents to the Emergency Department complaining of sudden onset swelling to left breast which began one hour ago. Pt had mastectomy performed by Maureen [name] on 03/21/16. She notes associated sudden onset pain to the area and bleeding from the site since yesterday.  She has noted increased Bloody drainage into her drain as well as likely drainage from the drain site.  Pt rates her pain as 9/10 in severity. Pain is exacerbated by movement.  She has bled 50 cc's into her drain in the last hour. Pt was en route to the ED d/t the bleeding when the swelling and pain began. No alleviating factors noted. Pt denies any fever or any other associated symptoms.   The history is provided by the patient. No language interpreter was used.   Past Medical History:  Diagnosis Date  . Anemia   . Arthritis   . Atrial fibrillation ([name])   . Atrial flutter ([name])   . Breast cancer of upper-outer quadrant of left female breast ([name]) 01/06/2016  . Bronchitis    with a cold  . CHF (congestive heart failure) ([facility]en [name])   . Chronic kidney disease   . Diabetes ([name])   . Edema   . Family history of adverse reaction to anesthesia    " my father was hard to put under."  . Fatty liver   . Fibromyalgia   . GERD (gastroesophageal reflux disease)   . Hepatitis A   . History of blood transfusion reaction   . Hypertension   . Hypothyroidism   . IBS (irritable bowel syndrome)   . Insomnia   . Osteoarthritis   . Sleep apnea   . Vitamin B  deficiency   . Wears glasses     Patient Active Problem List   Diagnosis Date Noted  . 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 Breast cancer of upper-outer quadrant of left female breast ([name]) 01/06/2016    Past Surgical History:  Procedure Laterality Date  . ABDOMINAL HYSTERECTOMY    . BREAST SURGERY     left breast biopsy  . CARDIAC CATHETERIZATION     05/07/14 Cardiac cath: Normal coronaries, NL LVF, EF 65% ([facility])  . CARPAL TUNNEL RELEASE    . CESAREAN SECTION    . CHOLECYSTECTOMY    . DILATION AND CURETTAGE OF UTERUS    . EYE SURGERY     strabismus and B/L cataracts  . HERNIA REPAIR    . MASTECTOMY W/ SENTINEL NODE BIOPSY Bilateral 03/21/2016   Procedure: LEFT MASTECTOMY WITH LEFT SENTINEL LYMPH NODE BIOPSY, RIGHT PROPHYLACTIC MASTECTOMY;  Surgeon: Maureen [name] III, MD;  Location: [name];  Service: General;  Laterality: Bilateral;  . TONSILLECTOMY    . TRANSTHORACIC ECHOCARDIOGRAM     05/06/14 TTE [name]): Mild concentric LVH, LVEF prob low normal (diff to eval due to rapid AF), mild MR.    OB History    No data available      Home Medications  Prior to Admission medications   Medication Sig Start Date End Date Taking? Authorizing Provider  amiodarone (PACERONE) 200 MG tablet TAKE 1 TABLET ONCE A DAY 09/30/14  Yes Historical Provider, MD  amitriptyline (ELAVIL) 50 MG tablet TAKE 1 TABLET BY MOUTH AT BEDTIME 12/15/15  Yes Historical Provider, MD  apixaban (ELIQUIS) 5 MG TABS tablet TAKE 1 TABLET EVERY 12 HOURS 10/07/14  Yes Historical Provider, MD  carvedilol (COREG) 6.25 MG tablet TAKE 1 TABLET BY MOUTH TWICE A DAY 11/23/15  Yes Historical Provider, MD  Cyanocobalamin 1000 MCG/ML LIQD Inject 1,000 mcg as directed every 30 days. 11/15/15  Yes Historical Provider, MD  doxazosin (CARDURA) 4 MG tablet Take 4 mg by mouth at bedtime.  05/06/15  Yes Historical Provider, MD  fenofibrate 160 MG tablet TAKE 1 TABLET BY MOUTH AT BEDTIME  11/23/15  Yes Historical Provider, MD  furosemide (LASIX) 40 MG tablet take 40 to 80 mgs daily based on weight gain 09/25/14  Yes Historical Provider, MD  HYDROmorphone (DILAUDID) 2 MG tablet Take 0.5 tablets (1 mg total) by mouth every 2 (two) hours as needed for severe pain. 03/22/16  Yes Maureen [name] III, MD  levothyroxine (SYNTHROID, LEVOTHROID) 100 MCG tablet Take 100 mcg by mouth at bedtime.   Yes Historical Provider, MD  Liraglutide (VICTOZA) 18 MG/3ML SOPN Inject 0.6 mg into the skin daily.  08/11/14  Yes Historical Provider, MD  loperamide (IMODIUM A-D) 2 MG tablet Take 4 mg by mouth 4 (four) times daily as needed for diarrhea or loose stools.   Yes Historical Provider, MD  metFORMIN (GLUCOPHAGE-XR) 500 MG 24 hr tablet TAKE 1 TABLETS BY MOUTH TWICE A DAY 12/20/15  Yes Historical Provider, MD  tiZANidine (ZANAFLEX) 4 MG tablet take 8mg s by mouth at bedtime 12/01/15  Yes Historical Provider, MD  traMADol (ULTRAM) 50 MG tablet takes 100mg s twice daily as needed for pain 12/27/15  Yes Historical Provider, MD  VITAMIN D, ERGOCALCIFEROL, PO Take 500 Units by mouth 2 (two) times daily.   Yes Historical Provider, MD  zolpidem (AMBIEN) 10 MG tablet TAKE 1 TABLET BY MOUTH AT BEDTIME 10/18/15  Yes Historical Provider, MD  clindamycin (CLEOCIN) 300 MG capsule Take 600 mg by mouth 2 (two) times daily as needed (for dental procedures).    Historical Provider, MD    Family History Family History  Problem Relation Age of Onset  . Breast cancer Mother   . Colon cancer Maternal Grandmother     Social History Social History  Substance Use Topics  . Smoking status: Former Smoker    Types: Cigarettes    Quit date: 07/28/2006  . Smokeless tobacco: Never Used  . Alcohol use No     Allergies   Cephalexin; Codeine; Doxycycline; Hydrocodone; Penicillin g; and Percodan [oxycodone-aspirin]   Review of Systems Review of Systems  Constitutional: Negative for fever.  Respiratory: Negative for shortness of breath.    Cardiovascular: Positive for chest pain.  Musculoskeletal:       + Swelling to left chest  Skin: Positive for wound.  All other systems reviewed and are negative.  Physical Exam Updated Vital Signs BP 157/68 (BP Location: Right Arm)   Pulse 87   Temp 97.8 F (36.6 C) (Oral)   Resp 18   Ht 5\' 4"  (1.626 m)   Wt (!) 303 lb (137.4 kg)   SpO2 95%   BMI 52.01 kg/m   Physical Exam  Constitutional: She is oriented to person, place, and time. She appears well-developed and  well-nourished.  Abdomen awake, uncomfortable appearing  HENT:  Head: Normocephalic and atraumatic.  Cardiovascular: Normal rate, regular rhythm and normal heart sounds.   Pulmonary/Chest: Effort normal and breath sounds normal. No respiratory distress. She has no wheezes.  Bilateral mastectomy scars clean dry and intact, no drainage from the surgical scars, there is tense swelling of the left pectoralis region with tenderness to palpation, swelling is approximately 4-5x pounds of the right, there is 50 mL of frank blood in the drain as well as bloody drainage from the drain site, no overlying erythema or fluctuance  Abdominal: Soft. Bowel sounds are normal. There is no tenderness. There is no guarding.  Neurological: She is alert and oriented to person, place, and time.  Skin: Skin is warm and dry.  Psychiatric: She has a normal mood and affect.  Nursing note and vitals reviewed.   ED Treatments / Results  DIAGNOSTIC STUDIES:  Oxygen Saturation is 95% on RA, adequate by my interpretation.    COORDINATION OF CARE:  12:07 AM Discussed treatment plan with pt at bedside and pt agreed to plan.   Labs (all labs ordered are listed, but only abnormal results are displayed) Labs Reviewed  CBC WITH DIFFERENTIAL/PLATELET  I-STAT CHEM 8, ED    EKG  EKG Interpretation None       Radiology No results found.  Procedures Procedures (including critical care time)  Medications Ordered in ED Medications    HYDROmorphone (DILAUDID) injection 1 mg (not administered)  ondansetron (ZOFRAN) injection 4 mg (not administered)     Initial Impression / Assessment and Plan / ED Course  I have reviewed the triage vital signs and the nursing notes.  Pertinent labs & imaging results that were available during my care of the patient were reviewed by me and considered in my medical decision making (see chart for details).  Clinical Course    Patient presents with left sided swelling and increased bloody drainage from prior mastectomy site. Suspicion for increasing hematoma given bloody drainage and tense swelling as well as timeframe. Low suspicion at this time for infection.   Basic lab work obtained. Pain medication ordered. Discussed with Dr. Leighton Ruff, general surgery. No indication for imaging at this time. Will admit for Dr. Marlou Starks to evaluate in the morning. Dr. Marcello Moores felt like this would likely self tamponade. No indication for further compressive dressings as patient is not tolerating 2/2 pain.  Final Clinical Impressions(s) / ED Diagnoses   Final diagnoses:  Post-operative pain   New Prescriptions New Prescriptions   No medications on file   I personally performed the services described in this documentation, which was scribed in my presence. The recorded information has been reviewed and is accurate.    Maureen Hacker, MD 04/02/16 [phone]

## 2016-04-01 NOTE — ED Triage Notes (Signed)
Pt reports having left sided mastectomy on Nov 29 and then today began having swelling to left breast and no drainage into drainage tubes.

## 2016-04-02 ENCOUNTER — Encounter (HOSPITAL_COMMUNITY)
Admission: EM | Disposition: A | Discharge status: Home / Self Care | Payer: Self-pay | Source: Home / Self Care | Attending: General Surgery

## 2016-04-02 ENCOUNTER — Observation Stay (HOSPITAL_COMMUNITY): Payer: PPO | Admitting: Anesthesiology

## 2016-04-02 ENCOUNTER — Encounter (HOSPITAL_COMMUNITY): Payer: Self-pay | Admitting: *Deleted

## 2016-04-02 ENCOUNTER — Inpatient Hospital Stay (HOSPITAL_COMMUNITY): Payer: PPO

## 2016-04-02 DIAGNOSIS — R34 Anuria and oliguria: Secondary | ICD-10-CM | POA: Diagnosis not present

## 2016-04-02 DIAGNOSIS — N189 Chronic kidney disease, unspecified: Secondary | ICD-10-CM | POA: Diagnosis not present

## 2016-04-02 DIAGNOSIS — D0512 Intraductal carcinoma in situ of left breast: Secondary | ICD-10-CM | POA: Diagnosis not present

## 2016-04-02 DIAGNOSIS — K219 Gastro-esophageal reflux disease without esophagitis: Secondary | ICD-10-CM | POA: Diagnosis not present

## 2016-04-02 DIAGNOSIS — I130 Hypertensive heart and chronic kidney disease with heart failure and stage 1 through stage 4 chronic kidney disease, or unspecified chronic kidney disease: Secondary | ICD-10-CM | POA: Diagnosis not present

## 2016-04-02 DIAGNOSIS — Z452 Encounter for adjustment and management of vascular access device: Secondary | ICD-10-CM

## 2016-04-02 DIAGNOSIS — S20212A Contusion of left front wall of thorax, initial encounter: Secondary | ICD-10-CM | POA: Diagnosis not present

## 2016-04-02 DIAGNOSIS — M797 Fibromyalgia: Secondary | ICD-10-CM | POA: Diagnosis not present

## 2016-04-02 DIAGNOSIS — E039 Hypothyroidism, unspecified: Secondary | ICD-10-CM | POA: Diagnosis not present

## 2016-04-02 DIAGNOSIS — N6489 Other specified disorders of breast: Secondary | ICD-10-CM | POA: Diagnosis not present

## 2016-04-02 DIAGNOSIS — D62 Acute posthemorrhagic anemia: Secondary | ICD-10-CM | POA: Diagnosis not present

## 2016-04-02 DIAGNOSIS — G9340 Encephalopathy, unspecified: Secondary | ICD-10-CM | POA: Diagnosis not present

## 2016-04-02 DIAGNOSIS — Y838 Other surgical procedures as the cause of abnormal reaction of the patient, or of later complication, without mention of misadventure at the time of the procedure: Secondary | ICD-10-CM | POA: Diagnosis not present

## 2016-04-02 DIAGNOSIS — I959 Hypotension, unspecified: Secondary | ICD-10-CM | POA: Diagnosis not present

## 2016-04-02 DIAGNOSIS — I509 Heart failure, unspecified: Secondary | ICD-10-CM | POA: Diagnosis not present

## 2016-04-02 DIAGNOSIS — R571 Hypovolemic shock: Secondary | ICD-10-CM | POA: Diagnosis not present

## 2016-04-02 DIAGNOSIS — G4733 Obstructive sleep apnea (adult) (pediatric): Secondary | ICD-10-CM | POA: Diagnosis not present

## 2016-04-02 DIAGNOSIS — K589 Irritable bowel syndrome without diarrhea: Secondary | ICD-10-CM | POA: Diagnosis not present

## 2016-04-02 DIAGNOSIS — I110 Hypertensive heart disease with heart failure: Secondary | ICD-10-CM | POA: Diagnosis not present

## 2016-04-02 DIAGNOSIS — Y828 Other medical devices associated with adverse incidents: Secondary | ICD-10-CM | POA: Diagnosis not present

## 2016-04-02 DIAGNOSIS — I4892 Unspecified atrial flutter: Secondary | ICD-10-CM | POA: Diagnosis not present

## 2016-04-02 DIAGNOSIS — I953 Hypotension of hemodialysis: Secondary | ICD-10-CM | POA: Diagnosis not present

## 2016-04-02 DIAGNOSIS — N179 Acute kidney failure, unspecified: Secondary | ICD-10-CM | POA: Diagnosis not present

## 2016-04-02 DIAGNOSIS — I4891 Unspecified atrial fibrillation: Secondary | ICD-10-CM | POA: Diagnosis not present

## 2016-04-02 DIAGNOSIS — R112 Nausea with vomiting, unspecified: Secondary | ICD-10-CM | POA: Diagnosis not present

## 2016-04-02 DIAGNOSIS — D649 Anemia, unspecified: Secondary | ICD-10-CM | POA: Diagnosis present

## 2016-04-02 DIAGNOSIS — E1122 Type 2 diabetes mellitus with diabetic chronic kidney disease: Secondary | ICD-10-CM | POA: Diagnosis not present

## 2016-04-02 DIAGNOSIS — E119 Type 2 diabetes mellitus without complications: Secondary | ICD-10-CM | POA: Diagnosis not present

## 2016-04-02 DIAGNOSIS — E876 Hypokalemia: Secondary | ICD-10-CM | POA: Diagnosis not present

## 2016-04-02 DIAGNOSIS — K760 Fatty (change of) liver, not elsewhere classified: Secondary | ICD-10-CM | POA: Diagnosis present

## 2016-04-02 DIAGNOSIS — L7632 Postprocedural hematoma of skin and subcutaneous tissue following other procedure: Secondary | ICD-10-CM | POA: Diagnosis not present

## 2016-04-02 DIAGNOSIS — Z6841 Body Mass Index (BMI) 40.0 and over, adult: Secondary | ICD-10-CM | POA: Diagnosis not present

## 2016-04-02 DIAGNOSIS — G8918 Other acute postprocedural pain: Secondary | ICD-10-CM | POA: Diagnosis not present

## 2016-04-02 HISTORY — PX: EVACUATION BREAST HEMATOMA: SHX1537

## 2016-04-02 LAB — CBC WITH DIFFERENTIAL/PLATELET
BASOS PCT: 0 %
Basophils Absolute: 0 10*3/uL (ref 0.0–0.1)
EOS ABS: 0.4 10*3/uL (ref 0.0–0.7)
Eosinophils Relative: 4 %
HEMATOCRIT: 25 % — AB (ref 36.0–46.0)
Hemoglobin: 8.1 g/dL — ABNORMAL LOW (ref 12.0–15.0)
Lymphocytes Relative: 14 %
Lymphs Abs: 1.2 10*3/uL (ref 0.7–4.0)
MCH: 29.2 pg (ref 26.0–34.0)
MCHC: 32.4 g/dL (ref 30.0–36.0)
MCV: 90.3 fL (ref 78.0–100.0)
MONO ABS: 0.5 10*3/uL (ref 0.1–1.0)
MONOS PCT: 6 %
Neutro Abs: 6.6 10*3/uL (ref 1.7–7.7)
Neutrophils Relative %: 76 %
Platelets: 405 10*3/uL — ABNORMAL HIGH (ref 150–400)
RBC: 2.77 MIL/uL — ABNORMAL LOW (ref 3.87–5.11)
RDW: 14.7 % (ref 11.5–15.5)
WBC: 8.7 10*3/uL (ref 4.0–10.5)

## 2016-04-02 LAB — BASIC METABOLIC PANEL
Anion gap: 9 (ref 5–15)
BUN: 39 mg/dL — ABNORMAL HIGH (ref 6–20)
CHLORIDE: 98 mmol/L — AB (ref 101–111)
CO2: 29 mmol/L (ref 22–32)
CREATININE: 2.28 mg/dL — AB (ref 0.44–1.00)
Calcium: 9.1 mg/dL (ref 8.9–10.3)
GFR calc Af Amer: 25 mL/min — ABNORMAL LOW (ref >60)
GFR calc non Af Amer: 21 mL/min — ABNORMAL LOW (ref >60)
Glucose, Bld: 228 mg/dL — ABNORMAL HIGH (ref 65–99)
POTASSIUM: 3.9 mmol/L (ref 3.5–5.1)
SODIUM: 136 mmol/L (ref 135–145)

## 2016-04-02 LAB — CBC
HCT: 27.2 % — ABNORMAL LOW (ref 36.0–46.0)
HCT: 23.3 % — ABNORMAL LOW (ref 36.0–46.0)
HEMATOCRIT: 20.5 % — AB (ref 36.0–46.0)
Hemoglobin: 6.7 g/dL — CL (ref 12.0–15.0)
Hemoglobin: 9.4 g/dL — ABNORMAL LOW (ref 12.0–15.0)
Hemoglobin: 8 g/dL — ABNORMAL LOW (ref 12.0–15.0)
MCH: 28.6 pg (ref 26.0–34.0)
MCH: 29.7 pg (ref 26.0–34.0)
MCH: 29.3 pg (ref 26.0–34.0)
MCHC: 32.7 g/dL (ref 30.0–36.0)
MCHC: 34.6 g/dL (ref 30.0–36.0)
MCHC: 34.3 g/dL (ref 30.0–36.0)
MCV: 87.6 fL (ref 78.0–100.0)
MCV: 86.1 fL (ref 78.0–100.0)
MCV: 85.3 fL (ref 78.0–100.0)
PLATELETS: 284 10*3/uL (ref 150–400)
PLATELETS: 272 10*3/uL (ref 150–400)
Platelets: 414 10*3/uL — ABNORMAL HIGH (ref 150–400)
RBC: 2.34 MIL/uL — ABNORMAL LOW (ref 3.87–5.11)
RBC: 3.16 MIL/uL — AB (ref 3.87–5.11)
RBC: 2.73 MIL/uL — ABNORMAL LOW (ref 3.87–5.11)
RDW: 14.4 % (ref 11.5–15.5)
RDW: 14.1 % (ref 11.5–15.5)
RDW: 14.7 % (ref 11.5–15.5)
WBC: 10.5 10*3/uL (ref 4.0–10.5)
WBC: 16.5 10*3/uL — ABNORMAL HIGH (ref 4.0–10.5)
WBC: 14.3 10*3/uL — AB (ref 4.0–10.5)

## 2016-04-02 LAB — GLUCOSE, CAPILLARY
GLUCOSE-CAPILLARY: 340 mg/dL — AB (ref 65–99)
GLUCOSE-CAPILLARY: 276 mg/dL — AB (ref 65–99)
GLUCOSE-CAPILLARY: 159 mg/dL — AB (ref 65–99)
GLUCOSE-CAPILLARY: 116 mg/dL — AB (ref 65–99)
Glucose-Capillary: 117 mg/dL — ABNORMAL HIGH (ref 65–99)
Glucose-Capillary: 154 mg/dL — ABNORMAL HIGH (ref 65–99)
Glucose-Capillary: 160 mg/dL — ABNORMAL HIGH (ref 65–99)

## 2016-04-02 LAB — PROTIME-INR
INR: 1.77
PROTHROMBIN TIME: 20.8 s — AB (ref 11.4–15.2)

## 2016-04-02 LAB — POCT I-STAT, CHEM 8
BUN: 32 mg/dL — ABNORMAL HIGH (ref 6–20)
CALCIUM ION: 1.24 mmol/L (ref 1.15–1.40)
Chloride: 96 mmol/L — ABNORMAL LOW (ref 101–111)
Creatinine, Ser: 2.4 mg/dL — ABNORMAL HIGH (ref 0.44–1.00)
Glucose, Bld: 223 mg/dL — ABNORMAL HIGH (ref 65–99)
HCT: 25 % — ABNORMAL LOW (ref 36.0–46.0)
HEMOGLOBIN: 8.5 g/dL — AB (ref 12.0–15.0)
POTASSIUM: 4 mmol/L (ref 3.5–5.1)
Sodium: 137 mmol/L (ref 135–145)
TCO2: 28 mmol/L (ref 0–100)

## 2016-04-02 LAB — TROPONIN I
TROPONIN I: 0.11 ng/mL — AB (ref <0.03)
TROPONIN I: 0.09 ng/mL — AB (ref <0.03)

## 2016-04-02 LAB — MRSA PCR SCREENING: MRSA by PCR: NEGATIVE

## 2016-04-02 LAB — PREPARE RBC (CROSSMATCH)

## 2016-04-02 SURGERY — EVACUATION, HEMATOMA, BREAST
Anesthesia: General | Laterality: Left

## 2016-04-02 MED ORDER — ONDANSETRON HCL 4 MG/2ML IJ SOLN
4.0000 mg | Freq: Once | INTRAMUSCULAR | Status: AC
  Administered 2016-04-02: 4 mg via INTRAVENOUS
  Filled 2016-04-02: qty 2

## 2016-04-02 MED ORDER — SODIUM CHLORIDE 0.9 % IV SOLN
Freq: Once | INTRAVENOUS | Status: AC
  Administered 2016-04-02: 18:00:00 via INTRAVENOUS

## 2016-04-02 MED ORDER — THROMBIN 20000 UNITS EX SOLR
20000.0000 [IU] | Freq: Once | CUTANEOUS | Status: DC
  Filled 2016-04-02: qty 20000

## 2016-04-02 MED ORDER — SODIUM CHLORIDE 0.9% FLUSH: 10.0000 mL | INTRAVENOUS | Status: DC | PRN

## 2016-04-02 MED ORDER — ENOXAPARIN SODIUM 40 MG/0.4ML ~~LOC~~ SOLN
40.0000 mg | Freq: Every day | SUBCUTANEOUS | Status: DC
  Filled 2016-04-02: qty 0.4

## 2016-04-02 MED ORDER — DOXAZOSIN MESYLATE 1 MG PO TABS
4.0000 mg | ORAL_TABLET | Freq: Every day | ORAL | Status: DC
  Filled 2016-04-02: qty 1

## 2016-04-02 MED ORDER — PHENYLEPHRINE HCL 10 MG/ML IJ SOLN
INTRAVENOUS | Status: DC | PRN
  Administered 2016-04-02: 50 ug/min via INTRAVENOUS

## 2016-04-02 MED ORDER — HYDROMORPHONE HCL 2 MG PO TABS: 1.0000 mg | ORAL_TABLET | ORAL | Status: DC | PRN

## 2016-04-02 MED ORDER — CARVEDILOL 6.25 MG PO TABS: 6.2500 mg | ORAL_TABLET | Freq: Two times a day (BID) | ORAL | Status: DC

## 2016-04-02 MED ORDER — ONDANSETRON HCL 4 MG/2ML IJ SOLN
4.0000 mg | Freq: Four times a day (QID) | INTRAMUSCULAR | Status: DC | PRN
  Administered 2016-04-02: 4 mg via INTRAVENOUS
  Filled 2016-04-02: qty 2

## 2016-04-02 MED ORDER — LIDOCAINE 2% (20 MG/ML) 5 ML SYRINGE
INTRAMUSCULAR | Status: DC | PRN
  Administered 2016-04-02: 100 mg via INTRAVENOUS

## 2016-04-02 MED ORDER — DIPHENHYDRAMINE HCL 50 MG/ML IJ SOLN
INTRAMUSCULAR | Status: AC
  Administered 2016-04-02: 12.5 mg
  Filled 2016-04-02: qty 1

## 2016-04-02 MED ORDER — SODIUM CHLORIDE 0.9 % IV BOLUS (SEPSIS)
1000.0000 mL | Freq: Once | INTRAVENOUS | Status: AC
  Administered 2016-04-02: 1000 mL via INTRAVENOUS

## 2016-04-02 MED ORDER — SODIUM CHLORIDE 0.9 % IV SOLN: Freq: Once | INTRAVENOUS | Status: DC

## 2016-04-02 MED ORDER — HYDROMORPHONE HCL 1 MG/ML IJ SOLN
0.5000 mg | INTRAMUSCULAR | Status: DC | PRN
  Administered 2016-04-02 – 2016-04-03 (×4): 1 mg via INTRAVENOUS
  Filled 2016-04-02 (×4): qty 1

## 2016-04-02 MED ORDER — VANCOMYCIN HCL IN DEXTROSE 1-5 GM/200ML-% IV SOLN
INTRAVENOUS | Status: AC
  Filled 2016-04-02: qty 200

## 2016-04-02 MED ORDER — FUROSEMIDE 40 MG PO TABS: 40.0000 mg | ORAL_TABLET | Freq: Every day | ORAL | Status: DC

## 2016-04-02 MED ORDER — SODIUM CHLORIDE 0.9 % IV SOLN
INTRAVENOUS | Status: DC
  Administered 2016-04-02: 10:00:00 via INTRAVENOUS

## 2016-04-02 MED ORDER — FENOFIBRATE 160 MG PO TABS
160.0000 mg | ORAL_TABLET | Freq: Every day | ORAL | Status: DC
  Filled 2016-04-02: qty 1

## 2016-04-02 MED ORDER — VANCOMYCIN HCL 1000 MG IV SOLR
1000.0000 mg | Freq: Once | INTRAVENOUS | Status: AC
  Administered 2016-04-02: 1000 mg via INTRAVENOUS

## 2016-04-02 MED ORDER — AMIODARONE HCL 200 MG PO TABS
200.0000 mg | ORAL_TABLET | Freq: Every day | ORAL | Status: DC
  Administered 2016-04-02 – 2016-04-05 (×4): 200 mg via ORAL
  Filled 2016-04-02 (×5): qty 1

## 2016-04-02 MED ORDER — HYDROMORPHONE HCL 1 MG/ML IJ SOLN: 1.0000 mg | INTRAMUSCULAR | Status: DC | PRN

## 2016-04-02 MED ORDER — LEVOTHYROXINE SODIUM 100 MCG PO TABS
100.0000 ug | ORAL_TABLET | Freq: Every day | ORAL | Status: DC
  Administered 2016-04-02 – 2016-04-04 (×3): 100 ug via ORAL
  Filled 2016-04-02 (×3): qty 1

## 2016-04-02 MED ORDER — PHENYLEPHRINE HCL 10 MG/ML IJ SOLN
INTRAMUSCULAR | Status: DC | PRN
  Administered 2016-04-02: 80 ug via INTRAVENOUS

## 2016-04-02 MED ORDER — HYDROMORPHONE HCL 1 MG/ML IJ SOLN: 0.5000 mg | INTRAMUSCULAR | Status: DC | PRN

## 2016-04-02 MED ORDER — POTASSIUM CHLORIDE IN NACL 20-0.45 MEQ/L-% IV SOLN
INTRAVENOUS | Status: DC
  Administered 2016-04-02 (×2): via INTRAVENOUS
  Filled 2016-04-02 (×2): qty 1000

## 2016-04-02 MED ORDER — FUROSEMIDE 10 MG/ML IJ SOLN
40.0000 mg | Freq: Once | INTRAMUSCULAR | Status: AC
  Administered 2016-04-02: 40 mg via INTRAVENOUS
  Filled 2016-04-02: qty 4

## 2016-04-02 MED ORDER — ONDANSETRON HCL 4 MG/2ML IJ SOLN
2.0000 mg | INTRAMUSCULAR | Status: DC | PRN
  Administered 2016-04-02: 4 mg via INTRAVENOUS
  Filled 2016-04-02: qty 2

## 2016-04-02 MED ORDER — HYDROMORPHONE HCL 2 MG/ML IJ SOLN
2.0000 mg | Freq: Once | INTRAMUSCULAR | Status: AC
  Administered 2016-04-02: 2 mg via INTRAVENOUS
  Filled 2016-04-02: qty 1

## 2016-04-02 MED ORDER — LOPERAMIDE HCL 2 MG PO CAPS
4.0000 mg | ORAL_CAPSULE | Freq: Four times a day (QID) | ORAL | Status: DC | PRN
  Administered 2016-04-02 – 2016-04-05 (×2): 4 mg via ORAL
  Filled 2016-04-02 (×2): qty 2

## 2016-04-02 MED ORDER — PROPOFOL 10 MG/ML IV BOLUS
INTRAVENOUS | Status: DC | PRN
  Administered 2016-04-02: 100 mg via INTRAVENOUS

## 2016-04-02 MED ORDER — HYDROMORPHONE HCL 2 MG/ML IJ SOLN: 0.5000 mg | INTRAMUSCULAR | Status: DC | PRN

## 2016-04-02 MED ORDER — LACTATED RINGERS IV SOLN
INTRAVENOUS | Status: DC | PRN
  Administered 2016-04-02: 10:00:00 via INTRAVENOUS

## 2016-04-02 MED ORDER — MIDAZOLAM HCL 5 MG/5ML IJ SOLN
INTRAMUSCULAR | Status: DC | PRN
  Administered 2016-04-02: 2 mg via INTRAVENOUS

## 2016-04-02 MED ORDER — DIPHENHYDRAMINE HCL 50 MG/ML IJ SOLN: 12.5000 mg | Freq: Four times a day (QID) | INTRAMUSCULAR | Status: DC | PRN

## 2016-04-02 MED ORDER — LIRAGLUTIDE 18 MG/3ML ~~LOC~~ SOPN: 0.6000 mg | PEN_INJECTOR | Freq: Every day | SUBCUTANEOUS | Status: DC

## 2016-04-02 MED ORDER — AMITRIPTYLINE HCL 25 MG PO TABS
50.0000 mg | ORAL_TABLET | Freq: Every day | ORAL | Status: DC
  Filled 2016-04-02: qty 1

## 2016-04-02 MED ORDER — FENTANYL CITRATE (PF) 100 MCG/2ML IJ SOLN: 25.0000 ug | INTRAMUSCULAR | Status: DC | PRN

## 2016-04-02 MED ORDER — ONDANSETRON 4 MG PO TBDP
4.0000 mg | ORAL_TABLET | Freq: Four times a day (QID) | ORAL | Status: DC | PRN
  Administered 2016-04-02: 4 mg via ORAL
  Filled 2016-04-02: qty 1

## 2016-04-02 MED ORDER — ZOLPIDEM TARTRATE 5 MG PO TABS
5.0000 mg | ORAL_TABLET | Freq: Once | ORAL | Status: AC
  Administered 2016-04-02: 5 mg via ORAL
  Filled 2016-04-02: qty 1

## 2016-04-02 MED ORDER — ROCURONIUM BROMIDE 10 MG/ML (PF) SYRINGE
PREFILLED_SYRINGE | INTRAVENOUS | Status: DC | PRN
  Administered 2016-04-02: 40 mg via INTRAVENOUS

## 2016-04-02 MED ORDER — FENTANYL CITRATE (PF) 100 MCG/2ML IJ SOLN
INTRAMUSCULAR | Status: DC | PRN
  Administered 2016-04-02: 50 ug via INTRAVENOUS

## 2016-04-02 MED ORDER — ZOLPIDEM TARTRATE 5 MG PO TABS: 5.0000 mg | ORAL_TABLET | Freq: Every day | ORAL | Status: DC

## 2016-04-02 MED ORDER — PROPOFOL 10 MG/ML IV BOLUS
INTRAVENOUS | Status: AC
  Filled 2016-04-02: qty 20

## 2016-04-02 MED ORDER — SUGAMMADEX SODIUM 500 MG/5ML IV SOLN
INTRAVENOUS | Status: AC
  Filled 2016-04-02: qty 5

## 2016-04-02 MED ORDER — HYDROMORPHONE HCL 2 MG/ML IJ SOLN: 1.0000 mg | INTRAMUSCULAR | Status: DC | PRN

## 2016-04-02 MED ORDER — SODIUM CHLORIDE 0.9 % IV BOLUS (SEPSIS)
1000.0000 mL | Freq: Once | INTRAVENOUS | Status: AC
Start: 2016-04-02 — End: 2016-04-02
  Administered 2016-04-02: 1000 mL via INTRAVENOUS

## 2016-04-02 MED ORDER — CARVEDILOL 6.25 MG PO TABS
6.2500 mg | ORAL_TABLET | Freq: Two times a day (BID) | ORAL | Status: DC
  Administered 2016-04-02 – 2016-04-05 (×6): 6.25 mg via ORAL
  Filled 2016-04-02 (×6): qty 1

## 2016-04-02 MED ORDER — PHENYLEPHRINE HCL 10 MG/ML IJ SOLN
INTRAMUSCULAR | Status: AC
  Filled 2016-04-02: qty 1

## 2016-04-02 MED ORDER — TIZANIDINE HCL 4 MG PO TABS
4.0000 mg | ORAL_TABLET | Freq: Two times a day (BID) | ORAL | Status: DC | PRN
  Administered 2016-04-02 – 2016-04-03 (×3): 4 mg via ORAL
  Filled 2016-04-02 (×3): qty 1

## 2016-04-02 MED ORDER — FENTANYL CITRATE (PF) 100 MCG/2ML IJ SOLN
INTRAMUSCULAR | Status: AC
  Filled 2016-04-02: qty 2

## 2016-04-02 MED ORDER — SUGAMMADEX SODIUM 500 MG/5ML IV SOLN
INTRAVENOUS | Status: DC | PRN
  Administered 2016-04-02: 500 mg via INTRAVENOUS

## 2016-04-02 MED ORDER — KCL IN DEXTROSE-NACL 20-5-0.45 MEQ/L-%-% IV SOLN
INTRAVENOUS | Status: DC
  Administered 2016-04-02 – 2016-04-03 (×4): via INTRAVENOUS
  Filled 2016-04-02 (×4): qty 1000

## 2016-04-02 MED ORDER — INSULIN ASPART 100 UNIT/ML ~~LOC~~ SOLN
0.0000 [IU] | SUBCUTANEOUS | Status: DC
  Administered 2016-04-02: 4 [IU] via SUBCUTANEOUS
  Administered 2016-04-02: 11 [IU] via SUBCUTANEOUS
  Administered 2016-04-02 – 2016-04-03 (×2): 4 [IU] via SUBCUTANEOUS
  Administered 2016-04-03: 3 [IU] via SUBCUTANEOUS
  Administered 2016-04-03: 4 [IU] via SUBCUTANEOUS
  Administered 2016-04-03: 3 [IU] via SUBCUTANEOUS
  Administered 2016-04-03: 2 [IU] via SUBCUTANEOUS
  Administered 2016-04-04: 3 [IU] via SUBCUTANEOUS
  Administered 2016-04-04: 4 [IU] via SUBCUTANEOUS
  Administered 2016-04-04 – 2016-04-05 (×2): 3 [IU] via SUBCUTANEOUS

## 2016-04-02 MED ORDER — FENTANYL CITRATE (PF) 100 MCG/2ML IJ SOLN
25.0000 ug | INTRAMUSCULAR | Status: DC | PRN
  Administered 2016-04-02 (×2): 50 ug via INTRAVENOUS
  Filled 2016-04-02: qty 2

## 2016-04-02 MED ORDER — HYDROMORPHONE HCL 1 MG/ML IJ SOLN
1.0000 mg | Freq: Once | INTRAMUSCULAR | Status: AC
  Administered 2016-04-02: 1 mg via INTRAVENOUS
  Filled 2016-04-02: qty 1

## 2016-04-02 MED ORDER — DIPHENHYDRAMINE HCL 12.5 MG/5ML PO ELIX: 12.5000 mg | ORAL_SOLUTION | Freq: Four times a day (QID) | ORAL | Status: DC | PRN

## 2016-04-02 MED ORDER — AMITRIPTYLINE HCL 25 MG PO TABS
25.0000 mg | ORAL_TABLET | Freq: Every day | ORAL | Status: DC
  Administered 2016-04-02 – 2016-04-04 (×3): 25 mg via ORAL
  Filled 2016-04-02 (×3): qty 1

## 2016-04-02 MED ORDER — SUCCINYLCHOLINE CHLORIDE 200 MG/10ML IV SOSY
PREFILLED_SYRINGE | INTRAVENOUS | Status: DC | PRN
  Administered 2016-04-02: 120 mg via INTRAVENOUS

## 2016-04-02 MED ORDER — MIDAZOLAM HCL 2 MG/2ML IJ SOLN
INTRAMUSCULAR | Status: AC
  Filled 2016-04-02: qty 2

## 2016-04-02 SURGICAL SUPPLY — 20 items
CHLORAPREP W/TINT 26ML (MISCELLANEOUS) ×3 IMPLANT
COVER SURGICAL LIGHT HANDLE (MISCELLANEOUS) ×3 IMPLANT
DERMABOND ADVANCED (GAUZE/BANDAGES/DRESSINGS) ×4
DERMABOND ADVANCED .7 DNX12 (GAUZE/BANDAGES/DRESSINGS) ×2 IMPLANT
DRAIN CHANNEL 19F RND (DRAIN) IMPLANT
DRAPE LAPAROSCOPIC ABDOMINAL (DRAPES) ×3 IMPLANT
ELECT COATED BLADE 2.86 ST (ELECTRODE) ×3 IMPLANT
EVACUATOR SILICONE 100CC (DRAIN) IMPLANT
GLOVE BIO SURGEON STRL SZ7.5 (GLOVE) ×9 IMPLANT
GOWN STRL REUS W/TWL XL LVL3 (GOWN DISPOSABLE) ×6 IMPLANT
HEMOSTAT ARISTA ABSORB 3G PWDR (MISCELLANEOUS) ×3 IMPLANT
KIT BASIN OR (CUSTOM PROCEDURE TRAY) ×3 IMPLANT
KIT MARKER MARGIN INK (KITS) IMPLANT
NEEDLE HYPO 21X1.5 SAFETY (NEEDLE) ×3 IMPLANT
PACK GENERAL/GYN (CUSTOM PROCEDURE TRAY) ×3 IMPLANT
SUT ETHILON 2 0 PS N (SUTURE) IMPLANT
SUT MNCRL AB 4-0 PS2 18 (SUTURE) ×6 IMPLANT
SUT VIC AB 2-0 SH 18 (SUTURE) ×3 IMPLANT
TOWEL OR 17X26 10 PK STRL BLUE (TOWEL DISPOSABLE) ×3 IMPLANT
TOWEL OR NON WOVEN STRL DISP B (DISPOSABLE) ×3 IMPLANT

## 2016-04-02 NOTE — Progress Notes (Signed)
Obtained OK from provider on call to give pt 4 mg zofran po sublingually.

## 2016-04-02 NOTE — Progress Notes (Signed)
CRITICAL VALUE ALERT  Critical value received:  hgb 6.7  Date of notification:  04/02/17   Time of notification:  0555              Critical value read back:Yes.    Nurse who received alert:Patti R  MD notified (1st page):  0600  Time of first page:  0600  MD notified (2nd page):  Time of second page:  Responding MD:  Joyice Faster   Time MD responded:  [phone]

## 2016-04-02 NOTE — Anesthesia Preprocedure Evaluation (Addendum)
Anesthesia Evaluation  Patient identified by MRN, date of birth, ID band Patient awake    Reviewed: Allergy & Precautions, H&P , NPO status , Patient's Chart, lab work & pertinent test results  Airway Mallampati: III  TM Distance: >3 FB Neck ROM: Full    Dental no notable dental hx. (+) Teeth Intact, Dental Advisory Given   Pulmonary sleep apnea , former smoker,    Pulmonary exam normal breath sounds clear to auscultation       Cardiovascular hypertension, Pt. on medications and Pt. on home beta blockers +CHF  + dysrhythmias Atrial Fibrillation  Rhythm:Regular Rate:Normal     Neuro/Psych negative neurological ROS  negative psych ROS   GI/Hepatic negative GI ROS, Neg liver ROS,   Endo/Other  diabetes, Type 2, Oral Hypoglycemic AgentsHypothyroidism Morbid obesity  Renal/GU Renal InsufficiencyRenal disease  negative genitourinary   Musculoskeletal  (+) Arthritis , Osteoarthritis,  Fibromyalgia -  Abdominal   Peds  Hematology negative hematology ROS (+) anemia ,   Anesthesia Other Findings   Reproductive/Obstetrics negative OB ROS                            Anesthesia Physical Anesthesia Plan  ASA: III and emergent  Anesthesia Plan: General   Post-op Pain Management:    Induction: Intravenous, Rapid sequence and Cricoid pressure planned  Airway Management Planned: Oral ETT  Additional Equipment: Arterial line  Intra-op Plan:   Post-operative Plan: Extubation in OR and Possible Post-op intubation/ventilation  Informed Consent: I have reviewed the patients History and Physical, chart, labs and discussed the procedure including the risks, benefits and alternatives for the proposed anesthesia with the patient or authorized representative who has indicated his/her understanding and acceptance.   Dental advisory given  Plan Discussed with: CRNA  Anesthesia Plan Comments:        Anesthesia Quick Evaluation

## 2016-04-02 NOTE — Progress Notes (Addendum)
1740 Call placed to Dr Hassell Done concerning MAP for BP consistently < 50 (44-56). Awaiting call back. Clarification called placed at 1740 not 0540.  Call returned, orders received.

## 2016-04-02 NOTE — Anesthesia Postprocedure Evaluation (Signed)
Anesthesia Post Note  Patient: LANA FLAIM  Procedure(s) Performed: Procedure(s) (LRB): EVACUATION HEMATOMA LEFT BREAST (Left)  Patient location during evaluation: ICU Anesthesia Type: General Level of consciousness: awake and alert Pain management: pain level controlled Vital Signs Assessment: post-procedure vital signs reviewed and stable Respiratory status: spontaneous breathing, nonlabored ventilation, respiratory function stable and patient connected to nasal cannula oxygen Cardiovascular status: blood pressure returned to baseline and stable Postop Assessment: no signs of nausea or vomiting Anesthetic complications: no    Last Vitals:  Vitals:   04/02/16 0918 04/02/16 1210  BP: (!) 96/38   Pulse:  63  Resp:  (!) 21  Temp: 36.4 C     Last Pain:  Vitals:   04/02/16 1224  TempSrc:   PainSc: 5                  Waller Marcussen,W. EDMOND

## 2016-04-02 NOTE — Addendum Note (Signed)
Addendum  created 04/02/16 2042 by Oleta Mouse, MD   Anesthesia Intra Blocks edited, Pend clinical note, Sign clinical note

## 2016-04-02 NOTE — ED Notes (Signed)
Surgeon at bedside-suction released from left JP

## 2016-04-02 NOTE — Progress Notes (Signed)
Pt bp 71/45, pulse 70, Hgb 6.9, notified provider on call, received order for 1 liter NS bolus and type and screen and transfuse 1 units PRBC.s

## 2016-04-02 NOTE — Transfer of Care (Signed)
Immediate Anesthesia Transfer of Care Note  Patient: Maureen [name]  Procedure(s) Performed: Procedure(s): EVACUATION HEMATOMA LEFT BREAST (Left)  Patient Location: PACU and ICU  Anesthesia Type:General  Level of Consciousness: awake, alert  and oriented  Airway & Oxygen Therapy: Patient Spontanous Breathing and Patient connected to face mask oxygen  Post-op Assessment: Report given to RN and Post -op Vital signs reviewed and stable  Post vital signs: Reviewed and stable  Last Vitals:  Vitals:   04/02/16 0852 04/02/16 0918  BP: (!) 91/16 (!) 96/38  Pulse:    Resp: 14   Temp: 36.3 C 36.4 C    Last Pain:  Vitals:   04/02/16 0918  TempSrc: Oral  PainSc:          Complications: No apparent anesthesia complications

## 2016-04-02 NOTE — H&P (Signed)
The patient was admitted 2 weeks status post mastectomy with sudden enlargement of her left chest wall. Her hemoglobin was 6.7. She likely has a large hematoma beneath the mastectomy flaps. Given her hypertension and increasing creatinine and she will need to have the hematoma evacuated immediately. I have discussed with her in detail the risks and benefits of the operation as well as some of the technical aspects and she understands and wishes to proceed. I will transfer her to the ICU for further resuscitation measures and transfuse her as symptoms the blood is available.

## 2016-04-02 NOTE — Progress Notes (Signed)
[facility] Progress Note Patient Name: Maureen [name] DOB: July 24, 1950 MRN: [id number]   Date of Service  04/02/2016  HPI/Events of Note  Multiple issues: 1. Oliguria and 2. Patient requests home Ambien.   eICU Interventions  Will order: 1. 0.9 NaCl 1 liter IV over 1 hour now.  2. Ambien 5 mg PO at HS X 1. Primary Team to decide to continue or not in AM.     Intervention Category Intermediate Interventions: Oliguria - evaluation and management  Sommer,Steven Eugene 04/02/2016, 7:50 PM

## 2016-04-02 NOTE — H&P (Signed)
Maureen [name] is an 65 y.o. female.   Chief Complaint: Breast drainage HPI: Maureen [name] is a 65 y.o. female who is ~2 wks s/p mastectomy who presents to the Emergency Department complaining of sudden onset swelling to left breast which began one hour ago. She has had increased drainage since yesterday.  She attributed that to a broken stitch.  Pt rates her pain as 9/10 in severity. Pt denies any fever or any other associated symptoms.  Past Medical History:  Diagnosis Date  . Anemia   . Arthritis   . Atrial fibrillation ([place])   . Atrial flutter ([place])   . Breast cancer of upper-outer quadrant of left female breast ([place]) 01/06/2016  . Bronchitis    with a cold  . CHF (congestive heart failure) ([place])   . Chronic kidney disease   . Diabetes ([place])   . Edema   . Family history of adverse reaction to anesthesia    " my father was hard to put under."  . Fatty liver   . Fibromyalgia   . GERD (gastroesophageal reflux disease)   . Hepatitis A   . History of blood transfusion reaction   . Hypertension   . Hypothyroidism   . IBS (irritable bowel syndrome)   . Insomnia   . Osteoarthritis   . Sleep apnea   . Vitamin B deficiency   . Wears glasses     Past Surgical History:  Procedure Laterality Date  . ABDOMINAL HYSTERECTOMY    . BREAST SURGERY     left breast biopsy  . CARDIAC CATHETERIZATION     05/07/14 Cardiac cath: Normal coronaries, NL LVF, EF 65% ([facility])  . CARPAL TUNNEL RELEASE    . CESAREAN SECTION    . CHOLECYSTECTOMY    . DILATION AND CURETTAGE OF UTERUS    . EYE SURGERY     strabismus and B/L cataracts  . HERNIA REPAIR    . MASTECTOMY W/ SENTINEL NODE BIOPSY Bilateral 03/21/2016   Procedure: LEFT MASTECTOMY WITH LEFT SENTINEL LYMPH NODE BIOPSY, RIGHT PROPHYLACTIC MASTECTOMY;  Surgeon: [name] III, MD;  Location: [facility];  Service: General;  Laterality: Bilateral;  . TONSILLECTOMY    . TRANSTHORACIC ECHOCARDIOGRAM     05/06/14 TTE [facility]): Mild concentric LVH, LVEF prob low normal (diff to eval due to rapid AF), mild MR.    Family History  Problem Relation Age of Onset  . Breast cancer Mother   . Colon cancer Maternal Grandmother    Social History:  reports that she quit smoking about 9 years ago. Her smoking use included Cigarettes. She has never used smokeless tobacco. She reports that she does not drink alcohol or use drugs.  Allergies:  Allergies  Allergen Reactions  . Cephalexin Hives  . Codeine Other (See Comments)    Heartburn   . Doxycycline Itching  . Hydrocodone Itching  . Penicillin G Other (See Comments)    PATIENT DOESN'T REMEMBER [DOES HAVE ALLERGIC RESPONSE TO CEPHALEXIN > HIVES]  Has patient had a PCN reaction causing immediate rash, facial/tongue/throat swelling, SOB or lightheadedness with hypotension: unknown Has patient had a PCN reaction causing severe rash involving mucus membranes or skin necrosis: unknown Has patient had a PCN reaction that required hospitalization: unknown Has patient had a PCN reaction occurring within the last 10 years: unknown If all of the above answers are "NO", th  . Percodan [Oxycodone-Aspirin] Itching     (Not in a hospital admission)  No  results found for this or any previous visit (from the past 72 hour(s)). No results found.  Review of Systems  Constitutional: Negative for chills and fever.  HENT: Negative for hearing loss and tinnitus.   Eyes: Negative for blurred vision and double vision.  Respiratory: Negative for cough and sputum production.   Cardiovascular: Negative for chest pain and palpitations.  Gastrointestinal: Negative for abdominal pain, nausea and vomiting.  Genitourinary: Negative for dysuria, frequency and urgency.  Skin: Negative for itching and rash.  Neurological: Negative for dizziness and headaches.    Blood pressure 157/68, pulse 87, temperature 97.8 F (36.6 C), temperature source Oral, resp. rate 18, height 5\' 4"   (1.626 m), weight (!) 137.4 kg (303 lb), SpO2 95 %. Physical Exam  Constitutional: She is oriented to person, place, and time. She appears well-developed and well-nourished.  HENT:  Head: Normocephalic and atraumatic.  Eyes: Conjunctivae and EOM are normal. Pupils are equal, round, and reactive to light.  Neck: Normal range of motion. Neck supple.  Cardiovascular: Normal rate and regular rhythm.   Respiratory: Effort normal and breath sounds normal. No respiratory distress. She exhibits tenderness and swelling.  GI: Soft. She exhibits no distension. There is no tenderness. There is no rebound.  Musculoskeletal: Normal range of motion.  Neurological: She is alert and oriented to person, place, and time.  Skin: Skin is warm and dry.     Assessment/Plan Pt appears to have some bleeding in her left breast. Hgb ok. Will get her admitted for observation.  Stop anticoagulation.  May need washout in the OR later today once her Elliquis has had more time to wear off.  Rosario Adie., MD 81/01/3158, 12:24 AM

## 2016-04-02 NOTE — Consult Note (Signed)
PULMONARY / CRITICAL CARE MEDICINE   Name: Maureen [name] MRN: [id number] DOB: 11-20-1950    ADMISSION DATE:  04/01/2016 CONSULTATION DATE:  04/02/16  REFERRING MD:  Dr. Marlou Starks   CHIEF COMPLAINT:  Hypovolemic shock, acute blood loss anemia   HISTORY OF PRESENT ILLNESS:   65 y/o F with PMH of arthritis, fatty liver, fibromyalgia, IBS, GERD, hypothyroidism, insomnia, OSA (followed by Dr. 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 Marlou Starks) who presented to [facility] on 12/10 with reports of pain, bleeding from left drain site and sudden enlargement of left chest wall.    The patient was found to have a Hgb of 8.1.   She was admitted to the medical floor. On the am of 12/11, she developed worsening hypotension with repeat Hgb of 6.7 concerning for acute blood loss / hematoma at left chest site under mastectomy flaps.   She was transferred to the ICU, PRBC's initiated.  PCCM consulted for evaluation. Central venous access established for volume resuscitation.    The patient currently reports nausea, vomiting with small volume clear emesis.  She also reports chest discomfort at left surgical site.    PAST MEDICAL HISTORY :  She  has a past medical history of Anemia; Arthritis; Atrial fibrillation ([place]); Atrial flutter ([place]); Breast cancer of upper-outer quadrant of left female breast ([place]) (01/06/2016); Bronchitis; CHF (congestive heart failure) ([place]); Chronic kidney disease; Diabetes ([place]); Edema; Family history of adverse reaction to anesthesia; Fatty liver; Fibromyalgia; GERD (gastroesophageal reflux disease); Hepatitis A; History of blood transfusion reaction; Hypertension; Hypothyroidism; IBS (irritable bowel syndrome); Insomnia; Osteoarthritis; Sleep apnea; Vitamin B deficiency; and Wears glasses.  PAST SURGICAL HISTORY: She  has a past surgical history that includes Hernia repair; Tonsillectomy; Cesarean section; Cholecystectomy;  Carpal tunnel release; Eye surgery; Abdominal hysterectomy; Dilation and curettage of uterus; Breast surgery; Cardiac catheterization; transthoracic echocardiogram; and Mastectomy w/ sentinel node biopsy (Bilateral, 03/21/2016).  Allergies  Allergen Reactions  . Cephalexin Hives  . Codeine Other (See Comments)    Heartburn   . Doxycycline Itching  . Hydrocodone Itching  . Penicillin G Other (See Comments)    PATIENT DOESN'T REMEMBER [DOES HAVE ALLERGIC RESPONSE TO CEPHALEXIN > HIVES]  Has patient had a PCN reaction causing immediate rash, facial/tongue/throat swelling, SOB or lightheadedness with hypotension: unknown Has patient had a PCN reaction causing severe rash involving mucus membranes or skin necrosis: unknown Has patient had a PCN reaction that required hospitalization: unknown Has patient had a PCN reaction occurring within the last 10 years: unknown If all of the above answers are "NO", th  . Percodan [Oxycodone-Aspirin] Itching    No current facility-administered medications on file prior to encounter.    Current Outpatient Prescriptions on File Prior to Encounter  Medication Sig  . amiodarone (PACERONE) 200 MG tablet TAKE 1 TABLET ONCE A DAY  . amitriptyline (ELAVIL) 50 MG tablet TAKE 1 TABLET BY MOUTH AT BEDTIME  . apixaban (ELIQUIS) 5 MG TABS tablet TAKE 1 TABLET EVERY 12 HOURS  . carvedilol (COREG) 6.25 MG tablet TAKE 1 TABLET BY MOUTH TWICE A DAY  . Cyanocobalamin 1000 MCG/ML LIQD Inject 1,000 mcg as directed every 30 days.  Marland Kitchen doxazosin (CARDURA) 4 MG tablet Take 4 mg by mouth at bedtime.   . fenofibrate 160 MG tablet TAKE 1 TABLET BY MOUTH AT BEDTIME  . furosemide (LASIX) 40 MG tablet take 40 to 80 mgs daily based on weight gain  . HYDROmorphone (DILAUDID)  2 MG tablet Take 0.5 tablets (1 mg total) by mouth every 2 (two) hours as needed for severe pain.  Marland Kitchen levothyroxine (SYNTHROID, LEVOTHROID) 100 MCG tablet Take 100 mcg by mouth at bedtime.  . Liraglutide  (VICTOZA) 18 MG/3ML SOPN Inject 0.6 mg into the skin daily.   Marland Kitchen loperamide (IMODIUM A-D) 2 MG tablet Take 4 mg by mouth 4 (four) times daily as needed for diarrhea or loose stools.  . metFORMIN (GLUCOPHAGE-XR) 500 MG 24 hr tablet TAKE 1 TABLETS BY MOUTH TWICE A DAY  . tiZANidine (ZANAFLEX) 4 MG tablet take 8mg s by mouth at bedtime  . traMADol (ULTRAM) 50 MG tablet takes 100mg s twice daily as needed for pain  . VITAMIN D, ERGOCALCIFEROL, PO Take 500 Units by mouth 2 (two) times daily.  Marland Kitchen zolpidem (AMBIEN) 10 MG tablet TAKE 1 TABLET BY MOUTH AT BEDTIME  . clindamycin (CLEOCIN) 300 MG capsule Take 600 mg by mouth 2 (two) times daily as needed (for dental procedures).    FAMILY HISTORY:  Her indicated that her mother is deceased. She indicated that her father is deceased. She indicated that the status of her maternal grandmother is unknown.    SOCIAL HISTORY: She  reports that she quit smoking about 9 years ago. Her smoking use included Cigarettes. She has never used smokeless tobacco. She reports that she does not drink alcohol or use drugs.  REVIEW OF SYSTEMS:  POSITIVES IN BOLD Gen: Denies fever, chills, weight change, fatigue, night sweats HEENT: Denies blurred vision, double vision, hearing loss, tinnitus, sinus congestion, rhinorrhea, sore throat, neck stiffness, dysphagia PULM: Denies shortness of breath, cough, sputum production, hemoptysis, wheezing CV: Denies chest pain, edema, orthopnea, paroxysmal nocturnal dyspnea, palpitations GI: Denies abdominal pain, nausea, vomiting, diarrhea, hematochezia, melena, constipation, change in bowel habits GU: Denies dysuria, hematuria, polyuria, oliguria, urethral discharge Endocrine: Denies hot or cold intolerance, polyuria, polyphagia or appetite change Derm: Denies rash, dry skin, scaling or peeling skin change Heme: Denies easy bruising, bleeding, bleeding gums Neuro: Denies headache, numbness, weakness, slurred speech, loss of memory or  consciousness   SUBJECTIVE:   VITAL SIGNS: BP (!) 78/36 (BP Location: Right Arm)   Pulse 70   Temp 99.1 F (37.3 C) (Axillary)   Resp 20   Ht 5\' 4"  (1.626 m)   Wt (!) 303 lb (137.4 kg)   SpO2 99%   BMI 52.01 kg/m   HEMODYNAMICS:    VENTILATOR SETTINGS:    INTAKE / OUTPUT: I/O last 3 completed shifts: In: 600 [P.O.:120; I.V.:480] Out: 175 [Drains:100; Other:75]  PHYSICAL EXAMINATION: General:  Obese female, pale, lying in bed  Neuro:  Awake/alert, speech clear, MAE, mild intermittent confusion / easily reoriented HEENT:  MM pale, dry, no jvd Cardiovascular:  s1s2 rrr, no m/r/g  Lungs:  Even/non-labored, lungs bilaterally distant but clear  Abdomen:  Obese/soft, bsx4 active  Musculoskeletal:  No acute deformities  Skin:  Pale, warm/dry, no edema   LABS:  BMET  Recent Labs Lab 04/02/16 0034  NA 136  K 3.9  CL 98*  CO2 29  BUN 39*  CREATININE 2.28*  GLUCOSE 228*    Electrolytes  Recent Labs Lab 04/02/16 0034  CALCIUM 9.1    CBC  Recent Labs Lab 04/02/16 0034 04/02/16 0502  WBC 8.7 10.5  HGB 8.1* 6.7*  HCT 25.0* 20.5*  PLT 405* 414*    Coag's No results for input(s): APTT, INR in the last 168 hours.  Sepsis Markers No results for input(s): LATICACIDVEN, PROCALCITON, O2SATVEN  in the last 168 hours.  ABG No results for input(s): PHART, PCO2ART, PO2ART in the last 168 hours.  Liver Enzymes No results for input(s): AST, ALT, ALKPHOS, BILITOT, ALBUMIN in the last 168 hours.  Cardiac Enzymes No results for input(s): TROPONINI, PROBNP in the last 168 hours.  Glucose  Recent Labs Lab 04/02/16 0308 04/02/16 0420 04/02/16 0753  GLUCAP 340* 276* 159*    Imaging No results found.   STUDIES:    CULTURES:   ANTIBIOTICS:   SIGNIFICANT EVENTS: 12/10  Admit with pain, bleeding and swelling at surgical site of mastectomy (L) 12/11  PCCM consulted for hypotension, anemia > tx to ICU. To OR for eval of hematoma /  bleeding  LINES/TUBES: R IJ TLC 12/11 >>   DISCUSSION: 65 y/o F with PMH of recent bilateral mastectomy admitted 12/11 with pain  ASSESSMENT / PLAN:  PULMONARY A: Hx OSA on CPAP  P:   Continue noctural CPAP O2 as needed to support sats >92% Follow up CXR in am  Aspiration precautions with n/v  CARDIOVASCULAR A:  Hypotension - responding to volume / PRBC Hx HTN, CHF, AF/A-Flutter P:  Now PRBC resuscitation, 2 units ordered, stay 2 ahead  Q6 Hgb for now Hold daily lasix, coreg Continue amiodarone for now Reduce sedating medications ICU monitoring    RENAL A:   Acute Kidney Injury on CKD- in the setting of anemia, hypotension Hypokalemia  P:   Change IVF to NS @ 50 ml/hr while resuscitating with PRBC's Trend BMP / UOP  Replace electrolytes as needed   GASTROINTESTINAL A:   Nausea / Vomiting Obesity  P:   PRN zofran  NPO  Oral care Aspiration precautions  HEMATOLOGIC / ONC A:   Acute Blood Loss Anemia  Left High Grade Ductal Carcinoma - dx 01/06/16, s/p mastectomy 11/29 Hx Transfusion Reaction - pt reports SOB, chest pressure P:  OR now for evacuation of hematoma / source control  Hold lovenox  SCD's for DVT prophylaxis  12.5 benadryl x1  INFECTIOUS A:   No acute infectious process  P:   Trend WBC/fever curve  Monitor surgical sites   ENDOCRINE A:   DM  Hypothyroidism  P:   Hold home insulin regimen  Synthroid 50 mcg IV QD  NEUROLOGIC A:   Mild Encephalopathy - in setting of volume loss, anemia, pain Fibromyalgia  P:   RASS goal: n/a  Minimize sedating medications  Reduce elavil to 25 mg QHS PRN fentanyl for pain    FAMILY  - Updates: Family updated at bedside on plan of care.  Consented for central line and blood products.  - Inter-disciplinary family meet or Palliative Care meeting due by: 12/18   CC Time: 14 minutes  Noe Gens, NP-C [facility] Pgr: [phone] or if no answer [phone] 04/02/2016,  8:27 AM  Attending:  I have seen and examined the patient with nurse practitioner/resident and agree with the note above.  We formulated the plan together and I elicited the following history.    Ms. Nile had a mastectomy two weeks ago and presented with bleeding on 12/10.  She has baseline CHF (unknown systolic function), Afib on eliquis, and hypertension with CKD.  She also has sleep apnea.  Overnight she developed shock, brought to the ICU.  Then later went emergently to the OR for L chest wall hematoma evacuation where she received three units of PRBC.  On return to the ICU she notes some post operative chest pain, otherwise  stable.    On exam Gen: resting comfortably HENT: NCAT OP clear PULM: CTA B CV: RRR, no mgr GI: BS+, soft, nonteder Derm: no redness, but some tenderness over left chest wall  Hemorrhagic shock> now resolved, hold further blood product adminstration and check stat CBC, PT/INR, follow drain output and serial CBC CHF> currently not in exacerbation, restart coreg now that she is not in shock, careful with further fluid resuscitation, KVO fluids, may need lasix today CKD> at baseline? Monitor UOP closely, repeat BMET  My cc time independent of APP time is 30 minutes  Roselie Awkward, MD [place] [facility] Pager: [phone] Cell: [phone] After 3pm or if no response, call [phone]

## 2016-04-02 NOTE — Procedures (Signed)
Central Venous Catheter Insertion Procedure Note Maureen [name] [id number] 05-17-1950  Procedure: Insertion of Central Venous Catheter Indications: Assessment of intravascular volume, Drug and/or fluid administration and Frequent blood sampling  Procedure Details Consent: Risks of procedure as well as the alternatives and risks of each were explained to the (patient/caregiver).  Consent for procedure obtained.   Time Out: Verified patient identification, verified procedure, site/side was marked, verified correct patient position, special equipment/implants available, medications/allergies/relevent history reviewed, required imaging and test results available.  Performed  Maximum sterile technique was used including antiseptics, cap, gloves, gown, hand hygiene, mask and sheet. Skin prep: Chlorhexidine; local anesthetic administered  A antimicrobial bonded/coated triple lumen catheter was placed in the right internal jugular vein to 16 cm using the Seldinger technique, sutured in place.  Evaluation Blood flow good Complications: No apparent complications Patient tolerated the procedure well. Chest X-ray ordered to verify placement.  CXR: pending.    Procedure performed under direct supervision of Dr. Lake Bells and with ultrasound guidance for real time vessel cannulation.     Noe Gens, NP-C [facility] Pgr: [phone] or if no answer [phone] 04/02/2016, 9:35 AM  Attending:  Agree, supervised  Roselie Awkward, MD [place] [facility] Pager: [phone] Cell: [phone] After 3pm or if no response, call [phone]

## 2016-04-02 NOTE — Progress Notes (Signed)
Pt's IV fell out.  Was loose when she came from ED.  Notified IV team stat.

## 2016-04-02 NOTE — Op Note (Signed)
04/01/2016 - 04/02/2016  11:30 AM  PATIENT:  Maureen [name]  65 y.o. female  PRE-OPERATIVE DIAGNOSIS:  hematoma left chest wall, s/p mastectomy  POST-OPERATIVE DIAGNOSIS:  hematoma left chest wall, s/p mastectomy  PROCEDURE:  Procedure(s): EVACUATION HEMATOMA LEFT BREAST (Left)  SURGEON:  Surgeon(s) and Role:    * Jovita Kussmaul, MD - Primary    * Leighton Ruff, MD - Assisting  PHYSICIAN ASSISTANT:   ASSISTANTS: Dr. Marcello Moores   ANESTHESIA:   general  EBL:  Total I/O In: 700 [Blood:700] Out: 190 [Urine:10; Drains:180]  BLOOD ADMINISTERED:3 units PRBC  DRAINS: (1) Jackson-Pratt drain(s) with closed bulb suction in the prepectoral space   LOCAL MEDICATIONS USED:  NONE  SPECIMEN:  No Specimen  DISPOSITION OF SPECIMEN:  N/A  COUNTS:  YES  TOURNIQUET:  * No tourniquets in log *  DICTATION: .Dragon Dictation   After informed consent was obtained the patient was brought to the operating room and placed in the supine position on the operating table. After adequate induction of general anesthesia the patient's left breast and chest wall were prepped with ChloraPrep, allowed to dry, draped in usual sterile manner. An appropriate timeout was performed. The previous left mastectomy incision was opened sharply with a 10 blade knife. A large hematoma was evacuated from the chest wall area. The entire cavity was then inspected. There was some generalized oozing that was controlled with the cautery. There was a small pumping vessel identified along the medial operative bed. This was also cauterized. Once this was accomplished the wound was hemostatic. I then coated all the surfaces with Arista. The drain was replaced back into the operative bed. The superior and inferior flaps were then grossly reapproximated with interrupted 2-0 Vicryl stitches. The skin was then closed with a running 4-0 Monocryl subcuticular stitch. Dermabond dressings were applied. The drain was placed to bulb suction and  there was a good seal. The skin flaps were healthy. Sterile dressings were applied. The patient tolerated the procedure well. At the end of the case all needle sponge and instrument counts were correct. The patient was then awakened and taken to recovery in stable condition. The patient was on some Neo at the close of the case for support of blood pressure but her blood pressure was improving with transfusion.  PLAN OF CARE: Admit to inpatient   PATIENT DISPOSITION:  PACU - guarded condition.   Delay start of Pharmacological VTE agent (>24hrs) due to surgical blood loss or risk of bleeding: yes

## 2016-04-02 NOTE — Progress Notes (Signed)
Reported to provider that pt had not voided since 1000 yesterday.  Ordered foley, will continue to monitor.

## 2016-04-02 NOTE — Progress Notes (Signed)
Pt has bp  84/82. Notified provider on call. Ordered to hold all bp and other meds that could decrease her bp.

## 2016-04-02 NOTE — Anesthesia Procedure Notes (Signed)
Procedure Name: Intubation Date/Time: 04/02/2016 10:43 AM Performed by: Lind Covert Pre-anesthesia Checklist: Patient identified, Emergency Drugs available, Suction available, Patient being monitored and Timeout performed Patient Re-evaluated:Patient Re-evaluated prior to inductionOxygen Delivery Method: Circle system utilized Preoxygenation: Pre-oxygenation with 100% oxygen Intubation Type: IV induction, Rapid sequence and Cricoid Pressure applied Laryngoscope Size: Mac and 4 Grade View: Grade II Tube type: Oral Tube size: 7.0 mm Number of attempts: 1 Airway Equipment and Method: Stylet Placement Confirmation: ETT inserted through vocal cords under direct vision,  positive ETCO2 and breath sounds checked- equal and bilateral Secured at: 22 cm Tube secured with: Tape Dental Injury: Teeth and Oropharynx as per pre-operative assessment

## 2016-04-02 NOTE — Progress Notes (Signed)
Pt reported to me at 0625 that she has not voided since yesterday about 10 am

## 2016-04-03 DIAGNOSIS — I953 Hypotension of hemodialysis: Secondary | ICD-10-CM

## 2016-04-03 LAB — CBC
HCT: 27 % — ABNORMAL LOW (ref 36.0–46.0)
HCT: 27.2 % — ABNORMAL LOW (ref 36.0–46.0)
HEMATOCRIT: 20.4 % — AB (ref 36.0–46.0)
HEMATOCRIT: 26.1 % — AB (ref 36.0–46.0)
HEMOGLOBIN: 7.1 g/dL — AB (ref 12.0–15.0)
HEMOGLOBIN: 9.5 g/dL — AB (ref 12.0–15.0)
HEMOGLOBIN: 8.9 g/dL — AB (ref 12.0–15.0)
Hemoglobin: 9.4 g/dL — ABNORMAL LOW (ref 12.0–15.0)
MCH: 29.7 pg (ref 26.0–34.0)
MCH: 30.6 pg (ref 26.0–34.0)
MCH: 30 pg (ref 26.0–34.0)
MCH: 29.8 pg (ref 26.0–34.0)
MCHC: 34.8 g/dL (ref 30.0–36.0)
MCHC: 35.2 g/dL (ref 30.0–36.0)
MCHC: 34.6 g/dL (ref 30.0–36.0)
MCHC: 34.1 g/dL (ref 30.0–36.0)
MCV: 85.4 fL (ref 78.0–100.0)
MCV: 87.1 fL (ref 78.0–100.0)
MCV: 86.9 fL (ref 78.0–100.0)
MCV: 87.3 fL (ref 78.0–100.0)
PLATELETS: 258 10*3/uL (ref 150–400)
Platelets: 250 10*3/uL (ref 150–400)
Platelets: 255 10*3/uL (ref 150–400)
Platelets: 260 10*3/uL (ref 150–400)
RBC: 2.39 MIL/uL — ABNORMAL LOW (ref 3.87–5.11)
RBC: 3.1 MIL/uL — AB (ref 3.87–5.11)
RBC: 3.13 MIL/uL — AB (ref 3.87–5.11)
RBC: 2.99 MIL/uL — ABNORMAL LOW (ref 3.87–5.11)
RDW: 15 % (ref 11.5–15.5)
RDW: 15.1 % (ref 11.5–15.5)
RDW: 15.1 % (ref 11.5–15.5)
RDW: 15.2 % (ref 11.5–15.5)
WBC: 12.9 10*3/uL — ABNORMAL HIGH (ref 4.0–10.5)
WBC: 14.5 10*3/uL — ABNORMAL HIGH (ref 4.0–10.5)
WBC: 13.4 10*3/uL — AB (ref 4.0–10.5)
WBC: 13.2 10*3/uL — AB (ref 4.0–10.5)

## 2016-04-03 LAB — BASIC METABOLIC PANEL
Anion gap: 5 (ref 5–15)
BUN: 41 mg/dL — AB (ref 6–20)
CHLORIDE: 103 mmol/L (ref 101–111)
CO2: 25 mmol/L (ref 22–32)
CREATININE: 3.07 mg/dL — AB (ref 0.44–1.00)
Calcium: 7.2 mg/dL — ABNORMAL LOW (ref 8.9–10.3)
GFR calc Af Amer: 17 mL/min — ABNORMAL LOW (ref >60)
GFR calc non Af Amer: 15 mL/min — ABNORMAL LOW (ref >60)
GLUCOSE: 157 mg/dL — AB (ref 65–99)
POTASSIUM: 4.6 mmol/L (ref 3.5–5.1)
SODIUM: 133 mmol/L — AB (ref 135–145)

## 2016-04-03 LAB — PREPARE FRESH FROZEN PLASMA
UNIT DIVISION: 0
Unit division: 0

## 2016-04-03 LAB — GLUCOSE, CAPILLARY
GLUCOSE-CAPILLARY: 144 mg/dL — AB (ref 65–99)
Glucose-Capillary: 125 mg/dL — ABNORMAL HIGH (ref 65–99)
Glucose-Capillary: 164 mg/dL — ABNORMAL HIGH (ref 65–99)
Glucose-Capillary: 116 mg/dL — ABNORMAL HIGH (ref 65–99)

## 2016-04-03 LAB — PHOSPHORUS: Phosphorus: 3.8 mg/dL (ref 2.5–4.6)

## 2016-04-03 LAB — TROPONIN I: TROPONIN I: 0.07 ng/mL — AB (ref <0.03)

## 2016-04-03 LAB — PREPARE RBC (CROSSMATCH)

## 2016-04-03 LAB — MAGNESIUM: MAGNESIUM: 1.6 mg/dL — AB (ref 1.7–2.4)

## 2016-04-03 MED ORDER — LIP MEDEX EX OINT
TOPICAL_OINTMENT | CUTANEOUS | Status: DC | PRN
  Administered 2016-04-03: 06:00:00 via TOPICAL
  Filled 2016-04-03: qty 7

## 2016-04-03 MED ORDER — FUROSEMIDE 10 MG/ML IJ SOLN
40.0000 mg | Freq: Once | INTRAMUSCULAR | Status: AC
  Administered 2016-04-03: 40 mg via INTRAVENOUS
  Filled 2016-04-03: qty 4

## 2016-04-03 MED ORDER — DOCUSATE SODIUM 100 MG PO CAPS
100.0000 mg | ORAL_CAPSULE | Freq: Two times a day (BID) | ORAL | Status: DC
  Administered 2016-04-03 – 2016-04-05 (×5): 100 mg via ORAL
  Filled 2016-04-03 (×5): qty 1

## 2016-04-03 MED ORDER — HYDROMORPHONE HCL 2 MG PO TABS
2.0000 mg | ORAL_TABLET | ORAL | Status: DC | PRN
  Administered 2016-04-03 – 2016-04-05 (×17): 4 mg via ORAL
  Filled 2016-04-03 (×17): qty 2

## 2016-04-03 MED ORDER — SODIUM CHLORIDE 0.9 % IV BOLUS (SEPSIS)
1000.0000 mL | Freq: Once | INTRAVENOUS | Status: AC
  Administered 2016-04-03: 1000 mL via INTRAVENOUS

## 2016-04-03 MED ORDER — SODIUM CHLORIDE 0.9 % IV SOLN
Freq: Once | INTRAVENOUS | Status: AC
  Administered 2016-04-03: 03:00:00 via INTRAVENOUS

## 2016-04-03 MED ORDER — DIPHENHYDRAMINE HCL 50 MG/ML IJ SOLN
25.0000 mg | Freq: Once | INTRAMUSCULAR | Status: AC
Start: 2016-04-03 — End: 2016-04-03
  Administered 2016-04-03: 25 mg via INTRAVENOUS
  Filled 2016-04-03: qty 1

## 2016-04-03 MED ORDER — METHOCARBAMOL 500 MG PO TABS
500.0000 mg | ORAL_TABLET | Freq: Three times a day (TID) | ORAL | Status: DC | PRN
  Administered 2016-04-03: 500 mg via ORAL
  Filled 2016-04-03: qty 1

## 2016-04-03 NOTE — Progress Notes (Signed)
eLink Physician-Brief Progress Note Patient Name: Maureen [name] DOB: [birth year] MRN: [id number]   Date of Service  04/03/2016  HPI/Events of Note  SBP 90-100's after IVF bolus. Wide pulse pressure with low MAP noted. Hgb dropping.   Recent Labs Lab 04/02/16 1409 04/02/16 1957 04/03/16 0137  HGB 9.4* 8.0* 7.1*  HCT 27.2* 23.3* 20.4*  WBC 16.5* 14.3* 12.9*  PLT 284 272 250     eICU Interventions  - will give 2u PRBC now - will insure that CCS notified regarding progressive anemia as well.      Intervention Category Major Interventions: Other:;Shock - evaluation and management  Jamier Urbas S. 04/03/2016, 2:07 AM

## 2016-04-03 NOTE — Care Management Note (Signed)
Case Management Note  Patient Details  Name: Maureen [name] MRN: [id number] Date of Birth: 1950/09/16  Subjective/Objective:       Post op hypotension requiring hemodynamic monitoring             Action/Plan: home when stable  Date:  April 03, 2016 Chart reviewed for concurrent status and case management needs. Will continue to follow patient progress. Discharge Planning: following for needs Expected discharge date: [id number] Velva Harman, BSN, [place], [place]   Expected Discharge Date:  04/05/16               Expected Discharge Plan:  Home/Self Care  In-House Referral:     Discharge planning Services     Post Acute Care Choice:    Choice offered to:     DME Arranged:    DME Agency:     HH Arranged:    HH Agency:     Status of Service:  In process, will continue to follow  If discussed at Long Length of Stay Meetings, dates discussed:    Additional Comments:  Leeroy Cha, RN 04/03/2016, 9:46 AM

## 2016-04-03 NOTE — Progress Notes (Addendum)
Paged on call surgery to notify drop in H/H and low Bp and that [facility] also notified with orders placed.    0216-Dr. Johnathan Hausen returned page, agrees with 2 units of PRBC and reassess blood pressure after that completes.

## 2016-04-03 NOTE — Progress Notes (Signed)
PULMONARY / CRITICAL CARE MEDICINE   Name: Maureen [name] MRN: [id number] DOB: March 13, 1951    ADMISSION DATE:  04/01/2016 CONSULTATION DATE:  04/02/16  REFERRING MD:  Dr. Marlou Starks   CHIEF COMPLAINT:  Hypovolemic shock, acute blood loss anemia   BRIEF:   65 y/o female with CKD, HTN, Afib on Eliquis admitted on 12/10 with bleeding post bilateral mastectomy leading to hemorrhagic shock.  Taken to OR on 12/11 for emergent cautery of some generalized oozing and one bleeding vessel.     SUBJECTIVE:  Feels much better Didn't sleep much overnight  VITAL SIGNS: BP (!) 124/33   Pulse 65   Temp 98.5 F (36.9 C) (Oral)   Resp 10   Ht 5\' 4"  (1.626 m)   Wt (!) 303 lb (137.4 kg)   SpO2 99%   BMI 52.01 kg/m   HEMODYNAMICS:    VENTILATOR SETTINGS:    INTAKE / OUTPUT: I/O last 3 completed shifts: In: 5180.3 [P.O.:540; I.V.:3270.3; Blood:1370] Out: 0737 [Urine:655; Drains:640; Other:75; Blood:100]  PHYSICAL EXAMINATION: General:  Resting comfortably HENT: NCAT OP clear PULM: CTA B, normal effort CV: Irreg irreg but normal rate GI: BS+, soft, nontender MSK: no bony deformities Neuro: Awake, alert, moves all four extremities  LABS:  BMET  Recent Labs Lab 04/02/16 0034 04/02/16 0110 04/03/16 0137  NA 136 137 133*  K 3.9 4.0 4.6  CL 98* 96* 103  CO2 29  --  25  BUN 39* 32* 41*  CREATININE 2.28* 2.40* 3.07*  GLUCOSE 228* 223* 157*    Electrolytes  Recent Labs Lab 04/02/16 0034 04/03/16 0137  CALCIUM 9.1 7.2*  MG  --  1.6*  PHOS  --  3.8    CBC  Recent Labs Lab 04/02/16 1409 04/02/16 1957 04/03/16 0137  WBC 16.5* 14.3* 12.9*  HGB 9.4* 8.0* 7.1*  HCT 27.2* 23.3* 20.4*  PLT 284 272 250    Coag's  Recent Labs Lab 04/02/16 1409  INR 1.77    Sepsis Markers No results for input(s): LATICACIDVEN, PROCALCITON, O2SATVEN in the last 168 hours.  ABG No results for input(s): PHART, PCO2ART, PO2ART in the last 168 hours.  Liver Enzymes No results for  input(s): AST, ALT, ALKPHOS, BILITOT, ALBUMIN in the last 168 hours.  Cardiac Enzymes  Recent Labs Lab 04/02/16 1409 04/02/16 1957 04/03/16 0137  TROPONINI 0.11* 0.09* 0.07*    Glucose  Recent Labs Lab 04/02/16 1339 04/02/16 1544 04/02/16 1946 04/02/16 2323 04/03/16 0411 04/03/16 0736  GLUCAP 116* 117* 154* 160* 144* 125*    Imaging Dg Chest Port 1 View  Result Date: 04/02/2016 CLINICAL DATA:  Central line placement EXAM: PORTABLE CHEST 1 VIEW COMPARISON:  06/01/2014 FINDINGS: Right central line tip is in the SVC. No pneumothorax. Mild cardiomegaly. Lung bases are not visualized. No confluent opacity in the visualized lungs. No acute bony abnormality. IMPRESSION: Right central line tip in the SVC.  No pneumothorax. Mild cardiomegaly. Electronically Signed   By: Rolm Baptise M.D.   On: 04/02/2016 10:10     STUDIES:    CULTURES:   ANTIBIOTICS:   SIGNIFICANT EVENTS: 12/10  Admit with pain, bleeding and swelling at surgical site of mastectomy (L) 12/11  PCCM consulted for hypotension, anemia > tx to ICU. To OR for eval of hematoma / bleeding  LINES/TUBES: R IJ TLC 12/11 >>   DISCUSSION: 65 y/o F with PMH of recent bilateral mastectomy admitted 12/11 with hemorrhage from the left mastectomy site causing hemorrhagic shock.  Improved by 12/12.  ASSESSMENT / PLAN:  PULMONARY A: Hx OSA on CPAP  P:   Continue noctural CPAP O2 as needed to support sats >92%   CARDIOVASCULAR A:  Hemorrhagic shock> improved Hx HTN, CHF, AF/A-Flutter P:  Hold further volume resuscitation Hold anticoagulation Continue coreg IV lasix now Tele  RENAL A:   Acute Kidney Injury on CKD- could be ATN considering shock on 12/11; borderline urine output 04/03/2016; volume up P:   Lasix x1 dose 12/12 KVO IVF Monitor BMET and UOP Replace electrolytes as needed  GASTROINTESTINAL A:   Nausea / Vomiting Obesity  P:   PRN zofran  Advance diet as tolerate Colace for  constipation Bedside commode   HEMATOLOGIC / ONC A:   Acute Blood Loss Anemia > not bleeding Left High Grade Ductal Carcinoma - dx 01/06/16, s/p mastectomy 11/29 P:  OR now for evacuation of hematoma / source control  Hold lovenox  SCD's for DVT prophylaxis  Only transfuse if Hgb < 7gm/dL   INFECTIOUS A:   No acute infectious process  P:   Trend WBC/fever curve  Monitor surgical sites   ENDOCRINE A:   DM  Hypothyroidism  P:   Continue SSI today, will need lantus if eats more today Oral synthroid 148mcg daily  NEUROLOGIC A:   Mild Encephalopathy - resolved Fibromyalgia  P:   Minimize sedating medications  Continue elavil to 25 mg QHS PRN dilaudid for pain    FAMILY  - Updates: no family bedside 12/12.  - Inter-disciplinary family meet or Palliative Care meeting due by: 12/18  Out of bed PT consult Arterial line out  Consider move to tele  Roselie Awkward, MD [facility] Pager: [phone] Cell: [phone] After 3pm or if no response, call [phone]

## 2016-04-03 NOTE — Progress Notes (Signed)
[facility] Progress Note Patient Name: Maureen [name] DOB: [birth year] MRN: [id number]   Date of Service  04/03/2016  HPI/Events of Note  Chart reviewed. Pt has had hypotension this pm, was normotensive immediately post-op hematoma evacuation. Received 3u PRBC today. Currently looks comfortable on CPAP. BP 85/27 by art line.   eICU Interventions  Repeat IVF bolus now Await next CBC, note some drop most recent Hgb.       Intervention Category Major Interventions: Shock - evaluation and management  Shiori Adcox S. 04/03/2016, 12:30 AM

## 2016-04-03 NOTE — Progress Notes (Signed)
1 Day Post-Op  Subjective: Complains of chest wall pain  Objective: Vital signs in last 24 hours: Temp:  [98.2 F (36.8 C)-99.3 F (37.4 C)] 98.8 F (37.1 C) (12/12 1600) Pulse Rate:  [54-66] 65 (12/12 1900) Resp:  [10-21] 16 (12/12 1900) BP: (68-135)/(26-95) 132/35 (12/12 1900) SpO2:  [94 %-100 %] 99 % (12/12 1900) Arterial Line BP: (81-136)/(25-50) 124/43 (12/12 0900) Last BM Date: 04/02/16  Intake/Output from previous day: 12/11 0701 - 12/12 0700 In: 4580.3 [P.O.:420; I.V.:2790.3; Blood:1370] Out: 1295 [Urine:655; Drains:540; Blood:100] Intake/Output this shift: No intake/output data recorded.  Resp: clear to auscultation bilaterally Cardio: regular rate and rhythm GI: soft, non-tender; bowel sounds normal; no masses,  no organomegaly  Chest wall soft. No evidence of hematoma  Lab Results:   Recent Labs  04/03/16 0851 04/03/16 1445  WBC 14.5* 13.4*  HGB 9.5* 9.4*  HCT 27.0* 27.2*  PLT 255 258   BMET  Recent Labs  04/02/16 0034 04/02/16 0110 04/03/16 0137  NA 136 137 133*  K 3.9 4.0 4.6  CL 98* 96* 103  CO2 29  --  25  GLUCOSE 228* 223* 157*  BUN 39* 32* 41*  CREATININE 2.28* 2.40* 3.07*  CALCIUM 9.1  --  7.2*   PT/INR  Recent Labs  04/02/16 1409  LABPROT 20.8*  INR 1.77   ABG No results for input(s): PHART, HCO3 in the last 72 hours.  Invalid input(s): PCO2, PO2  Studies/Results: Dg Chest Port 1 View  Result Date: 04/02/2016 CLINICAL DATA:  Central line placement EXAM: PORTABLE CHEST 1 VIEW COMPARISON:  06/01/2014 FINDINGS: Right central line tip is in the SVC. No pneumothorax. Mild cardiomegaly. Lung bases are not visualized. No confluent opacity in the visualized lungs. No acute bony abnormality. IMPRESSION: Right central line tip in the SVC.  No pneumothorax. Mild cardiomegaly. Electronically Signed   By: Rolm Baptise M.D.   On: 04/02/2016 10:10    Anti-infectives: Anti-infectives    Start     Dose/Rate Route Frequency Ordered Stop    04/02/16 1115  vancomycin (VANCOCIN) 1,000 mg in sodium chloride 0.9 % 250 mL IVPB     1,000 mg 250 mL/hr over 60 Minutes Intravenous  Once 04/02/16 1102 04/02/16 1050      Assessment/Plan: s/p Procedure(s): EVACUATION HEMATOMA LEFT BREAST (Left) Advance diet  Continue to work on pain control Hg drifting down. Likely equilibration. Drain output non bloody. Transfuse as needed Monitor cr. Still rising today but she is making some urine  LOS: 1 day    TOTH III,Allyna Pittsley S 04/03/2016

## 2016-04-04 DIAGNOSIS — Z452 Encounter for adjustment and management of vascular access device: Secondary | ICD-10-CM

## 2016-04-04 LAB — GLUCOSE, CAPILLARY
GLUCOSE-CAPILLARY: 123 mg/dL — AB (ref 65–99)
GLUCOSE-CAPILLARY: 135 mg/dL — AB (ref 65–99)
GLUCOSE-CAPILLARY: 182 mg/dL — AB (ref 65–99)
GLUCOSE-CAPILLARY: 78 mg/dL (ref 65–99)
GLUCOSE-CAPILLARY: 98 mg/dL (ref 65–99)
Glucose-Capillary: 70 mg/dL (ref 65–99)
Glucose-Capillary: 130 mg/dL — ABNORMAL HIGH (ref 65–99)
Glucose-Capillary: 148 mg/dL — ABNORMAL HIGH (ref 65–99)

## 2016-04-04 LAB — CBC WITH DIFFERENTIAL/PLATELET
BASOS ABS: 0 10*3/uL (ref 0.0–0.1)
BASOS PCT: 0 %
EOS PCT: 5 %
Eosinophils Absolute: 0.6 10*3/uL (ref 0.0–0.7)
HEMATOCRIT: 24.6 % — AB (ref 36.0–46.0)
Hemoglobin: 8.5 g/dL — ABNORMAL LOW (ref 12.0–15.0)
Lymphocytes Relative: 15 %
Lymphs Abs: 1.8 10*3/uL (ref 0.7–4.0)
MCH: 30.4 pg (ref 26.0–34.0)
MCHC: 34.6 g/dL (ref 30.0–36.0)
MCV: 87.9 fL (ref 78.0–100.0)
MONO ABS: 0.7 10*3/uL (ref 0.1–1.0)
Monocytes Relative: 6 %
NEUTROS ABS: 8.4 10*3/uL — AB (ref 1.7–7.7)
Neutrophils Relative %: 74 %
Platelets: 249 10*3/uL (ref 150–400)
RBC: 2.8 MIL/uL — ABNORMAL LOW (ref 3.87–5.11)
RDW: 15.4 % (ref 11.5–15.5)
WBC: 11.4 10*3/uL — ABNORMAL HIGH (ref 4.0–10.5)

## 2016-04-04 LAB — BASIC METABOLIC PANEL
ANION GAP: 4 — AB (ref 5–15)
BUN: 36 mg/dL — AB (ref 6–20)
CALCIUM: 7.6 mg/dL — AB (ref 8.9–10.3)
CO2: 27 mmol/L (ref 22–32)
CREATININE: 2.82 mg/dL — AB (ref 0.44–1.00)
Chloride: 102 mmol/L (ref 101–111)
GFR calc Af Amer: 19 mL/min — ABNORMAL LOW (ref >60)
GFR, EST NON AFRICAN AMERICAN: 16 mL/min — AB (ref >60)
GLUCOSE: 76 mg/dL (ref 65–99)
Potassium: 4.2 mmol/L (ref 3.5–5.1)
Sodium: 133 mmol/L — ABNORMAL LOW (ref 135–145)

## 2016-04-04 MED ORDER — FUROSEMIDE 40 MG PO TABS
40.0000 mg | ORAL_TABLET | Freq: Every day | ORAL | Status: DC
  Administered 2016-04-04 – 2016-04-05 (×2): 40 mg via ORAL
  Filled 2016-04-04 (×2): qty 1

## 2016-04-04 MED ORDER — HYDROMORPHONE HCL 2 MG/ML IJ SOLN: 0.5000 mg | INTRAMUSCULAR | Status: DC | PRN

## 2016-04-04 NOTE — Progress Notes (Signed)
RN received report from ICU, Pt arrived unit, alert and oriented. Will continue with current plan of care.

## 2016-04-04 NOTE — Progress Notes (Signed)
PULMONARY / CRITICAL CARE MEDICINE   Name: Maureen [name] MRN: [id number] DOB: 04/29/1950    ADMISSION DATE:  04/01/2016 CONSULTATION DATE:  04/02/16  REFERRING MD:  Dr. Marlou Starks   CHIEF COMPLAINT:  Hypovolemic shock, acute blood loss anemia   BRIEF:   65 y/o female with CKD, HTN, Afib on Eliquis admitted on 12/10 with bleeding post bilateral mastectomy leading to hemorrhagic shock.  Taken to OR on 12/11 for emergent cautery of some generalized oozing and one bleeding vessel.     SUBJECTIVE:  Feels much better but wants to get out of bed   VITAL SIGNS: BP 115/80   Pulse 67   Temp 98.3 F (36.8 C) (Oral)   Resp 18   Ht 5\' 4"  (1.626 m)   Wt (!) 303 lb (137.4 kg)   SpO2 93%   BMI 52.01 kg/m   HEMODYNAMICS:    VENTILATOR SETTINGS:    INTAKE / OUTPUT: I/O last 3 completed shifts: In: 3607.3 [P.O.:420; I.V.:2517.3; Blood:670] Out: 9678 [LFYBO:1751; Drains:580]  PHYSICAL EXAMINATION: General:  Resting comfortably HENT: NCAT OP clear PULM: CTA B, normal effort CV: Irreg irreg but normal rate GI: BS+, soft, nontender MSK: no bony deformities Neuro: Awake, alert, moves all four extremities  LABS:  BMET  Recent Labs Lab 04/02/16 0034 04/02/16 0110 04/03/16 0137 04/04/16 0404  NA 136 137 133* 133*  K 3.9 4.0 4.6 4.2  CL 98* 96* 103 102  CO2 29  --  25 27  BUN 39* 32* 41* 36*  CREATININE 2.28* 2.40* 3.07* 2.82*  GLUCOSE 228* 223* 157* 76    Electrolytes  Recent Labs Lab 04/02/16 0034 04/03/16 0137 04/04/16 0404  CALCIUM 9.1 7.2* 7.6*  MG  --  1.6*  --   PHOS  --  3.8  --     CBC  Recent Labs Lab 04/03/16 1445 04/03/16 1959 04/04/16 0404  WBC 13.4* 13.2* 11.4*  HGB 9.4* 8.9* 8.5*  HCT 27.2* 26.1* 24.6*  PLT 258 260 249    Coag's  Recent Labs Lab 04/02/16 1409  INR 1.77    Sepsis Markers No results for input(s): LATICACIDVEN, PROCALCITON, O2SATVEN in the last 168 hours.  ABG No results for input(s): PHART, PCO2ART, PO2ART in  the last 168 hours.  Liver Enzymes No results for input(s): AST, ALT, ALKPHOS, BILITOT, ALBUMIN in the last 168 hours.  Cardiac Enzymes  Recent Labs Lab 04/02/16 1409 04/02/16 1957 04/03/16 0137  TROPONINI 0.11* 0.09* 0.07*    Glucose  Recent Labs Lab 04/03/16 0411 04/03/16 0736 04/03/16 1140 04/03/16 1607 04/04/16 0332 04/04/16 0727  GLUCAP 144* 125* 164* 116* 70 78    Imaging No results found.   STUDIES:    CULTURES:   ANTIBIOTICS:   SIGNIFICANT EVENTS: 12/10  Admit with pain, bleeding and swelling at surgical site of mastectomy (L) 12/11  PCCM consulted for hypotension, anemia > tx to ICU. To OR for eval of hematoma / bleeding  LINES/TUBES: R IJ TLC 12/11 >>   DISCUSSION: 65 y/o F with PMH of recent bilateral mastectomy admitted 12/11 with hemorrhage from the left mastectomy site causing hemorrhagic shock.  Improved by 12/12.  Now with normal vital signs, improving Cr as of 12/13.    ASSESSMENT / PLAN:  PULMONARY A: Hx OSA on CPAP  P:   Continue noctural CPAP O2 as needed to support sats >92%   CARDIOVASCULAR A:  Hemorrhagic shock> resolved Hx HTN, CHF, AF/A-Flutter P:  Resume home dose of lasix Hold anticoagulation,  when OK by surgery would add back anticoagulation for stroke prevention; if surgery more comfortable it is OK to start with aspirin 325 (EC) daily for a week prior to starting Eliquis again Continue coreg Tele  RENAL A:   Acute Kidney Injury on CKD- could be ATN considering shock on 12/11; borderline urine output 04/04/2016; volume up P:   KVO IVF Monitor BMET and UOP Replace electrolytes as needed  GASTROINTESTINAL A:   Nausea / Vomiting Obesity  P:   PRN zofran  Advance diet as tolerate Colace for constipation Bedside commode   HEMATOLOGIC / ONC A:   Acute Blood Loss Anemia > not bleeding Left High Grade Ductal Carcinoma - dx 01/06/16, 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  - Inter-disciplinary family meet or Palliative Care meeting due by: 12/18  Move to tele  PCCM to sign off, if further medicine support helpful, would recommend consulting hospitalist service.   Roselie Awkward, MD [facility] Pager: [phone] Cell: [phone] After 3pm or if no response, call [phone]

## 2016-04-04 NOTE — Progress Notes (Signed)
Foley removed. Patient has voided.

## 2016-04-04 NOTE — Progress Notes (Signed)
2 Days Post-Op  Subjective: Feels better. Less pain  Objective: Vital signs in last 24 hours: Temp:  [98.1 F (36.7 C)-98.9 F (37.2 C)] 98.8 F (37.1 C) (12/13 1405) Pulse Rate:  [51-70] 64 (12/13 1405) Resp:  [10-20] 18 (12/13 1405) BP: (88-148)/(30-80) 131/61 (12/13 1405) SpO2:  [93 %-100 %] 96 % (12/13 1405) Last BM Date: 04/01/16  Intake/Output from previous day: 12/12 0701 - 12/13 0700 In: 1194.1 [I.V.:859.1; Blood:335] Out: 7209 [Urine:3100; Drains:420] Intake/Output this shift: Total I/O In: 120 [P.O.:120] Out: -   Resp: clear to auscultation bilaterally Chest wall: skin flaps healthy. drain output serous Cardio: regular rate and rhythm GI: soft, non-tender; bowel sounds normal; no masses,  no organomegaly  Lab Results:   Recent Labs  04/03/16 1959 04/04/16 0404  WBC 13.2* 11.4*  HGB 8.9* 8.5*  HCT 26.1* 24.6*  PLT 260 249   BMET  Recent Labs  04/03/16 0137 04/04/16 0404  NA 133* 133*  K 4.6 4.2  CL 103 102  CO2 25 27  GLUCOSE 157* 76  BUN 41* 36*  CREATININE 3.07* 2.82*  CALCIUM 7.2* 7.6*   PT/INR  Recent Labs  04/02/16 1409  LABPROT 20.8*  INR 1.77   ABG No results for input([name]): PHART, HCO3 in the last 72 hours.  Invalid input([name]): PCO2, PO2  Studies/Results: No results found.  Anti-infectives: Anti-infectives    Start     Dose/Rate Route Frequency Ordered Stop   04/02/16 1115  vancomycin (VANCOCIN) 1,000 mg in sodium chloride 0.9 % 250 mL IVPB     1,000 mg 250 mL/hr over 60 Minutes Intravenous  Once 04/02/16 1102 04/02/16 1050      Assessment/Plan: [name]/p Procedure([name]): EVACUATION HEMATOMA LEFT BREAST (Left) Advance diet  Monitor hg Monitor cr D/c foley Hopefully home soon  LOS: 2 days    TOTH III,Maureen [name] 04/04/2016

## 2016-04-04 NOTE — Evaluation (Signed)
Physical Therapy Evaluation Patient Details Name: Maureen [name] MRN: [id number] DOB: October 17, 1950 Today's Date: 04/04/2016   History of Present Illness  Pt admitted with hematoma and hemorrhagic shock s.p Bilat mastectomy 03/21/16.  Hematoma evacuation 04/02/16.  Pt with hx of DM, CHF, A-fib and Breast CA  Clinical Impression  Pt admitted as above and presenting with functional mobility limitations 2* post op discomfort and mild ambulatory balance deficits.  Pt should progress to dc home with family assist.    Follow Up Recommendations No PT follow up    Equipment Recommendations  None recommended by PT    Recommendations for Other Services       Precautions / Restrictions Precautions Precautions: Other (comment) Precaution Comments: Dressings in place s/p mastectomy Restrictions Weight Bearing Restrictions: No      Mobility  Bed Mobility               General bed mobility comments: NT OOB with nursing  Transfers Overall transfer level: Needs assistance Equipment used: None Transfers: Sit to/from Stand Sit to Stand: Min guard         General transfer comment: cues for safety in transition   Ambulation/Gait Ambulation/Gait assistance: Min guard Ambulation Distance (Feet): 450 Feet Assistive device: Rolling walker (2 wheeled) Gait Pattern/deviations: WFL(Within Functional Limits)     General Gait Details: cues for pace, posture and position from RW  Stairs            Wheelchair Mobility    Modified Rankin (Stroke Patients Only)       Balance                                             Pertinent Vitals/Pain Pain Assessment: Faces Faces Pain Scale: Hurts a little bit Pain Intervention(s): Limited activity within patient's tolerance;Monitored during session    Home Living Family/patient expects to be discharged to:: Private residence Living Arrangements: Other relatives Available Help at Discharge: Family Type of  Home: House         Home Equipment: None      Prior Function Level of Independence: Independent               Hand Dominance        Extremity/Trunk Assessment   Upper Extremity Assessment Upper Extremity Assessment: Overall WFL for tasks assessed    Lower Extremity Assessment Lower Extremity Assessment: Overall WFL for tasks assessed    Cervical / Trunk Assessment Cervical / Trunk Assessment: Normal  Communication   Communication: No difficulties  Cognition Arousal/Alertness: Awake/alert Behavior During Therapy: WFL for tasks assessed/performed Overall Cognitive Status: Within Functional Limits for tasks assessed                      General Comments      Exercises     Assessment/Plan    PT Assessment Patient needs continued PT services  PT Problem List Decreased activity tolerance;Decreased mobility;Decreased knowledge of use of DME;Obesity;Pain          PT Treatment Interventions DME instruction;Gait training;Stair training;Functional mobility training;Therapeutic activities;Therapeutic exercise;Patient/family education    PT Goals (Current goals can be found in the Care Plan section)  Acute Rehab PT Goals Patient Stated Goal: HOME PT Goal Formulation: With patient Time For Goal Achievement: 04/18/16 Potential to Achieve Goals: Good    Frequency Min 3X/week   Barriers to  discharge        Co-evaluation               End of Session   Activity Tolerance: Patient tolerated treatment well Patient left: in chair;with call bell/phone within reach;with family/visitor present Nurse Communication: Mobility status         Time: 1140-1200 PT Time Calculation (min) (ACUTE ONLY): 20 min   Charges:   PT Evaluation $PT Eval Low Complexity: 1 Procedure     PT G Codes:        Bradleigh Sonnen 04-09-16, 1:03 PM

## 2016-04-05 LAB — CBC
HEMATOCRIT: 26 % — AB (ref 36.0–46.0)
HEMOGLOBIN: 8.7 g/dL — AB (ref 12.0–15.0)
MCH: 29.9 pg (ref 26.0–34.0)
MCHC: 33.5 g/dL (ref 30.0–36.0)
MCV: 89.3 fL (ref 78.0–100.0)
PLATELETS: 252 10*3/uL (ref 150–400)
RBC: 2.91 MIL/uL — AB (ref 3.87–5.11)
RDW: 15.2 % (ref 11.5–15.5)
WBC: 8.9 10*3/uL (ref 4.0–10.5)

## 2016-04-05 LAB — BASIC METABOLIC PANEL
ANION GAP: 7 (ref 5–15)
BUN: 37 mg/dL — ABNORMAL HIGH (ref 6–20)
CHLORIDE: 102 mmol/L (ref 101–111)
CO2: 26 mmol/L (ref 22–32)
CREATININE: 2.49 mg/dL — AB (ref 0.44–1.00)
Calcium: 8.3 mg/dL — ABNORMAL LOW (ref 8.9–10.3)
GFR calc non Af Amer: 19 mL/min — ABNORMAL LOW (ref >60)
GFR, EST AFRICAN AMERICAN: 22 mL/min — AB (ref >60)
Glucose, Bld: 75 mg/dL (ref 65–99)
POTASSIUM: 4.3 mmol/L (ref 3.5–5.1)
SODIUM: 135 mmol/L (ref 135–145)

## 2016-04-05 LAB — GLUCOSE, CAPILLARY
GLUCOSE-CAPILLARY: 72 mg/dL (ref 65–99)
Glucose-Capillary: 80 mg/dL (ref 65–99)

## 2016-04-05 MED ORDER — HYDROMORPHONE HCL 4 MG PO TABS: 2.0000 mg | ORAL_TABLET | Freq: Four times a day (QID) | ORAL | 0 refills | Status: DC | PRN

## 2016-04-05 MED ORDER — ZOLPIDEM TARTRATE 5 MG PO TABS
5.0000 mg | ORAL_TABLET | Freq: Once | ORAL | Status: AC
  Administered 2016-04-05: 5 mg via ORAL
  Filled 2016-04-05: qty 1

## 2016-04-05 MED ORDER — DIPHENHYDRAMINE HCL 25 MG PO CAPS
25.0000 mg | ORAL_CAPSULE | Freq: Four times a day (QID) | ORAL | Status: DC | PRN
  Administered 2016-04-05: 25 mg via ORAL
  Filled 2016-04-05: qty 1

## 2016-04-05 NOTE — Progress Notes (Signed)
Date: April 05, 2016 Discharge orders checked for needs. No case management needs present at time of discharge. Velva Harman, RN, BSN, [place]   [phone]

## 2016-04-05 NOTE — Progress Notes (Signed)
3 Days Post-Op  Subjective: Pain gradually improving. Drain output serosanguinous  Objective: Vital signs in last 24 hours: Temp:  [98 F (36.7 C)-98.8 F (37.1 C)] 98.6 F (37 C) (12/14 0445) Pulse Rate:  [63-70] 64 (12/14 0445) Resp:  [18-20] 18 (12/14 0445) BP: (117-148)/(29-76) 127/43 (12/14 0445) SpO2:  [96 %-99 %] 98 % (12/14 0445) Last BM Date: 04/01/16  Intake/Output from previous day: 12/13 0701 - 12/14 0700 In: 640 [P.O.:600; I.V.:40] Out: 790 [Urine:450; Drains:340] Intake/Output this shift: No intake/output data recorded.  Resp: clear to auscultation bilaterally Chest wall: soft, skin flaps healthy Cardio: regular rate and rhythm GI: soft, non-tender; bowel sounds normal; no masses,  no organomegaly  Lab Results:   Recent Labs  04/04/16 0404 04/05/16 0537  WBC 11.4* 8.9  HGB 8.5* 8.7*  HCT 24.6* 26.0*  PLT 249 252   BMET  Recent Labs  04/04/16 0404 04/05/16 0537  NA 133* 135  K 4.2 4.3  CL 102 102  CO2 27 26  GLUCOSE 76 75  BUN 36* 37*  CREATININE 2.82* 2.49*  CALCIUM 7.6* 8.3*   PT/INR  Recent Labs  04/02/16 1409  LABPROT 20.8*  INR 1.77   ABG No results for input(s): PHART, HCO3 in the last 72 hours.  Invalid input(s): PCO2, PO2  Studies/Results: No results found.  Anti-infectives: Anti-infectives    Start     Dose/Rate Route Frequency Ordered Stop   04/02/16 1115  vancomycin (VANCOCIN) 1,000 mg in sodium chloride 0.9 % 250 mL IVPB     1,000 mg 250 mL/hr over 60 Minutes Intravenous  Once 04/02/16 1102 04/02/16 1050      Assessment/Plan: s/p Procedure(s): EVACUATION HEMATOMA LEFT BREAST (Left) Advance diet Discharge  LOS: 3 days    TOTH III,Yazan Gatling S 04/05/2016

## 2016-04-05 NOTE — Progress Notes (Signed)
Patient given discharge instructions and prescription. No questions or concerns at this time.

## 2016-04-06 LAB — TYPE AND SCREEN
ABO/RH(D): O POS
ANTIBODY SCREEN: NEGATIVE
UNIT DIVISION: 0
UNIT DIVISION: 0
UNIT DIVISION: 0
Unit division: 0
Unit division: 0
Unit division: 0

## 2016-04-12 DIAGNOSIS — I1310 Hypertensive heart and chronic kidney disease without heart failure, with stage 1 through stage 4 chronic kidney disease, or unspecified chronic kidney disease: Secondary | ICD-10-CM | POA: Diagnosis not present

## 2016-04-12 DIAGNOSIS — N189 Chronic kidney disease, unspecified: Secondary | ICD-10-CM | POA: Diagnosis not present

## 2016-04-12 DIAGNOSIS — D649 Anemia, unspecified: Secondary | ICD-10-CM | POA: Diagnosis not present

## 2016-04-14 NOTE — Discharge Summary (Signed)
Physician Discharge Summary  Patient ID: Maureen [name] MRN: [id number] DOB/AGE: 66-Jan-1952 66 y.o.  Admit date: 04/01/2016 Discharge date: 04/14/2016  Admission Diagnoses:  Discharge Diagnoses:  Active Problems:   Hematoma (nontraumatic) of breast   Anemia   Acute blood loss anemia   Hypotension   Encounter for central line placement   Discharged Condition: good  Hospital Course:  The patient was readmitted about 2 weeks after a bilateral mastectomy was performed. She apparently had a spontaneous bleed under the left mastectomy flap. She was taken back to the operating room where the hematoma was evacuated and the bleeding was controlled. She underwent appropriate resuscitation. She gradually improved until her hemoglobin was stable and there were no further signs of bleeding and she was ready for discharge home.  Consults: None  Significant Diagnostic Studies: none  Treatments: surgery: As above  Discharge Exam: Blood pressure (!) 127/43, pulse 64, temperature 98.6 F (37 C), temperature source Oral, resp. rate 18, height 5\' 4"  (1.626 m), weight (!) 137.4 kg (303 lb), SpO2 98 %. Resp: clear to auscultation bilaterally Chest wall: The skin flaps were healthy with no further sign of bleeding Cardio: regular rate and rhythm GI: soft, non-tender; bowel sounds normal; no masses,  no organomegaly  Disposition: 01-Home or Self Care  Discharge Instructions    Call MD for:  difficulty breathing, headache or visual disturbances    Complete by:  As directed    Call MD for:  extreme fatigue    Complete by:  As directed    Call MD for:  hives    Complete by:  As directed    Call MD for:  persistant dizziness or light-headedness    Complete by:  As directed    Call MD for:  persistant nausea and vomiting    Complete by:  As directed    Call MD for:  redness, tenderness, or signs of infection (pain, swelling, redness, odor or green/yellow discharge around incision site)     Complete by:  As directed    Call MD for:  severe uncontrolled pain    Complete by:  As directed    Call MD for:  temperature >100.4    Complete by:  As directed    Diet - low sodium heart healthy    Complete by:  As directed    Discharge instructions    Complete by:  As directed    Sponge bathe while drains are in. Empty drain, record output, and recharge bulb twice a day. No overhead activity   Increase activity slowly    Complete by:  As directed    No wound care    Complete by:  As directed      Allergies as of 04/05/2016      Reactions   Cephalexin Hives   Codeine Other (See Comments)   Heartburn    Doxycycline Itching   Hydrocodone Itching   Penicillin G Other (See Comments)   PATIENT DOESN'T REMEMBER [DOES HAVE ALLERGIC RESPONSE TO CEPHALEXIN > HIVES] Has patient had a PCN reaction causing immediate rash, facial/tongue/throat swelling, SOB or lightheadedness with hypotension: unknown Has patient had a PCN reaction causing severe rash involving mucus membranes or skin necrosis: unknown Has patient had a PCN reaction that required hospitalization: unknown Has patient had a PCN reaction occurring within the last 10 years: unknown If all of the above answers are "NO", th   Percodan [oxycodone-aspirin] Itching      Medication List    TAKE  these medications   amiodarone 200 MG tablet Commonly known as:  PACERONE TAKE 1 TABLET ONCE A DAY   amitriptyline 50 MG tablet Commonly known as:  ELAVIL TAKE 1 TABLET BY MOUTH AT BEDTIME   carvedilol 6.25 MG tablet Commonly known as:  COREG TAKE 1 TABLET BY MOUTH TWICE A DAY   clindamycin 300 MG capsule Commonly known as:  CLEOCIN Take 600 mg by mouth 2 (two) times daily as needed (for dental procedures).   Cyanocobalamin 1000 MCG/ML Liqd Inject 1,000 mcg as directed every 30 days.   doxazosin 4 MG tablet Commonly known as:  CARDURA Take 4 mg by mouth at bedtime.   ELIQUIS 5 MG Tabs tablet Generic drug:   apixaban TAKE 1 TABLET EVERY 12 HOURS   fenofibrate 160 MG tablet TAKE 1 TABLET BY MOUTH AT BEDTIME   furosemide 40 MG tablet Commonly known as:  LASIX take 40 to 80 mgs daily based on weight gain   HYDROmorphone 2 MG tablet Commonly known as:  DILAUDID Take 0.5 tablets (1 mg total) by mouth every 2 (two) hours as needed for severe pain. What changed:  Another medication with the same name was added. Make sure you understand how and when to take each.   HYDROmorphone 4 MG tablet Commonly known as:  DILAUDID Take 0.5-2 tablets (2-8 mg total) by mouth every 6 (six) hours as needed for severe pain. What changed:  You were already taking a medication with the same name, and this prescription was added. Make sure you understand how and when to take each.   levothyroxine 100 MCG tablet Commonly known as:  SYNTHROID, LEVOTHROID Take 100 mcg by mouth at bedtime.   loperamide 2 MG tablet Commonly known as:  IMODIUM A-D Take 4 mg by mouth 4 (four) times daily as needed for diarrhea or loose stools.   metFORMIN 500 MG 24 hr tablet Commonly known as:  GLUCOPHAGE-XR TAKE 1 TABLETS BY MOUTH TWICE A DAY   tiZANidine 4 MG tablet Commonly known as:  ZANAFLEX take 8mg s by mouth at bedtime   traMADol 50 MG tablet Commonly known as:  ULTRAM takes 100mg s twice daily as needed for pain   VICTOZA 18 MG/3ML Sopn Generic drug:  liraglutide Inject 0.6 mg into the skin daily.   VITAMIN D (ERGOCALCIFEROL) PO Take 500 Units by mouth 2 (two) times daily.   zolpidem 10 MG tablet Commonly known as:  AMBIEN TAKE 1 TABLET BY MOUTH AT BEDTIME        Signed: TOTH III,Alonzo Loving S 04/14/2016, 9:42 PM

## 2016-05-17 DIAGNOSIS — G4733 Obstructive sleep apnea (adult) (pediatric): Secondary | ICD-10-CM | POA: Diagnosis not present

## 2016-05-23 DIAGNOSIS — D649 Anemia, unspecified: Secondary | ICD-10-CM | POA: Diagnosis not present

## 2016-05-23 DIAGNOSIS — N183 Chronic kidney disease, stage 3 (moderate): Secondary | ICD-10-CM | POA: Diagnosis not present

## 2016-05-23 DIAGNOSIS — I10 Essential (primary) hypertension: Secondary | ICD-10-CM | POA: Diagnosis not present

## 2016-05-23 DIAGNOSIS — I5032 Chronic diastolic (congestive) heart failure: Secondary | ICD-10-CM | POA: Diagnosis not present

## 2016-05-29 DIAGNOSIS — N6489 Other specified disorders of breast: Secondary | ICD-10-CM | POA: Diagnosis not present

## 2016-05-30 DIAGNOSIS — I10 Essential (primary) hypertension: Secondary | ICD-10-CM | POA: Diagnosis not present

## 2016-05-30 DIAGNOSIS — E039 Hypothyroidism, unspecified: Secondary | ICD-10-CM | POA: Diagnosis not present

## 2016-05-30 DIAGNOSIS — E119 Type 2 diabetes mellitus without complications: Secondary | ICD-10-CM | POA: Diagnosis not present

## 2016-05-31 ENCOUNTER — Other Ambulatory Visit: Payer: Self-pay | Admitting: General Surgery

## 2016-05-31 DIAGNOSIS — N6489 Other specified disorders of breast: Secondary | ICD-10-CM

## 2016-06-01 ENCOUNTER — Ambulatory Visit
Admission: RE | Admit: 2016-06-01 | Discharge: 2016-06-01 | Disposition: A | Discharge status: Home / Self Care | Payer: PPO | Source: Ambulatory Visit | Attending: General Surgery | Admitting: General Surgery

## 2016-06-01 ENCOUNTER — Other Ambulatory Visit: Payer: Self-pay | Admitting: General Surgery

## 2016-06-01 DIAGNOSIS — N6489 Other specified disorders of breast: Secondary | ICD-10-CM

## 2016-06-01 DIAGNOSIS — N6310 Unspecified lump in the right breast, unspecified quadrant: Secondary | ICD-10-CM | POA: Diagnosis not present

## 2016-06-06 LAB — AEROBIC/ANAEROBIC CULTURE (SURGICAL/DEEP WOUND): SPECIAL REQUESTS: NORMAL

## 2016-06-06 LAB — AEROBIC/ANAEROBIC CULTURE W GRAM STAIN (SURGICAL/DEEP WOUND): Culture: NO GROWTH

## 2016-06-20 DIAGNOSIS — N189 Chronic kidney disease, unspecified: Secondary | ICD-10-CM | POA: Diagnosis not present

## 2016-06-20 DIAGNOSIS — D649 Anemia, unspecified: Secondary | ICD-10-CM | POA: Diagnosis not present

## 2016-06-20 DIAGNOSIS — I1310 Hypertensive heart and chronic kidney disease without heart failure, with stage 1 through stage 4 chronic kidney disease, or unspecified chronic kidney disease: Secondary | ICD-10-CM | POA: Diagnosis not present

## 2016-06-21 ENCOUNTER — Telehealth: Payer: Self-pay | Admitting: Hematology and Oncology

## 2016-06-21 NOTE — Telephone Encounter (Signed)
lvm to inform pt of SCP appt in March. Letter mailed 3/1

## 2016-07-16 DIAGNOSIS — R062 Wheezing: Secondary | ICD-10-CM | POA: Diagnosis not present

## 2016-07-16 DIAGNOSIS — R05 Cough: Secondary | ICD-10-CM | POA: Diagnosis not present

## 2016-07-16 DIAGNOSIS — R0602 Shortness of breath: Secondary | ICD-10-CM | POA: Diagnosis not present

## 2016-07-16 DIAGNOSIS — J069 Acute upper respiratory infection, unspecified: Secondary | ICD-10-CM | POA: Diagnosis not present

## 2016-07-20 ENCOUNTER — Encounter: Payer: PPO | Admitting: Adult Health

## 2016-07-20 ENCOUNTER — Encounter: Payer: Self-pay | Admitting: Adult Health

## 2016-07-31 ENCOUNTER — Telehealth: Payer: Self-pay | Admitting: Adult Health

## 2016-07-31 NOTE — Telephone Encounter (Signed)
sw pt to confirm r/s SCP appt to 4/26 at 130 pm per telephone message

## 2016-08-06 DIAGNOSIS — I10 Essential (primary) hypertension: Secondary | ICD-10-CM | POA: Diagnosis not present

## 2016-08-06 DIAGNOSIS — I480 Paroxysmal atrial fibrillation: Secondary | ICD-10-CM | POA: Diagnosis not present

## 2016-08-06 DIAGNOSIS — G4733 Obstructive sleep apnea (adult) (pediatric): Secondary | ICD-10-CM | POA: Diagnosis not present

## 2016-08-06 DIAGNOSIS — Z6841 Body Mass Index (BMI) 40.0 and over, adult: Secondary | ICD-10-CM | POA: Diagnosis not present

## 2016-08-06 DIAGNOSIS — E0800 Diabetes mellitus due to underlying condition with hyperosmolarity without nonketotic hyperglycemic-hyperosmolar coma (NKHHC): Secondary | ICD-10-CM | POA: Diagnosis not present

## 2016-08-06 DIAGNOSIS — N289 Disorder of kidney and ureter, unspecified: Secondary | ICD-10-CM | POA: Diagnosis not present

## 2016-08-10 DIAGNOSIS — N189 Chronic kidney disease, unspecified: Secondary | ICD-10-CM | POA: Diagnosis not present

## 2016-08-10 DIAGNOSIS — I1310 Hypertensive heart and chronic kidney disease without heart failure, with stage 1 through stage 4 chronic kidney disease, or unspecified chronic kidney disease: Secondary | ICD-10-CM | POA: Diagnosis not present

## 2016-08-13 ENCOUNTER — Other Ambulatory Visit: Payer: Self-pay | Admitting: Oncology

## 2016-08-13 ENCOUNTER — Telehealth: Payer: Self-pay | Admitting: Adult Health

## 2016-08-13 ENCOUNTER — Telehealth: Payer: Self-pay

## 2016-08-13 NOTE — Telephone Encounter (Signed)
sw pt to confirm r/s SCP appt 5/14 at 10 am per LOS

## 2016-08-13 NOTE — Telephone Encounter (Signed)
Pt called to r/s appt 4/26 due to death in family and will be out of town. inbasket sent

## 2016-08-14 ENCOUNTER — Ambulatory Visit: Payer: PPO | Admitting: Physical Therapy

## 2016-08-16 ENCOUNTER — Encounter: Payer: PPO | Admitting: Adult Health

## 2016-08-17 DIAGNOSIS — G4733 Obstructive sleep apnea (adult) (pediatric): Secondary | ICD-10-CM | POA: Diagnosis not present

## 2016-08-20 ENCOUNTER — Encounter: Payer: Self-pay | Admitting: Rehabilitation

## 2016-08-20 ENCOUNTER — Ambulatory Visit: Payer: PPO | Attending: General Surgery | Admitting: Rehabilitation

## 2016-08-20 DIAGNOSIS — M25611 Stiffness of right shoulder, not elsewhere classified: Secondary | ICD-10-CM | POA: Diagnosis not present

## 2016-08-20 DIAGNOSIS — M25612 Stiffness of left shoulder, not elsewhere classified: Secondary | ICD-10-CM | POA: Insufficient documentation

## 2016-08-20 DIAGNOSIS — Z9011 Acquired absence of right breast and nipple: Secondary | ICD-10-CM | POA: Diagnosis not present

## 2016-08-20 DIAGNOSIS — M25511 Pain in right shoulder: Secondary | ICD-10-CM | POA: Diagnosis not present

## 2016-08-20 DIAGNOSIS — M25512 Pain in left shoulder: Secondary | ICD-10-CM | POA: Insufficient documentation

## 2016-08-20 DIAGNOSIS — D0512 Intraductal carcinoma in situ of left breast: Secondary | ICD-10-CM | POA: Diagnosis not present

## 2016-08-20 DIAGNOSIS — G8929 Other chronic pain: Secondary | ICD-10-CM | POA: Diagnosis not present

## 2016-08-20 NOTE — Patient Instructions (Signed)
Scar Massage  Scar massage is done to improve the mobility of scar, decrease scar tissue from building up, reduce adhesions, and prevent Keloids from forming. Start scar massage after scabs have fallen off by themselves and no open areas. The first few weeks after surgery, it is normal for a scar to appear pink or red and slightly raised. Scars can itch or have areas of numbness. Some scars may be sensitive.   Direct Scar massage: after scar is healed, no opening, no scab 1.  Place pads of two fingers together directly on the scar starting at one end of the scar. Move the fingers up and down across the scar holding 5 seconds one direction.  Then go opposite direction hold 5 seconds.  2. Move over to the next section of the scar and repeat.  Work your way along the entire length of the scar.   3. Next make diagonal movements along the scar holding 5 seconds at one direction. 4. Next movement is side to side. 5. Do not rub fingers over the scar.  Instead keep firm pressure and move scar over the tissue it is on top   Scar Lift and Roll 12 weeks after surgery. 1. Pinch a small amount of the scar between your first two fingers and thumb.  2. Roll the scar between your fingers for 5 to 15 seconds. 3. Move along the scar and repeat until you have massaged the entire length of scar.   Stop the massage and call your doctor if you notice: 1. Increased redness 2. Bleeding from scar 3. Seepage coming from the scar 4. Scar is warmer and has increased pain    

## 2016-08-20 NOTE — Therapy (Signed)
[place], [place], [address] Phone: [phone]   Fax:  [phone]  Physical Therapy Evaluation  Patient Details  Name: Maureen [name] MRN: [id number] Date of Birth: 1951/02/15 Referring Provider: toth  Encounter Date: 08/20/2016      PT End of Session - 08/20/16 1159    Visit Number 1   Date for PT Re-Evaluation 10/15/16   PT Start Time 1110   PT Stop Time 1148   PT Time Calculation (min) 38 min   Activity Tolerance Patient tolerated treatment well      Past Medical History:  Diagnosis Date  . Anemia   . Arthritis   . Atrial fibrillation ([place])   . Atrial flutter ([place])   . Breast cancer of upper-outer quadrant of left female breast ([place]) 01/06/2016  . Bronchitis    with a cold  . CHF (congestive heart failure) ([place])   . Chronic kidney disease   . Diabetes ([place])   . Edema   . Family history of adverse reaction to anesthesia    " my father was hard to put under."  . Fatty liver   . Fibromyalgia   . GERD (gastroesophageal reflux disease)   . Hepatitis A   . History of blood transfusion reaction   . Hypertension   . Hypothyroidism   . IBS (irritable bowel syndrome)   . Insomnia   . Osteoarthritis   . Sleep apnea   . Vitamin B deficiency   . Wears glasses     Past Surgical History:  Procedure Laterality Date  . ABDOMINAL HYSTERECTOMY    . BREAST SURGERY     left breast biopsy  . CARDIAC CATHETERIZATION     05/07/14 Cardiac cath: Normal coronaries, NL LVF, EF 65% ([facility])  . CARPAL TUNNEL RELEASE    . CESAREAN SECTION    . CHOLECYSTECTOMY    . DILATION AND CURETTAGE OF UTERUS    . EVACUATION BREAST HEMATOMA Left 04/02/2016   Procedure: EVACUATION HEMATOMA LEFT BREAST;  Surgeon: [name] III, MD;  Location: [facility];  Service: General;  Laterality: Left;  . EYE SURGERY     strabismus and B/L cataracts  . HERNIA REPAIR    . MASTECTOMY W/ SENTINEL NODE BIOPSY  Bilateral 03/21/2016   Procedure: LEFT MASTECTOMY WITH LEFT SENTINEL LYMPH NODE BIOPSY, RIGHT PROPHYLACTIC MASTECTOMY;  Surgeon: [name] III, MD;  Location: [place];  Service: General;  Laterality: Bilateral;  . TONSILLECTOMY    . TRANSTHORACIC ECHOCARDIOGRAM     05/06/14 TTE [facility]): Mild concentric LVH, LVEF prob low normal (diff to eval due to rapid AF), mild MR.    There were no vitals filed for this visit.       Subjective Assessment - 08/20/16 1111    Subjective Pt presents with L shoulder pain and decreased ROM (some in the R) since 2015 due to OA.  Had a bilateral mastectomy 03/21/2016 and a following surgery on the L due to a hematoma.  Reports alot of firm scar tissue on the L compared to the R.     Patient Stated Goals improve reaching, ROM and pain   Currently in Pain? Yes   Pain Score 5    Pain Location Shoulder   Pain Orientation Left   Pain Descriptors / Indicators Aching;Burning;Constant   Pain Type Surgical pain;Chronic pain   Pain Onset More than a month ago   Pain Frequency Constant  Aggravating Factors  reaching, lifting   Pain Relieving Factors tramadol as needed   Effect of Pain on Daily Activities limited reaching, doing hair            [facility] PT Assessment - 08/20/16 0001      Assessment   Medical Diagnosis mastectomy   Referring Provider toth   Hand Dominance Right   Next MD Visit no   Prior Therapy no     Precautions   Precautions None     Restrictions   Weight Bearing Restrictions No     Balance Screen   Has the patient fallen in the past 6 months Yes   How many times? [place]     Prior Function   Level of Independence Independent with basic ADLs   Vocation Retired     Observation/Other Assessments   Focus on Therapeutic Outcomes (FOTO)  45% limited     Posture/Postural Control   Posture/Postural Control Postural limitations    Postural Limitations Rounded Shoulders;Forward head     ROM / Strength   AROM / PROM / Strength AROM;PROM;Strength     AROM   AROM Assessment Site Shoulder   Right/Left Shoulder Right;Left   Left Shoulder Flexion 110 Degrees   Left Shoulder ABduction 105 Degrees   Left Shoulder Internal Rotation --  to T10   Left Shoulder External Rotation --  50% behind head   Left Shoulder Horizontal ABduction 40 Degrees  pull   Left Shoulder Horizontal ADduction 100 Degrees  pull     PROM   PROM Assessment Site Shoulder   Right/Left Shoulder Right;Left   Left Shoulder Flexion 140 Degrees  pn   Left Shoulder ABduction 110 Degrees  pn   Left Shoulder Internal Rotation 55 Degrees   Left Shoulder External Rotation 50 Degrees  pain     Strength   Strength Assessment Site Shoulder   Right/Left Shoulder Right;Left   Left Shoulder Flexion 4/5   Left Shoulder Extension 4/5   Left Shoulder ABduction 4/5   Left Shoulder Internal Rotation 4/5   Left Shoulder External Rotation 4-/5     Palpation   Palpation comment 2 ttp L anterior and lateral shoulder soft tissue and tendon insertions                           PT Education - 08/20/16 1158    Education provided Yes   Education Details scar massage, diagnosis, POC, HEP   Person(s) Educated Patient;Spouse   Methods Explanation;Demonstration;Handout   Comprehension Verbalized understanding;Returned demonstration;Verbal cues required;Tactile cues required;Need further instruction             PT Long Term Goals - 08/20/16 1210      PT LONG TERM GOAL #1   Title Pt will improve L shoulder flexion to 125 or greater   Time 8   Period Weeks   Status New     PT LONG TERM GOAL #2   Title Pt will report improved ability to complete hair with the L UE   Time 8   Period Weeks   Status New     PT LONG TERM GOAL #3   Title Pt will be independent with HEP for continued strength and mobility of the shoulders   Time 8    Period Weeks   Status New  Plan - 08/20/16 1204    Clinical Impression Statement Pt presents with L shoulder pain and stiffness (complaints of R intermittently).  Pt had a bad shoulder (needing replacement) prior to a double mastectomy in November of last year which has lead to a further decrease in range of motion and more pain.  Patient does report increased scar tissue on the L versus the R.  Discussed self scar mobilization today.  Pt  would like to attempt to improve ROM and mobility of the shoulders with an understanding that the OA status at the shoulder may be limiting.     Rehab Potential Good   PT Frequency 2x / week   PT Duration 8 weeks   PT Treatment/Interventions Electrical Stimulation;Moist Heat;Therapeutic exercise;Neuromuscular re-education;Patient/family education;Manual techniques;Scar mobilization;Passive range of motion   PT Next Visit Plan shoulder ROM, postural and shoulder TE, modalities as needed (no [place] due to recent malignancy in the area)   PT Home Exercise Plan given shoulder ROM clinic handout on 4/30   Consulted and Agree with Plan of Care Patient      Patient will benefit from skilled therapeutic intervention in order to improve the following deficits and impairments:  Decreased mobility, Impaired UE functional use, Pain  Visit Diagnosis: Chronic left shoulder pain - Plan: PT plan of care cert/re-cert  Chronic right shoulder pain - Plan: PT plan of care cert/re-cert  Stiffness of left shoulder, not elsewhere classified - Plan: PT plan of care cert/re-cert  Stiffness of right shoulder, not elsewhere classified - Plan: PT plan of care cert/re-cert      G-Codes - 93/57/01 06-Aug-1216    Functional Assessment Tool Used (Outpatient Only) FOTO   Functional Limitation Carrying, moving and handling objects   Carrying, Moving and Handling Objects Current Status (X7939) At least 40 percent but less than 60 percent impaired, limited or restricted    Carrying, Moving and Handling Objects Goal Status (Q3009) At least 20 percent but less than 40 percent impaired, limited or restricted       Problem List Patient Active Problem List   Diagnosis Date Noted  . Hematoma (nontraumatic) of breast 04/02/2016  . Anemia 04/02/2016  . Acute blood loss anemia   . Hypotension   . Encounter for central line placement   . 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 Breast cancer of upper-outer quadrant of left female breast ([place]) 01/06/2016    Stark Bray, DPT, CMP 08/20/2016, 12:20 PM  [place], [place], [address] Phone: [phone]   Fax:  [phone]  Name: Maureen [name] MRN: [id number] Date of Birth: [birth year]

## 2016-08-22 ENCOUNTER — Ambulatory Visit: Payer: PPO | Attending: General Surgery | Admitting: Physical Therapy

## 2016-08-22 DIAGNOSIS — G8929 Other chronic pain: Secondary | ICD-10-CM | POA: Insufficient documentation

## 2016-08-22 DIAGNOSIS — M25512 Pain in left shoulder: Secondary | ICD-10-CM | POA: Insufficient documentation

## 2016-08-22 DIAGNOSIS — M25511 Pain in right shoulder: Secondary | ICD-10-CM | POA: Diagnosis not present

## 2016-08-22 DIAGNOSIS — M25611 Stiffness of right shoulder, not elsewhere classified: Secondary | ICD-10-CM | POA: Insufficient documentation

## 2016-08-22 DIAGNOSIS — R293 Abnormal posture: Secondary | ICD-10-CM | POA: Insufficient documentation

## 2016-08-22 DIAGNOSIS — M25612 Stiffness of left shoulder, not elsewhere classified: Secondary | ICD-10-CM | POA: Diagnosis not present

## 2016-08-22 NOTE — Therapy (Signed)
[place], [place], [address] Phone: [phone]   Fax:  [phone]  Physical Therapy Treatment  Patient Details  Name: Maureen [name] MRN: [id number] Date of Birth: March 15, 1951 Referring Provider: toth  Encounter Date: 08/22/2016      PT End of Session - 08/22/16 1559    Visit Number 2   Date for PT Re-Evaluation 10/15/16   PT Start Time 1319   PT Stop Time 1400   PT Time Calculation (min) 41 min   Activity Tolerance Patient tolerated treatment well      Past Medical History:  Diagnosis Date  . Anemia   . Arthritis   . Atrial fibrillation ([place])   . Atrial flutter ([place])   . Breast cancer of upper-outer quadrant of left female breast ([place]) 01/06/2016  . Bronchitis    with a cold  . CHF (congestive heart failure) ([place])   . Chronic kidney disease   . Diabetes ([place])   . Edema   . Family history of adverse reaction to anesthesia    " my father was hard to put under."  . Fatty liver   . Fibromyalgia   . GERD (gastroesophageal reflux disease)   . Hepatitis A   . History of blood transfusion reaction   . Hypertension   . Hypothyroidism   . IBS (irritable bowel syndrome)   . Insomnia   . Osteoarthritis   . Sleep apnea   . Vitamin B deficiency   . Wears glasses     Past Surgical History:  Procedure Laterality Date  . ABDOMINAL HYSTERECTOMY    . BREAST SURGERY     left breast biopsy  . CARDIAC CATHETERIZATION     05/07/14 Cardiac cath: Normal coronaries, NL LVF, EF 65% ([facility])  . CARPAL TUNNEL RELEASE    . CESAREAN SECTION    . CHOLECYSTECTOMY    . DILATION AND CURETTAGE OF UTERUS    . EVACUATION BREAST HEMATOMA Left 04/02/2016   Procedure: EVACUATION HEMATOMA LEFT BREAST;  Surgeon: [name] III, MD;  [facility];  Service: General;  Laterality: Left;  . EYE SURGERY     strabismus and B/L cataracts  . HERNIA REPAIR    . MASTECTOMY W/ SENTINEL NODE BIOPSY  Bilateral 03/21/2016   Procedure: LEFT MASTECTOMY WITH LEFT SENTINEL LYMPH NODE BIOPSY, RIGHT PROPHYLACTIC MASTECTOMY;  Surgeon: [name] III, MD;  Location: [place] [facility];  Service: General;  Laterality: Bilateral;  . TONSILLECTOMY    . TRANSTHORACIC ECHOCARDIOGRAM     05/06/14 TTE [facility]): Mild concentric LVH, LVEF prob low normal (diff to eval due to rapid AF), mild MR.    There were no vitals filed for this visit.      Subjective Assessment - 08/22/16 1319    Subjective Pt. feels that stretching issued last tx for the shoulder have helped her.     Patient Stated Goals improve reaching, ROM and pain   Currently in Pain? Yes   Pain Score 2    Pain Orientation Left   Pain Descriptors / Indicators Aching   Pain Type Surgical pain   Pain Onset More than a month ago   Pain Frequency Intermittent   Aggravating Factors  reaching, lifting    Multiple Pain Sites No                         [facility] Adult PT Treatment/Exercise - 08/22/16  1334      Self-Care   Self-Care Other Self-Care Comments   Other Self-Care Comments  Discussion and review of proper technique with scar tissue massage; discussion of duration and appropriate pressure with self-demo by therapist and demo on pt.      Shoulder Exercises: Seated   External Rotation AAROM;Left;Right;15 reps   External Rotation Limitations wand; towel roll      Shoulder Exercises: Standing   Horizontal ABduction AROM;10 reps;Both;Theraband  in low positioning to tolerance    Theraband Level (Shoulder Horizontal ABduction) Level 1 (Yellow)   Horizontal ABduction Limitations leaning on 6" bolster at wall    External Rotation AROM;10 reps;Both;Theraband   Theraband Level (Shoulder External Rotation) Level 1 ([place]ellow)   External Rotation Limitations leaning on 6" bolster at wall    Internal Rotation AROM;Strengthening;Right;Left;15 reps;Theraband   Theraband Level (Shoulder Internal Rotation) Level 2 (Red)   Internal  Rotation Limitations towel roll    Flexion Both;Strengthening;AAROM   Flexion Limitations wand; standing    ABduction AAROM;15 reps   ABduction Limitations wand; scaption; cues required to avoid painful arc      Shoulder Exercises: ROM/Strengthening   Other ROM/Strengthening Exercises B single arm wall ball roll into shoulder flexion, scaption x 10 reps each   gentle stretch at top avoiding painful arm     Manual Therapy   Manual Therapy Soft tissue mobilization   Soft tissue mobilization STM/strumming to L anterior/medial forearm over area of greatest tightness in horizontal abduction/ER position                     PT Long Term Goals - 08/20/16 1210      PT LONG TERM GOAL #1   Title Pt will improve L shoulder flexion to 125 or greater   Time 8   Period Weeks   Status New     PT LONG TERM GOAL #2   Title Pt will report improved ability to complete hair with the L UE   Time 8   Period Weeks   Status New     PT LONG TERM GOAL #3   Title Pt will be independent with HEP for continued strength and mobility of the shoulders   Time 8   Period Weeks   Status New               Plan - 08/22/16 1607    Clinical Impression Statement Pt. doing well today and feels that stretching HEP has, "loosened shoulder".  Treatment focusing on gentle postural strengthening, scar tissue massage review, and STM to areas of tightness in L UE.  Pt. tolerated all activities well however did require cueing to avoid painful arc with shoulder therex.  STM/strumming to L forearm due to pt. report of tightness in this area when reaching back.  Pt. will continue to benefit from further skilled therapy to maximize UE functional ability with less pain.     PT Treatment/Interventions Electrical Stimulation;Moist Heat;Therapeutic exercise;Neuromuscular re-education;Patient/family education;Manual techniques;Scar mobilization;Passive range of motion   PT Next Visit Plan shoulder ROM, postural  and shoulder TE, modalities as needed (no [place] due to recent malignancy in the area)      Patient will benefit from skilled therapeutic intervention in order to improve the following deficits and impairments:  Decreased mobility, Impaired UE functional use, Pain  Visit Diagnosis: Chronic left shoulder pain  Chronic right shoulder pain  Stiffness of left shoulder, not elsewhere classified  Stiffness of right shoulder, not elsewhere classified  Problem List Patient Active Problem List   Diagnosis Date Noted  . Hematoma (nontraumatic) of breast 04/02/2016  . Anemia 04/02/2016  . Acute blood loss anemia   . Hypotension   . Encounter for central line placement   . 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 Breast cancer of upper-outer quadrant of left female breast ([place]) 01/06/2016    Bess Harvest, PTA 08/22/16 4:14 PM  [place], [place], [address] Phone: [phone]   Fax:  [phone]  Name: Maureen [name] MRN: [id number] Date of Birth: 1951/03/22

## 2016-08-28 ENCOUNTER — Encounter: Payer: Self-pay | Admitting: Physical Therapy

## 2016-08-28 ENCOUNTER — Ambulatory Visit: Payer: PPO | Admitting: Physical Therapy

## 2016-08-28 DIAGNOSIS — M25611 Stiffness of right shoulder, not elsewhere classified: Secondary | ICD-10-CM

## 2016-08-28 DIAGNOSIS — G8929 Other chronic pain: Secondary | ICD-10-CM

## 2016-08-28 DIAGNOSIS — M25511 Pain in right shoulder: Secondary | ICD-10-CM

## 2016-08-28 DIAGNOSIS — M25512 Pain in left shoulder: Principal | ICD-10-CM

## 2016-08-28 DIAGNOSIS — M25612 Stiffness of left shoulder, not elsewhere classified: Secondary | ICD-10-CM

## 2016-08-28 NOTE — Therapy (Signed)
Maureen [name], [place], [address] Phone: [phone]   Fax:  [phone]  Physical Therapy Treatment  Patient Details  Name: Maureen [name] MRN: [id number] Date of Birth: 04-13-1951 Referring Provider: toth  Encounter Date: 08/28/2016      PT End of Session - 08/28/16 1354    Visit Number 3   Date for PT Re-Evaluation 10/15/16   PT Start Time 1315   PT Stop Time 1405   PT Time Calculation (min) 50 min      Past Medical History:  Diagnosis Date  . Anemia   . Arthritis   . Atrial fibrillation ([place])   . Atrial flutter ([place])   . Breast cancer of upper-outer quadrant of left female breast ([place]) 01/06/2016  . Bronchitis    with a cold  . CHF (congestive heart failure) ([place])   . Chronic kidney disease   . Diabetes ([place])   . Edema   . Family history of adverse reaction to anesthesia    " my father was hard to put under."  . Fatty liver   . Fibromyalgia   . GERD (gastroesophageal reflux disease)   . Hepatitis A   . History of blood transfusion reaction   . Hypertension   . Hypothyroidism   . IBS (irritable bowel syndrome)   . Insomnia   . Osteoarthritis   . Sleep apnea   . Vitamin B deficiency   . Wears glasses     Past Surgical History:  Procedure Laterality Date  . ABDOMINAL HYSTERECTOMY    . BREAST SURGERY     left breast biopsy  . CARDIAC CATHETERIZATION     05/07/14 Cardiac cath: Normal coronaries, NL LVF, EF 65% ([facility])  . CARPAL TUNNEL RELEASE    . CESAREAN SECTION    . CHOLECYSTECTOMY    . DILATION AND CURETTAGE OF UTERUS    . EVACUATION BREAST HEMATOMA Left 04/02/2016   Procedure: EVACUATION HEMATOMA LEFT BREAST;  Surgeon: [name] III, MD;  [facility];  Service: General;  Laterality: Left;  . EYE SURGERY     strabismus and B/L cataracts  . HERNIA REPAIR    . MASTECTOMY W/ SENTINEL NODE BIOPSY Bilateral 03/21/2016   Procedure: LEFT MASTECTOMY WITH  LEFT SENTINEL LYMPH NODE BIOPSY, RIGHT PROPHYLACTIC MASTECTOMY;  Surgeon: [name] III, MD;  Location: [facility];  Service: General;  Laterality: Bilateral;  . TONSILLECTOMY    . TRANSTHORACIC ECHOCARDIOGRAM     05/06/14 TTE [facility]): Mild concentric LVH, LVEF prob low normal (diff to eval due to rapid AF), mild MR.    There were no vitals filed for this visit.      Subjective Assessment - 08/28/16 1312    Subjective doing okay   Currently in Pain? Yes   Pain Score 2    Pain Location Shoulder   Pain Orientation Left                         [facility] Adult PT Treatment/Exercise - 08/28/16 0001      Shoulder Exercises: Supine   Flexion Both;10 reps;Weights   Shoulder Flexion Weight (lbs) 2   Other Supine Exercises chest press 2# 1 times   Other Supine Exercises 2# rhy stab     Shoulder Exercises: Standing   Other Standing Exercises finger ladder flex and abd 5 times each   Other Standing Exercises rolling ball up  wall 10 times     Shoulder Exercises: ROM/Strengthening   UBE (Upper Arm Bike) L 2 2 fwd/2 back   Cybex Row 20 reps  20#   Other ROM/Strengthening Exercises lat pull 20# 2 sets 10     Modalities   Modalities Cryotherapy     Cryotherapy   Number Minutes Cryotherapy 15 Minutes   Type of Cryotherapy Ice pack     Manual Therapy   Manual Therapy Soft tissue mobilization;Passive ROM;Joint mobilization  Left shld                     PT Long Term Goals - 08/20/16 1210      PT LONG TERM GOAL #1   Title Pt will improve L shoulder flexion to 125 or greater   Time 8   Period Weeks   Status New     PT LONG TERM GOAL #2   Title Pt will report improved ability to complete hair with the L UE   Time 8   Period Weeks   Status New     PT LONG TERM GOAL #3   Title Pt will be independent with HEP for continued strength and mobility of the shoulders   Time 8   Period Weeks   Status New               Plan - 08/28/16 1354     Clinical Impression Statement tightness and pain with MT but looser at end. fatigued with ex but did well   PT Next Visit Plan shoulder ROM, postural and shoulder TE, modalities as needed (no [place] due to recent malignancy in the area)      Patient will benefit from skilled therapeutic intervention in order to improve the following deficits and impairments:  Decreased mobility, Impaired UE functional use, Pain  Visit Diagnosis: Chronic left shoulder pain  Stiffness of left shoulder, not elsewhere classified  Stiffness of right shoulder, not elsewhere classified  Chronic right shoulder pain     Problem List Patient Active Problem List   Diagnosis Date Noted  . Hematoma (nontraumatic) of breast 04/02/2016  . Anemia 04/02/2016  . Acute blood loss anemia   . Hypotension   . Encounter for central line placement   . 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 Breast cancer of upper-outer quadrant of left female breast ([place]) 01/06/2016    Maureen [name],ANGIE PTA 08/28/2016, 1:57 PM  [place], [place], [address] Phone: [phone]   Fax:  [phone]  Name: Maureen [name] MRN: [id number] Date of Birth: 04-07-1951

## 2016-08-30 ENCOUNTER — Ambulatory Visit: Payer: PPO | Admitting: Physical Therapy

## 2016-08-30 ENCOUNTER — Encounter: Payer: Self-pay | Admitting: Physical Therapy

## 2016-08-30 DIAGNOSIS — G8929 Other chronic pain: Secondary | ICD-10-CM

## 2016-08-30 DIAGNOSIS — M25512 Pain in left shoulder: Principal | ICD-10-CM

## 2016-08-30 DIAGNOSIS — M25511 Pain in right shoulder: Secondary | ICD-10-CM

## 2016-08-30 DIAGNOSIS — M25612 Stiffness of left shoulder, not elsewhere classified: Secondary | ICD-10-CM

## 2016-08-30 NOTE — Therapy (Signed)
Maureen [name], [place], [address] Phone: [phone]   Fax:  [phone]  Physical Therapy Treatment  Patient Details  Name: Maureen [name] MRN: [id number] Date of Birth: [birth year] Referring Provider: toth  Encounter Date: 08/30/2016      PT End of Session - 08/30/16 1430    Visit Number 4   Date for PT Re-Evaluation 10/15/16   PT Start Time 1400   PT Stop Time 1455   PT Time Calculation (min) 55 min      Past Medical History:  Diagnosis Date  . Anemia   . Arthritis   . Atrial fibrillation ([place])   . Atrial flutter ([place])   . Breast cancer of upper-outer quadrant of left female breast ([place]) 01/06/2016  . Bronchitis    with a cold  . CHF (congestive heart failure) ([place])   . Chronic kidney disease   . Diabetes ([place])   . Edema   . Family history of adverse reaction to anesthesia    " my father was hard to put under."  . Fatty liver   . Fibromyalgia   . GERD (gastroesophageal reflux disease)   . Hepatitis A   . History of blood transfusion reaction   . Hypertension   . Hypothyroidism   . IBS (irritable bowel syndrome)   . Insomnia   . Osteoarthritis   . Sleep apnea   . Vitamin B deficiency   . Wears glasses     Past Surgical History:  Procedure Laterality Date  . ABDOMINAL HYSTERECTOMY    . BREAST SURGERY     left breast biopsy  . CARDIAC CATHETERIZATION     05/07/14 Cardiac cath: Normal coronaries, NL LVF, EF 65% ([facility])  . CARPAL TUNNEL RELEASE    . CESAREAN SECTION    . CHOLECYSTECTOMY    . DILATION AND CURETTAGE OF UTERUS    . EVACUATION BREAST HEMATOMA Left 04/02/2016   Procedure: EVACUATION HEMATOMA LEFT BREAST;  Surgeon: [name] III, MD;  [facility]: [facility];  Service: General;  Laterality: Left;  . EYE SURGERY     strabismus and B/L cataracts  . HERNIA REPAIR    . MASTECTOMY W/ SENTINEL NODE BIOPSY Bilateral 03/21/2016   Procedure: LEFT MASTECTOMY WITH  LEFT SENTINEL LYMPH NODE BIOPSY, RIGHT PROPHYLACTIC MASTECTOMY;  Surgeon: [name] III, MD;  Location: [facility];  Service: General;  Laterality: Bilateral;  . TONSILLECTOMY    . TRANSTHORACIC ECHOCARDIOGRAM     05/06/14 TTE [facility]): Mild concentric LVH, LVEF prob low normal (diff to eval due to rapid AF), mild MR.    There were no vitals filed for this visit.      Subjective Assessment - 08/30/16 1404    Subjective increased func and motion at home   Currently in Pain? Yes   Pain Score 1    Pain Location Shoulder   Pain Orientation Left            [facility] PT Assessment - 08/30/16 0001      AROM   Left Shoulder Flexion 124 Degrees  standing   Left Shoulder ABduction 125 Degrees  standing                     [facility] Adult PT Treatment/Exercise - 08/30/16 0001      Shoulder Exercises: Seated   Other Seated Exercises 3# cane ex flex,horz abd and chest press 2 sets 10  Shoulder Exercises: Standing   Horizontal ABduction Strengthening;Both;15 reps;Theraband   Theraband Level (Shoulder Horizontal ABduction) Level 2 (Red)   External Rotation Strengthening;Both;15 reps;Theraband   Theraband Level (Shoulder External Rotation) Level 2 (Red)   Flexion Strengthening;Left;10 reps  2# in cabinet   ABduction Strengthening;Left;10 reps;Weights  in cabinet   Shoulder ABduction Weight (lbs) 2   Extension Both;Strengthening;15 reps;Theraband   Theraband Level (Shoulder Extension) Level 2 (Red)   Row Strengthening;Both;15 reps;Theraband   Theraband Level (Shoulder Row) Level 2 (Red)   Other Standing Exercises finger ladder flex and abd 5 times each  2# Left only   Other Standing Exercises 2# towel on wall 10 each directiom  cane ex 3# ext and IR 2 sets 10     Shoulder Exercises: ROM/Strengthening   UBE (Upper Arm Bike) L 3 2 fwd/2 back     Cryotherapy   Number Minutes Cryotherapy 15 Minutes   Type of Cryotherapy Ice pack     Manual Therapy   Manual  Therapy Soft tissue mobilization;Passive ROM;Joint mobilization  left shld                     PT Long Term Goals - 08/30/16 1429      PT LONG TERM GOAL #1   Title Pt will improve L shoulder flexion to 125 or greater   Baseline 124   Status Partially Met     PT LONG TERM GOAL #2   Title Pt will report improved ability to complete hair with the L UE   Status On-going     PT LONG TERM GOAL #3   Title Pt will be independent with HEP for continued strength and mobility of the shoulders   Status On-going               Plan - 08/30/16 1430    Clinical Impression Statement improved ROM, pain and fatigue with ex requiring freq rest breaks. progressing wih goals   PT Next Visit Plan shld strength/ROM and endurance      Patient will benefit from skilled therapeutic intervention in order to improve the following deficits and impairments:  Decreased mobility, Impaired UE functional use, Pain  Visit Diagnosis: Chronic left shoulder pain  Stiffness of left shoulder, not elsewhere classified  Chronic right shoulder pain     Problem List Patient Active Problem List   Diagnosis Date Noted  . Hematoma (nontraumatic) of breast 04/02/2016  . Anemia 04/02/2016  . Acute blood loss anemia   . Hypotension   . Encounter for central line placement   . 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 Breast cancer of upper-outer quadrant of left female breast ([place]) 01/06/2016    Keo Schirmer,ANGIE PTA 08/30/2016, 2:43 PM  [address] [place], [place], [address] Phone: [phone]   Fax:  [phone]  Name: Maureen [name] MRN: [id number] Date of Birth: 1950-05-15

## 2016-09-03 ENCOUNTER — Ambulatory Visit (HOSPITAL_BASED_OUTPATIENT_CLINIC_OR_DEPARTMENT_OTHER): Payer: PPO | Admitting: Adult Health

## 2016-09-03 VITALS — BP 148/45 | HR 72 | Temp 98.7°F | Resp 18 | Ht 64.0 in

## 2016-09-03 DIAGNOSIS — Z170 Estrogen receptor positive status [ER+]: Secondary | ICD-10-CM

## 2016-09-03 DIAGNOSIS — C50412 Malignant neoplasm of upper-outer quadrant of left female breast: Secondary | ICD-10-CM

## 2016-09-03 NOTE — Progress Notes (Signed)
CLINIC:  Survivorship   REASON FOR VISIT:  Routine follow-up post-treatment for a recent history of breast cancer.  BRIEF ONCOLOGIC HISTORY:    Breast cancer of upper-outer quadrant of left female breast ([place])   01/04/2016 Initial Diagnosis    Left breast biopsy:grade 3 DCIS with necrosis and calcifications, ER +, PR 100%, mammogram revealed multiple clusters of calcifications measuring 6.4 cm span      03/21/2016 Surgery    Bilateral Mastectomy [facility] Starks); Left: HG DCIS with necrosis 0/1 LN; : Benign; ER/PR 100%       INTERVAL HISTORY:  Maureen [name] presents to the [facility] today for our initial meeting to review her survivorship care plan detailing her treatment course for breast cancer, as well as monitoring long-term side effects of that treatment, education regarding health maintenance, screening, and overall wellness and health promotion.     Overall, Maureen [name] reports feeling quite well since surgery.  She denies any current breast or range of motion concerns.      REVIEW OF SYSTEMS:  Review of Systems  Constitutional: Negative for appetite change, chills, diaphoresis, fatigue, fever and unexpected weight change.  HENT:   Negative for hearing loss and lump/mass.   Eyes: Negative for eye problems and icterus.  Respiratory: Negative for chest tightness, cough and shortness of breath.   Cardiovascular: Negative for chest pain, leg swelling and palpitations.  Gastrointestinal: Negative for abdominal distention and abdominal pain.  Endocrine: Negative for hot flashes.  Genitourinary: Negative for difficulty urinating and dyspareunia.   Musculoskeletal: Negative for arthralgias and back pain.  Skin: Negative for itching.  Neurological: Negative for dizziness, extremity weakness and headaches.  Hematological: Negative for adenopathy. Does not bruise/bleed easily.  Psychiatric/Behavioral: Negative for depression and sleep disturbance. The patient is not nervous/anxious.    Breast: Denies any swelling or breast concerns.    ONCOLOGY TREATMENT TEAM:  1. Surgeon:  Dr. Marlou Starks at [facility] 2. Medical Oncologist: Dr. Lindi Adie 3. Radiation Oncologist: Dr. Sondra Come    PAST MEDICAL/SURGICAL HISTORY:  Past Medical History:  Diagnosis Date  . Anemia   . Arthritis   . Atrial fibrillation ([place])   . Atrial flutter ([place])   . Breast cancer of upper-outer quadrant of left female breast ([place]) 01/06/2016  . Bronchitis    with a cold  . CHF (congestive heart failure) ([place])   . Chronic kidney disease   . Diabetes ([place])   . Edema   . Family history of adverse reaction to anesthesia    " my father was hard to put under."  . Fatty liver   . Fibromyalgia   . GERD (gastroesophageal reflux disease)   . Hepatitis A   . History of blood transfusion reaction   . Hypertension   . Hypothyroidism   . IBS (irritable bowel syndrome)   . Insomnia   . Osteoarthritis   . Sleep apnea   . Vitamin B deficiency   . Wears glasses    Past Surgical History:  Procedure Laterality Date  . ABDOMINAL HYSTERECTOMY    . BREAST SURGERY     left breast biopsy  . CARDIAC CATHETERIZATION     05/07/14 Cardiac cath: Normal coronaries, NL LVF, EF 65% ([facility])  . CARPAL TUNNEL RELEASE    . CESAREAN SECTION    . CHOLECYSTECTOMY    . DILATION AND CURETTAGE OF UTERUS    . EVACUATION BREAST HEMATOMA Left 04/02/2016   Procedure: EVACUATION HEMATOMA LEFT BREAST;  Surgeon: [name] III,  MD;  [facility];  Service: General;  Laterality: Left;  . EYE SURGERY     strabismus and B/L cataracts  . HERNIA REPAIR    . MASTECTOMY W/ SENTINEL NODE BIOPSY Bilateral 03/21/2016   Procedure: LEFT MASTECTOMY WITH LEFT SENTINEL LYMPH NODE BIOPSY, RIGHT PROPHYLACTIC MASTECTOMY;  Surgeon: [name] III, MD;  Location: [facility];  Service: General;  Laterality: Bilateral;  . TONSILLECTOMY    . TRANSTHORACIC ECHOCARDIOGRAM     05/06/14 TTE [facility]): Mild concentric LVH, LVEF  prob low normal (diff to eval due to rapid AF), mild MR.     ALLERGIES:  Allergies  Allergen Reactions  . Cephalexin Hives  . Codeine Other (See Comments)    Heartburn   . Doxycycline Itching  . Hydrocodone Itching  . Penicillin G Other (See Comments)    PATIENT DOESN'T REMEMBER [DOES HAVE ALLERGIC RESPONSE TO CEPHALEXIN > HIVES]  Has patient had a PCN reaction causing immediate rash, facial/tongue/throat swelling, SOB or lightheadedness with hypotension: unknown Has patient had a PCN reaction causing severe rash involving mucus membranes or skin necrosis: unknown Has patient had a PCN reaction that required hospitalization: unknown Has patient had a PCN reaction occurring within the last 10 years: unknown If all of the above answers are "NO", th  . Percodan [Oxycodone-Aspirin] Itching     CURRENT MEDICATIONS:  Outpatient Encounter Prescriptions as of 09/03/2016  Medication Sig  . amiodarone (PACERONE) 200 MG tablet TAKE 1 TABLET ONCE A DAY  . amitriptyline (ELAVIL) 50 MG tablet TAKE 1 TABLET BY MOUTH AT BEDTIME  . apixaban (ELIQUIS) 5 MG TABS tablet TAKE 1 TABLET EVERY 12 HOURS  . carvedilol (COREG) 6.25 MG tablet TAKE 1 TABLET BY MOUTH TWICE A DAY  . clindamycin (CLEOCIN) 300 MG capsule Take 600 mg by mouth 2 (two) times daily as needed (for dental procedures).  . Cyanocobalamin 1000 MCG/ML LIQD Inject 1,000 mcg as directed every 30 days.  Marland Kitchen doxazosin (CARDURA) 4 MG tablet Take 4 mg by mouth at bedtime.   . fenofibrate 160 MG tablet TAKE 1 TABLET BY MOUTH AT BEDTIME  . furosemide (LASIX) 40 MG tablet take 40 to 80 mgs daily based on weight gain  . HYDROmorphone (DILAUDID) 2 MG tablet Take 0.5 tablets (1 mg total) by mouth every 2 (two) hours as needed for severe pain.  Marland Kitchen HYDROmorphone (DILAUDID) 4 MG tablet Take 0.5-2 tablets (2-8 mg total) by mouth every 6 (six) hours as needed for severe pain.  Marland Kitchen levothyroxine (SYNTHROID, LEVOTHROID) 100 MCG tablet Take 100 mcg by mouth at  bedtime.  . Liraglutide (VICTOZA) 18 MG/3ML SOPN Inject 0.6 mg into the skin daily.   Marland Kitchen loperamide (IMODIUM A-D) 2 MG tablet Take 4 mg by mouth 4 (four) times daily as needed for diarrhea or loose stools.  . metFORMIN (GLUCOPHAGE-XR) 500 MG 24 hr tablet TAKE 1 TABLETS BY MOUTH TWICE A DAY  . tiZANidine (ZANAFLEX) 4 MG tablet take 76ms by mouth at bedtime  . traMADol (ULTRAM) 50 MG tablet takes 1033m twice daily as needed for pain  . VITAMIN D, ERGOCALCIFEROL, PO Take 500 Units by mouth 2 (two) times daily.  . Marland Kitchenolpidem (AMBIEN) 10 MG tablet TAKE 1 TABLET BY MOUTH AT BEDTIME   No facility-administered encounter medications on file as of 09/03/2016.      ONCOLOGIC FAMILY HISTORY:  Family History  Problem Relation Age of Onset  . Breast cancer Mother   . Colon cancer Maternal Grandmother  GENETIC COUNSELING/TESTING: Not indicated  SOCIAL HISTORY:  Maureen [name] is divorced and lives with her daughter in [place], [place].  She has 3 children and they live in [place], [place] and [place].  Maureen [name] is currently retired.  She denies any current or history of tobacco, alcohol, or illicit drug use.     PHYSICAL EXAMINATION:  Vital Signs:   Vitals:   09/03/16 1540  BP: (!) 148/45  Pulse: 72  Resp: 18  Temp: 98.7 F (37.1 C)   There were no vitals filed for this visit. General: Well-nourished, well-appearing female in no acute distress.  She is unaccompanied.   HEENT: Head is normocephalic.  Pupils equal and reactive to light. Conjunctivae clear without exudate.  Sclerae anicteric. Oral mucosa is pink, moist.  Oropharynx is pink without lesions or erythema.  Lymph: No cervical, supraclavicular, or infraclavicular lymphadenopathy noted on palpation.  Cardiovascular: Regular rate and rhythm.Marland Kitchen Respiratory: Clear to auscultation bilaterally. Chest expansion symmetric; breathing non-labored.  GI: Abdomen soft and round; non-tender, non-distended. Bowel sounds  normoactive.  GU: Deferred.  Neuro: No focal deficits. Steady gait.  Psych: Mood and affect normal and appropriate for situation.  Extremities: No edema. Skin: Warm and dry.  LABORATORY DATA:  None for this visit.  DIAGNOSTIC IMAGING:  None for this visit.      ASSESSMENT AND PLAN:  Ms.. [name] is a pleasant 66 y.o. female with Stage 0 left breast DCIS, ER+/PR+/HER2-, diagnosed in 12/2015, treated with bilateral mastectomies.  She presents to the Survivorship Clinic for our initial meeting and routine follow-up post-completion of treatment for breast cancer.    1. Stage 0 left breast cancer:  Maureen [name] is continuing to recover from definitive treatment for breast cancer. Today, a comprehensive survivorship care plan and treatment summary was reviewed with the patient today detailing her breast cancer diagnosis, treatment course, potential late/long-term effects of treatment, appropriate follow-up care with recommendations for the future, and patient education resources.  A copy of this summary, along with a letter will be sent to the patient's primary care provider via mail/fax/In Basket message after today's visit.     2. Bone health:  Given Maureen [name] age/history of breast cancer, she is at risk for bone demineralization.  She has undergone a bone density in the past two years that was normal.   Calcium, vitamin d and weight bearing exercises were recommended.   She was given education on specific activities to promote bone health.  3. Cancer screening:  Due to Maureen [name]'s history and her age, she should receive screening for skin cancers, colon cancer, and gynecologic cancers.  The information and recommendations are listed on the patient's comprehensive care plan/treatment summary and were reviewed in detail with the patient.    4. Health maintenance and wellness promotion: Maureen [name] was encouraged to consume 5-7 servings of fruits and vegetables per day. We reviewed the "Nutrition  Rainbow" handout, as well as the handout "Take Control of Your Health and Reduce Your Cancer Risk" from the [facility].  She was also encouraged to engage in moderate to vigorous exercise for 30 minutes per day most days of the week. We discussed the [employer] fitness program, which is designed for cancer survivors to help them become more physically fit after cancer treatments.  She was instructed to limit her alcohol consumption and continue to abstain from tobacco use.     5. Support services/counseling: It is not uncommon for this period of the patient's cancer  care trajectory to be one of many emotions and stressors.  We discussed an opportunity for her to participate in the next session of [facility] ("Finding Your New Normal") support group series designed for patients after they have completed treatment.   Maureen [name] was encouraged to take advantage of our many other support services programs, support groups, and/or counseling in coping with her new life as a cancer survivor after completing anti-cancer treatment.  She was offered support today through active listening and expressive supportive counseling.  She was given information regarding our available services and encouraged to contact me with any questions or for help enrolling in any of our support group/programs.    Dispo:   -Return for follow up with Dr. Marlou Starks in one month as scheduled. -She is welcome to return back to the Survivorship Clinic at any time; no additional follow-up needed at this time.  -Consider referral back to survivorship as a long-term survivor for continued surveillance  A total of (30) minutes of face-to-face time was spent with this patient with greater than 50% of that time in counseling and care-coordination.   Gardenia Phlegm, [place] [phone]   Note: PRIMARY CARE PROVIDER Nicola Girt, [place] [phone]

## 2016-09-04 ENCOUNTER — Encounter: Payer: Self-pay | Admitting: Physical Therapy

## 2016-09-04 ENCOUNTER — Encounter: Payer: Self-pay | Admitting: Adult Health

## 2016-09-04 ENCOUNTER — Ambulatory Visit: Payer: PPO | Admitting: Physical Therapy

## 2016-09-04 DIAGNOSIS — M25512 Pain in left shoulder: Principal | ICD-10-CM

## 2016-09-04 DIAGNOSIS — M25612 Stiffness of left shoulder, not elsewhere classified: Secondary | ICD-10-CM

## 2016-09-04 DIAGNOSIS — G8929 Other chronic pain: Secondary | ICD-10-CM

## 2016-09-04 NOTE — Therapy (Signed)
[facility] [address] [place], [place], [address] Phone: [phone]   Fax:  [phone]  Physical Therapy Treatment  Patient Details  Name: Maureen [name] MRN: [id number] Date of Birth: 1951/01/05 Referring Provider: toth  Encounter Date: 09/04/2016      PT End of Session - 09/04/16 1258    Visit Number 5   Date for PT Re-Evaluation 10/15/16   PT Start Time 1230   PT Stop Time 1330   PT Time Calculation (min) 60 min      Past Medical History:  Diagnosis Date  . Anemia   . Arthritis   . Atrial fibrillation ([place])   . Atrial flutter ([place])   . Breast cancer of upper-outer quadrant of left female breast ([place]) 01/06/2016  . Bronchitis    with a cold  . CHF (congestive heart failure) ([place])   . Chronic kidney disease   . Diabetes ([place])   . Edema   . Family history of adverse reaction to anesthesia    " my father was hard to put under."  . Fatty liver   . Fibromyalgia   . GERD (gastroesophageal reflux disease)   . Hepatitis A   . History of blood transfusion reaction   . Hypertension   . Hypothyroidism   . IBS (irritable bowel syndrome)   . Insomnia   . Osteoarthritis   . Sleep apnea   . Vitamin B deficiency   . Wears glasses     Past Surgical History:  Procedure Laterality Date  . ABDOMINAL HYSTERECTOMY    . BREAST SURGERY     left breast biopsy  . CARDIAC CATHETERIZATION     05/07/14 Cardiac cath: Normal coronaries, NL LVF, EF 65% ([facility])  . CARPAL TUNNEL RELEASE    . CESAREAN SECTION    . CHOLECYSTECTOMY    . DILATION AND CURETTAGE OF UTERUS    . EVACUATION BREAST HEMATOMA Left 04/02/2016   Procedure: EVACUATION HEMATOMA LEFT BREAST;  Surgeon: [name] III, MD;  [facility];  Service: General;  Laterality: Left;  . EYE SURGERY     strabismus and B/L cataracts  . HERNIA REPAIR    . MASTECTOMY W/ SENTINEL NODE BIOPSY Bilateral 03/21/2016   Procedure: LEFT MASTECTOMY WITH  LEFT SENTINEL LYMPH NODE BIOPSY, RIGHT PROPHYLACTIC MASTECTOMY;  Surgeon: [name] III, MD;  Location: [facility];  Service: General;  Laterality: Bilateral;  . TONSILLECTOMY    . TRANSTHORACIC ECHOCARDIOGRAM     05/06/14 TTE [facility]): Mild concentric LVH, LVEF prob low normal (diff to eval due to rapid AF), mild MR.    There were no vitals filed for this visit.      Subjective Assessment - 09/04/16 1229    Subjective good and bad days   Currently in Pain? Yes   Pain Score 2    Pain Location Shoulder   Pain Orientation Left                         [facility] Adult PT Treatment/Exercise - 09/04/16 0001      Shoulder Exercises: Seated   Other Seated Exercises lat pull 20# 2 sets 10  chest press 10# 2 set s10     Shoulder Exercises: Standing   Flexion Strengthening;15 reps;Weights  ecc lowering   Shoulder Flexion Weight (lbs) 2   Other Standing Exercises IR/ER with pulley  IR 10# ER 5#   Other  Standing Exercises 2# towel on wall 10 each directiom  2# func reaching 3 sets 10     Shoulder Exercises: ROM/Strengthening   UBE (Upper Arm Bike) L 3 3 fwd/3 back   Cybex Row 20 reps  20#     Cryotherapy   Number Minutes Cryotherapy 15 Minutes   Type of Cryotherapy Ice pack     Manual Therapy   Manual Therapy Soft tissue mobilization;Passive ROM;Joint mobilization                     PT Long Term Goals - 08/30/16 1429      PT LONG TERM GOAL #1   Title Pt will improve L shoulder flexion to 125 or greater   Baseline 124   Status Partially Met     PT LONG TERM GOAL #2   Title Pt will report improved ability to complete hair with the L UE   Status On-going     PT LONG TERM GOAL #3   Title Pt will be independent with HEP for continued strength and mobility of the shoulders   Status On-going               Plan - 09/04/16 1258    Clinical Impression Statement increased func ROM today with ex but increased audible crepitus and pain.  difficulty relaxing with MT and very limited ER.   PT Next Visit Plan shld strength/ROM and endurance      Patient will benefit from skilled therapeutic intervention in order to improve the following deficits and impairments:  Decreased mobility, Impaired UE functional use, Pain  Visit Diagnosis: Chronic left shoulder pain  Stiffness of left shoulder, not elsewhere classified     Problem List Patient Active Problem List   Diagnosis Date Noted  . Hematoma (nontraumatic) of breast 04/02/2016  . Anemia 04/02/2016  . Acute blood loss anemia   . Hypotension   . Encounter for central line placement   . 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 Breast cancer of upper-outer quadrant of left female breast ([place]) 01/06/2016    Chandlor Noecker,ANGIE PTA 09/04/2016, 1:00 PM  [place], [place], [address] Phone: [phone]   Fax:  [phone]  Name: Maureen [name] MRN: [id number] Date of Birth: 1951/04/18

## 2016-09-05 DIAGNOSIS — I10 Essential (primary) hypertension: Secondary | ICD-10-CM | POA: Diagnosis not present

## 2016-09-05 DIAGNOSIS — N183 Chronic kidney disease, stage 3 (moderate): Secondary | ICD-10-CM | POA: Diagnosis not present

## 2016-09-05 DIAGNOSIS — I5032 Chronic diastolic (congestive) heart failure: Secondary | ICD-10-CM | POA: Diagnosis not present

## 2016-09-05 DIAGNOSIS — M1000 Idiopathic gout, unspecified site: Secondary | ICD-10-CM | POA: Diagnosis not present

## 2016-09-07 ENCOUNTER — Ambulatory Visit: Payer: PPO | Admitting: Physical Therapy

## 2016-09-07 ENCOUNTER — Encounter: Payer: Self-pay | Admitting: Physical Therapy

## 2016-09-07 DIAGNOSIS — M25512 Pain in left shoulder: Secondary | ICD-10-CM | POA: Diagnosis not present

## 2016-09-07 DIAGNOSIS — G8929 Other chronic pain: Secondary | ICD-10-CM

## 2016-09-07 DIAGNOSIS — M25612 Stiffness of left shoulder, not elsewhere classified: Secondary | ICD-10-CM

## 2016-09-07 NOTE — Therapy (Signed)
[place], [place], [address] Phone: [phone]   Fax:  [phone]  Physical Therapy Treatment  Patient Details  Name: Maureen [name] MRN: [id number] Date of Birth: 23-Mar-1951 Referring Provider: toth  Encounter Date: 09/07/2016      PT End of Session - 09/07/16 1141    Visit Number 6   Date for PT Re-Evaluation 10/15/16   PT Start Time 1100   PT Stop Time 1155   PT Time Calculation (min) 55 min      Past Medical History:  Diagnosis Date  . Anemia   . Arthritis   . Atrial fibrillation ([place])   . Atrial flutter ([place])   . Breast cancer of upper-outer quadrant of left female breast ([place]) 01/06/2016  . Bronchitis    with a cold  . CHF (congestive heart failure) ([place])   . Chronic kidney disease   . Diabetes ([place])   . Edema   . Family history of adverse reaction to anesthesia    " my father was hard to put under."  . Fatty liver   . Fibromyalgia   . GERD (gastroesophageal reflux disease)   . Hepatitis A   . History of blood transfusion reaction   . Hypertension   . Hypothyroidism   . IBS (irritable bowel syndrome)   . Insomnia   . Osteoarthritis   . Sleep apnea   . Vitamin B deficiency   . Wears glasses     Past Surgical History:  Procedure Laterality Date  . ABDOMINAL HYSTERECTOMY    . BREAST SURGERY     left breast biopsy  . CARDIAC CATHETERIZATION     05/07/14 Cardiac cath: Normal coronaries, NL LVF, EF 65% ([facility])  . CARPAL TUNNEL RELEASE    . CESAREAN SECTION    . CHOLECYSTECTOMY    . DILATION AND CURETTAGE OF UTERUS    . EVACUATION BREAST HEMATOMA Left 04/02/2016   Procedure: EVACUATION HEMATOMA LEFT BREAST;  Surgeon: [name] III, MD;  [facility]: [facility];  Service: General;  Laterality: Left;  . EYE SURGERY     strabismus and B/L cataracts  . HERNIA REPAIR    . MASTECTOMY W/ SENTINEL NODE BIOPSY Bilateral 03/21/2016   Procedure: LEFT MASTECTOMY WITH  LEFT SENTINEL LYMPH NODE BIOPSY, RIGHT PROPHYLACTIC MASTECTOMY;  Surgeon: [name] III, MD;  Location: [place];  Service: General;  Laterality: Bilateral;  . TONSILLECTOMY    . TRANSTHORACIC ECHOCARDIOGRAM     05/06/14 TTE [facility]): Mild concentric LVH, LVEF prob low normal (diff to eval due to rapid AF), mild MR.    There were no vitals filed for this visit.      Subjective Assessment - 09/07/16 1101    Subjective very painful with stretching last session but only bad that day. I do think tx is helping.   Currently in Pain? Yes   Pain Score 3    Pain Location Shoulder   Pain Orientation Left                         [facility] Adult PT Treatment/Exercise - 09/07/16 0001      Shoulder Exercises: Supine   Other Supine Exercises 2# LUE ther ex 5 ways 10 reps     Shoulder Exercises: Standing   Flexion 20 reps  wt ball   Extension Strengthening;Both;20 reps  10# pulley   Row Strengthening;Both;20 reps  pulley  10#   Other Standing Exercises finger ladder flex and bad 10 times 2# ecc lowering   Other Standing Exercises 2# cabinet reaching flex and abd 10 each     Shoulder Exercises: ROM/Strengthening   UBE (Upper Arm Bike) Nustep standing left arm 3 min push/pull and 3 min horz abd/add     Cryotherapy   Number Minutes Cryotherapy 10 Minutes   Type of Cryotherapy Ice pack     Manual Therapy   Manual Therapy Soft tissue mobilization;Passive ROM   Soft tissue mobilization pect                     PT Long Term Goals - 09/07/16 1142      PT LONG TERM GOAL #1   Title Pt will improve L shoulder flexion to 125 or greater   Baseline 128   Status Achieved     PT LONG TERM GOAL #2   Title Pt will report improved ability to complete hair with the L UE   Status Partially Met     PT LONG TERM GOAL #3   Title Pt will be independent with HEP for continued strength and mobility of the shoulders   Status On-going               Plan -  09/07/16 1141    Clinical Impression Statement increased func strength and ROM slowly improving. pt reports improvements at home. Progressing with LTGs   PT Next Visit Plan shld strength/ROM and endurance      Patient will benefit from skilled therapeutic intervention in order to improve the following deficits and impairments:  Decreased mobility, Impaired UE functional use, Pain  Visit Diagnosis: Chronic left shoulder pain  Stiffness of left shoulder, not elsewhere classified     Problem List Patient Active Problem List   Diagnosis Date Noted  . Hematoma (nontraumatic) of breast 04/02/2016  . Anemia 04/02/2016  . Acute blood loss anemia   . Hypotension   . Encounter for central line placement   . 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 Breast cancer of upper-outer quadrant of left female breast ([place]) 01/06/2016    PAYSEUR,ANGIE PTA 09/07/2016, 11:44 AM  [place], [place], [address] Phone: [phone]   Fax:  [phone]  Name: LOVEAH LIKE MRN: [id number] Date of Birth: 21-Oct-1950

## 2016-09-11 ENCOUNTER — Ambulatory Visit: Payer: PPO | Admitting: Rehabilitation

## 2016-09-11 DIAGNOSIS — N184 Chronic kidney disease, stage 4 (severe): Secondary | ICD-10-CM | POA: Diagnosis not present

## 2016-09-11 DIAGNOSIS — G8929 Other chronic pain: Secondary | ICD-10-CM

## 2016-09-11 DIAGNOSIS — M25611 Stiffness of right shoulder, not elsewhere classified: Secondary | ICD-10-CM

## 2016-09-11 DIAGNOSIS — M25511 Pain in right shoulder: Secondary | ICD-10-CM

## 2016-09-11 DIAGNOSIS — I129 Hypertensive chronic kidney disease with stage 1 through stage 4 chronic kidney disease, or unspecified chronic kidney disease: Secondary | ICD-10-CM | POA: Diagnosis not present

## 2016-09-11 DIAGNOSIS — M25612 Stiffness of left shoulder, not elsewhere classified: Secondary | ICD-10-CM

## 2016-09-11 DIAGNOSIS — M25512 Pain in left shoulder: Secondary | ICD-10-CM | POA: Diagnosis not present

## 2016-09-11 DIAGNOSIS — B373 Candidiasis of vulva and vagina: Secondary | ICD-10-CM | POA: Diagnosis not present

## 2016-09-11 DIAGNOSIS — R1313 Dysphagia, pharyngeal phase: Secondary | ICD-10-CM | POA: Diagnosis not present

## 2016-09-11 DIAGNOSIS — E538 Deficiency of other specified B group vitamins: Secondary | ICD-10-CM | POA: Diagnosis not present

## 2016-09-11 DIAGNOSIS — E785 Hyperlipidemia, unspecified: Secondary | ICD-10-CM | POA: Diagnosis not present

## 2016-09-11 DIAGNOSIS — D649 Anemia, unspecified: Secondary | ICD-10-CM | POA: Diagnosis not present

## 2016-09-11 DIAGNOSIS — J4530 Mild persistent asthma, uncomplicated: Secondary | ICD-10-CM | POA: Diagnosis not present

## 2016-09-11 NOTE — Therapy (Signed)
[place], [place], [address] Phone: [phone]   Fax:  [phone]  Physical Therapy Treatment  Patient Details  Name: Maureen [name] MRN: [id number] Date of Birth: [birth year] Referring Provider: toth  Encounter Date: 09/11/2016      PT End of Session - 09/11/16 1445    Visit Number 7   Date for PT Re-Evaluation 10/15/16   PT Start Time 1408   PT Stop Time 1444   PT Time Calculation (min) 36 min   Activity Tolerance Patient tolerated treatment well      Past Medical History:  Diagnosis Date  . Anemia   . Arthritis   . Atrial fibrillation ([place])   . Atrial flutter ([place])   . Breast cancer of upper-outer quadrant of left female breast ([place]) 01/06/2016  . Bronchitis    with a cold  . CHF (congestive heart failure) ([place])   . Chronic kidney disease   . Diabetes ([place])   . Edema   . Family history of adverse reaction to anesthesia    " my father was hard to put under."  . Fatty liver   . Fibromyalgia   . GERD (gastroesophageal reflux disease)   . Hepatitis A   . History of blood transfusion reaction   . Hypertension   . Hypothyroidism   . IBS (irritable bowel syndrome)   . Insomnia   . Osteoarthritis   . Sleep apnea   . Vitamin B deficiency   . Wears glasses     Past Surgical History:  Procedure Laterality Date  . ABDOMINAL HYSTERECTOMY    . BREAST SURGERY     left breast biopsy  . CARDIAC CATHETERIZATION     05/07/14 Cardiac cath: Normal coronaries, NL LVF, EF 65% ([facility])  . CARPAL TUNNEL RELEASE    . CESAREAN SECTION    . CHOLECYSTECTOMY    . DILATION AND CURETTAGE OF UTERUS    . EVACUATION BREAST HEMATOMA Left 04/02/2016   Procedure: EVACUATION HEMATOMA LEFT BREAST;  Surgeon: [name] III, MD;  [facility];  Service: General;  Laterality: Left;  . EYE SURGERY     strabismus and B/L cataracts  . HERNIA REPAIR    . MASTECTOMY W/ SENTINEL NODE BIOPSY  Bilateral 03/21/2016   Procedure: LEFT MASTECTOMY WITH LEFT SENTINEL LYMPH NODE BIOPSY, RIGHT PROPHYLACTIC MASTECTOMY;  Surgeon: [name] III, MD;  Location: [place];  Service: General;  Laterality: Bilateral;  . TONSILLECTOMY    . TRANSTHORACIC ECHOCARDIOGRAM     05/06/14 TTE [facility]): Mild concentric LVH, LVEF prob low normal (diff to eval due to rapid AF), mild MR.    There were no vitals filed for this visit.      Subjective Assessment - 09/11/16 1410    Subjective very tired today.  very sore since last visit   Currently in Pain? Yes   Pain Score 1    Pain Location Shoulder   Pain Orientation Left   Pain Descriptors / Indicators Aching   Aggravating Factors  reaching, lifting   Pain Relieving Factors tramadol                         [facility] Adult PT Treatment/Exercise - 09/11/16 0001      Shoulder Exercises: Supine   Protraction Left;10 reps   Protraction Weight (lbs) 3; serratus punch   Other Supine Exercises 3# c/cc circles  Shoulder Exercises: Standing   Row Strengthening;Left  pulley 10#   Other Standing Exercises finger ladder flex and bad 10 times 2# ecc lowering   Other Standing Exercises pulley punch 5# x 9     Shoulder Exercises: ROM/Strengthening   UBE (Upper Arm Bike) Nustep standing left arm 3 min push/pull and 3 min horz abd/add     Cryotherapy   Number Minutes Cryotherapy --  declines ice today due to not feeling well     Manual Therapy   Manual Therapy Passive ROM   Manual therapy comments to tolerance all directions; CR into ER x 3, pectoralis manual stretch                     PT Long Term Goals - 09/07/16 1142      PT LONG TERM GOAL #1   Title Pt will improve L shoulder flexion to 125 or greater   Baseline 128   Status Achieved     PT LONG TERM GOAL #2   Title Pt will report improved ability to complete hair with the L UE   Status Partially Met     PT LONG TERM GOAL #3   Title Pt will be  independent with HEP for continued strength and mobility of the shoulders   Status On-going               Plan - 09/11/16 1445    Clinical Impression Statement not feeling well today.  declined modalities and much exercise.  did not sleep well and on medication.  tolerated exercises well.     PT Next Visit Plan shld strength/ROM and endurance      Patient will benefit from skilled therapeutic intervention in order to improve the following deficits and impairments:     Visit Diagnosis: Chronic left shoulder pain  Stiffness of left shoulder, not elsewhere classified  Chronic right shoulder pain  Stiffness of right shoulder, not elsewhere classified     Problem List Patient Active Problem List   Diagnosis Date Noted  . Hematoma (nontraumatic) of breast 04/02/2016  . Anemia 04/02/2016  . Acute blood loss anemia   . Hypotension   . Encounter for central line placement   . 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 Breast cancer of upper-outer quadrant of left female breast ([place]) 01/06/2016    Stark Bray, DPT, CMP 09/11/2016, 2:47 PM  [place], [place], [address] Phone: [phone]   Fax:  [phone]  Name: Maureen [name] MRN: [id number] Date of Birth: 16-Oct-1950

## 2016-09-13 DIAGNOSIS — G4733 Obstructive sleep apnea (adult) (pediatric): Secondary | ICD-10-CM | POA: Diagnosis not present

## 2016-09-13 DIAGNOSIS — H04123 Dry eye syndrome of bilateral lacrimal glands: Secondary | ICD-10-CM | POA: Diagnosis not present

## 2016-09-13 DIAGNOSIS — Z961 Presence of intraocular lens: Secondary | ICD-10-CM | POA: Diagnosis not present

## 2016-09-13 DIAGNOSIS — E119 Type 2 diabetes mellitus without complications: Secondary | ICD-10-CM | POA: Diagnosis not present

## 2016-09-13 DIAGNOSIS — H4322 Crystalline deposits in vitreous body, left eye: Secondary | ICD-10-CM | POA: Diagnosis not present

## 2016-09-14 ENCOUNTER — Ambulatory Visit: Payer: PPO | Admitting: Physical Therapy

## 2016-09-14 DIAGNOSIS — R293 Abnormal posture: Secondary | ICD-10-CM

## 2016-09-14 DIAGNOSIS — M25512 Pain in left shoulder: Principal | ICD-10-CM

## 2016-09-14 DIAGNOSIS — G8929 Other chronic pain: Secondary | ICD-10-CM

## 2016-09-14 DIAGNOSIS — M25611 Stiffness of right shoulder, not elsewhere classified: Secondary | ICD-10-CM

## 2016-09-14 DIAGNOSIS — M25612 Stiffness of left shoulder, not elsewhere classified: Secondary | ICD-10-CM

## 2016-09-14 DIAGNOSIS — M25511 Pain in right shoulder: Secondary | ICD-10-CM

## 2016-09-14 NOTE — Therapy (Signed)
[place], [place], [address] Phone: [phone]   Fax:  [phone]  Physical Therapy Treatment  Patient Details  Name: ARLETTE SCHAAD MRN: [id number] Date of Birth: [birth year] Referring Provider: toth  Encounter Date: 09/14/2016      PT End of Session - 09/14/16 1149    Visit Number 8   Date for PT Re-Evaluation 10/15/16   PT Start Time 1100   PT Stop Time 1145   PT Time Calculation (min) 45 min   Activity Tolerance Patient tolerated treatment well      Past Medical History:  Diagnosis Date   Anemia    Arthritis    Atrial fibrillation ([place])    Atrial flutter ([place])    Breast cancer of upper-outer quadrant of left female breast ([place]) 01/06/2016   Bronchitis    with a cold   CHF (congestive heart failure) ([place])    Chronic kidney disease    Diabetes ([place])    Edema    Family history of adverse reaction to anesthesia    " my father was hard to put under."   Fatty liver    Fibromyalgia    GERD (gastroesophageal reflux disease)    Hepatitis A    History of blood transfusion reaction    Hypertension    Hypothyroidism    IBS (irritable bowel syndrome)    Insomnia    Osteoarthritis    Sleep apnea    Vitamin B deficiency    Wears glasses     Past Surgical History:  Procedure Laterality Date   ABDOMINAL HYSTERECTOMY     BREAST SURGERY     left breast biopsy   CARDIAC CATHETERIZATION     05/07/14 Cardiac cath: Normal coronaries, NL LVF, EF 65% ([facility])   CARPAL TUNNEL RELEASE     CESAREAN SECTION     CHOLECYSTECTOMY     DILATION AND CURETTAGE OF UTERUS     EVACUATION BREAST HEMATOMA Left 04/02/2016   Procedure: EVACUATION HEMATOMA LEFT BREAST;  Surgeon: [name] III, MD;  [facility];  Service: General;  Laterality: Left;   EYE SURGERY     strabismus and B/L cataracts   HERNIA REPAIR     MASTECTOMY W/ SENTINEL NODE BIOPSY  Bilateral 03/21/2016   Procedure: LEFT MASTECTOMY WITH LEFT SENTINEL LYMPH NODE BIOPSY, RIGHT PROPHYLACTIC MASTECTOMY;  Surgeon: [name] III, MD;  [facility];  Service: General;  Laterality: Bilateral;   TONSILLECTOMY     TRANSTHORACIC ECHOCARDIOGRAM     05/06/14 TTE ([facility]): Mild concentric LVH, LVEF prob low normal (diff to eval due to rapid AF), mild MR.    There were no vitals filed for this visit.      Subjective Assessment - 09/14/16 1148    Subjective Has not been feeling great this week.  Left shoulder pain has made sleeping more difficult   Pain Score 2    Pain Location Shoulder   Pain Orientation Left                         [facility] Adult PT Treatment/Exercise - 09/14/16 0001      Shoulder Exercises: Supine   Protraction Left;10 reps   Protraction Weight (lbs) 3; serratus punch   Flexion Strengthening;Left;10 reps;Weights   Shoulder Flexion Weight (lbs) 3   Other Supine Exercises 3# ABC in supine     Shoulder Exercises: Standing  Other Standing Exercises finger ladder flex and bad 10 times 2# ecc lowering     Shoulder Exercises: ROM/Strengthening   UBE (Upper Arm Bike) Nustep standing left arm 4 min push/pull      Manual Therapy   Manual Therapy Passive ROM   Manual therapy comments to tolerance all directions; CR into ER x 3, pectoralis manual stretch   Soft tissue mobilization pect                     PT Long Term Goals - 09/07/16 1142      PT LONG TERM GOAL #1   Title Pt will improve L shoulder flexion to 125 or greater   Baseline 128   Status Achieved     PT LONG TERM GOAL #2   Title Pt will report improved ability to complete hair with the L UE   Status Partially Met     PT LONG TERM GOAL #3   Title Pt will be independent with HEP for continued strength and mobility of the shoulders   Status On-going               Plan - 09/14/16 1150    Clinical Impression Statement Declined modalities.   End ROM pain in all planes.  ROM restricted by soft tissue limitation and pain. Increased crepitus with IR.  Fatigues quickly with PREs.      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

## 2016-09-18 DIAGNOSIS — Z9011 Acquired absence of right breast and nipple: Secondary | ICD-10-CM | POA: Diagnosis not present

## 2016-09-18 DIAGNOSIS — D0512 Intraductal carcinoma in situ of left breast: Secondary | ICD-10-CM | POA: Diagnosis not present

## 2016-09-25 ENCOUNTER — Ambulatory Visit: Payer: PPO | Attending: General Surgery | Admitting: Physical Therapy

## 2016-09-25 ENCOUNTER — Encounter: Payer: Self-pay | Admitting: Physical Therapy

## 2016-09-25 DIAGNOSIS — G8929 Other chronic pain: Secondary | ICD-10-CM

## 2016-09-25 DIAGNOSIS — M25512 Pain in left shoulder: Secondary | ICD-10-CM | POA: Diagnosis not present

## 2016-09-25 DIAGNOSIS — M25612 Stiffness of left shoulder, not elsewhere classified: Secondary | ICD-10-CM | POA: Diagnosis not present

## 2016-09-25 NOTE — Therapy (Addendum)
[place], [place], [address] Phone: [phone]   Fax:  [phone]  Physical Therapy Treatment  Patient Details  Name: Maureen [name] MRN: [id number] Date of Birth: Jul 23, 1950 Referring Provider: toth  Encounter Date: 09/25/2016      PT End of Session - 09/25/16 1302    Visit Number 9   Date for PT Re-Evaluation 10/15/16   PT Start Time 1230   PT Stop Time 1300   PT Time Calculation (min) 30 min      Past Medical History:  Diagnosis Date  . Anemia   . Arthritis   . Atrial fibrillation ([place])   . Atrial flutter ([place])   . Breast cancer of upper-outer quadrant of left female breast ([place]) 01/06/2016  . Bronchitis    with a cold  . CHF (congestive heart failure) ([place])   . Chronic kidney disease   . Diabetes ([place])   . Edema   . Family history of adverse reaction to anesthesia    " my father was hard to put under."  . Fatty liver   . Fibromyalgia   . GERD (gastroesophageal reflux disease)   . Hepatitis A   . History of blood transfusion reaction   . Hypertension   . Hypothyroidism   . IBS (irritable bowel syndrome)   . Insomnia   . Osteoarthritis   . Sleep apnea   . Vitamin B deficiency   . Wears glasses     Past Surgical History:  Procedure Laterality Date  . ABDOMINAL HYSTERECTOMY    . BREAST SURGERY     left breast biopsy  . CARDIAC CATHETERIZATION     05/07/14 Cardiac cath: Normal coronaries, NL LVF, EF 65% ([facility])  . CARPAL TUNNEL RELEASE    . CESAREAN SECTION    . CHOLECYSTECTOMY    . DILATION AND CURETTAGE OF UTERUS    . EVACUATION BREAST HEMATOMA Left 04/02/2016   Procedure: EVACUATION HEMATOMA LEFT BREAST;  Surgeon: [name] III, MD;  [facility];  Service: General;  Laterality: Left;  . EYE SURGERY     strabismus and B/L cataracts  . HERNIA REPAIR    . MASTECTOMY W/ SENTINEL NODE BIOPSY Bilateral 03/21/2016   Procedure: LEFT MASTECTOMY WITH  LEFT SENTINEL LYMPH NODE BIOPSY, RIGHT PROPHYLACTIC MASTECTOMY;  Surgeon: [name] III, MD;  Location: [facility];  Service: General;  Laterality: Bilateral;  . TONSILLECTOMY    . TRANSTHORACIC ECHOCARDIOGRAM     05/06/14 TTE [facility]): Mild concentric LVH, LVEF prob low normal (diff to eval due to rapid AF), mild MR.    There were no vitals filed for this visit.      Subjective Assessment - 09/25/16 1255    Subjective not good today- difficulty breathing. shld has been worse with increased constant pain. not doing any ex at home d/t pain   Currently in Pain? Yes   Pain Score 5    Pain Location Shoulder   Pain Orientation Left            [facility] PT Assessment - 09/25/16 0001      AROM   Left Shoulder Flexion 127 Degrees   Left Shoulder ABduction 130 Degrees   Left Shoulder Internal Rotation 70 Degrees   Left Shoulder External Rotation 45 Degrees  PT Education - 09/25/16 1257    Education provided Yes   Education Details red tband scap retraction and ext, ER and bicpe curl 10 times 2-3 tomes daily. ROM on table flex and abd, cane abd, AA flex   Person(s) Educated Patient   Methods Explanation;Demonstration;Handout   Comprehension Verbalized understanding;Returned demonstration             PT Long Term Goals - 09/07/16 1142      PT LONG TERM GOAL #1   Title Pt will improve L shoulder flexion to 125 or greater   Baseline 128   Status Achieved     PT LONG TERM GOAL #2   Title Pt will report improved ability to complete hair with the L UE   Status Partially Met     PT LONG TERM GOAL #3   Title Pt will be independent with HEP for continued strength and mobility of the shoulders   Status On-going               Plan - 09/25/16 1302    Clinical Impression Statement pt concorned if PT is helping or making worse. agreed to HEP which we reviewed and streamlined for 2 weeks then reasssess. ROM taken today  to compare. No goal progress.   PT Next Visit Plan HOLD 2 weeks . Rec pt call shld MD      Patient will benefit from skilled therapeutic intervention in order to improve the following deficits and impairments:     Visit Diagnosis: Chronic left shoulder pain  Stiffness of left shoulder, not elsewhere classified     Problem List Patient Active Problem List   Diagnosis Date Noted  . Hematoma (nontraumatic) of breast 04/02/2016  . Anemia 04/02/2016  . Acute blood loss anemia   . Hypotension   . Encounter for central line placement   . 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 Breast cancer of upper-outer quadrant of left female breast ([place]) 01/06/2016   PHYSICAL THERAPY DISCHARGE SUMMARY   Plan: Patient agrees to discharge.  Patient goals were not met. Patient is being discharged due to not returning since the last visit.  ?????      PAYSEUR,ANGIE PTA 09/25/2016, 1:04 PM  [place] [address] [place], [place], [address] Phone: [phone]   Fax:  [phone]  Name: Maureen [name] MRN: [id number] Date of Birth: 10-Oct-1950

## 2016-09-27 ENCOUNTER — Ambulatory Visit: Payer: PPO | Admitting: Physical Therapy

## 2016-09-27 DIAGNOSIS — I509 Heart failure, unspecified: Secondary | ICD-10-CM | POA: Diagnosis not present

## 2016-09-27 DIAGNOSIS — I119 Hypertensive heart disease without heart failure: Secondary | ICD-10-CM | POA: Diagnosis not present

## 2016-09-27 DIAGNOSIS — E039 Hypothyroidism, unspecified: Secondary | ICD-10-CM | POA: Diagnosis not present

## 2016-09-27 DIAGNOSIS — E119 Type 2 diabetes mellitus without complications: Secondary | ICD-10-CM | POA: Diagnosis not present

## 2016-10-16 DIAGNOSIS — R1313 Dysphagia, pharyngeal phase: Secondary | ICD-10-CM | POA: Diagnosis not present

## 2016-10-16 DIAGNOSIS — J4520 Mild intermittent asthma, uncomplicated: Secondary | ICD-10-CM | POA: Diagnosis not present

## 2016-10-16 DIAGNOSIS — G252 Other specified forms of tremor: Secondary | ICD-10-CM | POA: Diagnosis not present

## 2016-10-19 DIAGNOSIS — D649 Anemia, unspecified: Secondary | ICD-10-CM | POA: Diagnosis not present

## 2016-10-19 DIAGNOSIS — N189 Chronic kidney disease, unspecified: Secondary | ICD-10-CM | POA: Diagnosis not present

## 2016-10-23 DIAGNOSIS — R1310 Dysphagia, unspecified: Secondary | ICD-10-CM | POA: Diagnosis not present

## 2016-10-23 DIAGNOSIS — R1313 Dysphagia, pharyngeal phase: Secondary | ICD-10-CM | POA: Diagnosis not present

## 2016-11-07 DIAGNOSIS — M19012 Primary osteoarthritis, left shoulder: Secondary | ICD-10-CM | POA: Diagnosis not present

## 2016-11-19 DIAGNOSIS — R197 Diarrhea, unspecified: Secondary | ICD-10-CM | POA: Diagnosis not present

## 2016-11-19 DIAGNOSIS — G4733 Obstructive sleep apnea (adult) (pediatric): Secondary | ICD-10-CM | POA: Diagnosis not present

## 2016-11-19 DIAGNOSIS — K580 Irritable bowel syndrome with diarrhea: Secondary | ICD-10-CM | POA: Diagnosis not present

## 2016-11-28 DIAGNOSIS — H8110 Benign paroxysmal vertigo, unspecified ear: Secondary | ICD-10-CM | POA: Diagnosis not present

## 2016-11-28 DIAGNOSIS — K225 Diverticulum of esophagus, acquired: Secondary | ICD-10-CM | POA: Diagnosis not present

## 2016-11-28 DIAGNOSIS — A09 Infectious gastroenteritis and colitis, unspecified: Secondary | ICD-10-CM | POA: Diagnosis not present

## 2016-11-30 DIAGNOSIS — G4733 Obstructive sleep apnea (adult) (pediatric): Secondary | ICD-10-CM | POA: Diagnosis not present

## 2016-12-03 DIAGNOSIS — K225 Diverticulum of esophagus, acquired: Secondary | ICD-10-CM | POA: Diagnosis not present

## 2016-12-03 DIAGNOSIS — H8111 Benign paroxysmal vertigo, right ear: Secondary | ICD-10-CM | POA: Diagnosis not present

## 2016-12-17 DIAGNOSIS — G4700 Insomnia, unspecified: Secondary | ICD-10-CM | POA: Diagnosis not present

## 2016-12-17 DIAGNOSIS — R251 Tremor, unspecified: Secondary | ICD-10-CM | POA: Diagnosis not present

## 2016-12-17 DIAGNOSIS — R413 Other amnesia: Secondary | ICD-10-CM | POA: Diagnosis not present

## 2016-12-17 DIAGNOSIS — R430 Anosmia: Secondary | ICD-10-CM | POA: Diagnosis not present

## 2016-12-19 DIAGNOSIS — I5032 Chronic diastolic (congestive) heart failure: Secondary | ICD-10-CM | POA: Diagnosis not present

## 2016-12-19 DIAGNOSIS — M1000 Idiopathic gout, unspecified site: Secondary | ICD-10-CM | POA: Diagnosis not present

## 2016-12-19 DIAGNOSIS — N183 Chronic kidney disease, stage 3 (moderate): Secondary | ICD-10-CM | POA: Diagnosis not present

## 2016-12-19 DIAGNOSIS — I10 Essential (primary) hypertension: Secondary | ICD-10-CM | POA: Diagnosis not present

## 2016-12-28 DIAGNOSIS — M19012 Primary osteoarthritis, left shoulder: Secondary | ICD-10-CM | POA: Diagnosis not present

## 2016-12-31 ENCOUNTER — Other Ambulatory Visit: Payer: Self-pay | Admitting: Orthopedic Surgery

## 2016-12-31 DIAGNOSIS — M19012 Primary osteoarthritis, left shoulder: Secondary | ICD-10-CM

## 2017-01-03 ENCOUNTER — Ambulatory Visit
Admission: RE | Admit: 2017-01-03 | Discharge: 2017-01-03 | Disposition: A | Discharge status: Home / Self Care | Payer: PPO | Source: Ambulatory Visit | Attending: Orthopedic Surgery | Admitting: Orthopedic Surgery

## 2017-01-03 DIAGNOSIS — M19012 Primary osteoarthritis, left shoulder: Secondary | ICD-10-CM

## 2017-01-04 DIAGNOSIS — R413 Other amnesia: Secondary | ICD-10-CM | POA: Diagnosis not present

## 2017-01-04 DIAGNOSIS — R430 Anosmia: Secondary | ICD-10-CM | POA: Diagnosis not present

## 2017-01-07 DIAGNOSIS — M25512 Pain in left shoulder: Secondary | ICD-10-CM | POA: Diagnosis not present

## 2017-01-07 DIAGNOSIS — M25612 Stiffness of left shoulder, not elsewhere classified: Secondary | ICD-10-CM | POA: Diagnosis not present

## 2017-01-08 DIAGNOSIS — D0512 Intraductal carcinoma in situ of left breast: Secondary | ICD-10-CM | POA: Diagnosis not present

## 2017-01-09 DIAGNOSIS — D520 Dietary folate deficiency anemia: Secondary | ICD-10-CM | POA: Diagnosis not present

## 2017-01-09 DIAGNOSIS — N189 Chronic kidney disease, unspecified: Secondary | ICD-10-CM | POA: Diagnosis not present

## 2017-01-09 DIAGNOSIS — D649 Anemia, unspecified: Secondary | ICD-10-CM | POA: Diagnosis not present

## 2017-01-18 NOTE — Pre-Procedure Instructions (Signed)
Maureen [name]  01/18/2017      [id number] [place], [place] - [address] [place] [address] Phone: [phone] Fax: [phone]    Your procedure is scheduled on Thursday, October 4th.   Report to [facility] Admitting at 5:30AM             (posted surgery time 7:30a- 10a)   Call this number if you have problems the MORNING of surgery:  [phone].   Remember:              4-5 days prior to surgery, STOP TAKING any Vitamins, Herbal Supplements, Anti-inflammatories   Do not eat food or drink liquids after midnight Wednesday.   Take these medicines the morning of surgery with A SIP OF WATER : Amiodarone, Coreg, Synthroid.  Please use your inhaler that morning.               Eliquis - last dose will be taken ____________________________              DO NOT TAKE ANY DIABETES MEDICATION THE MORNING OF SURGERY.   Do not wear jewelry, make-up or nail polish.  Do not wear lotions, powders,  perfumes, or deoderant.  Do not shave 48 hours prior to surgery.     Do not bring valuables to the hospital.  [facility] is not responsible for any belongings or valuables.  Contacts, dentures or bridgework may not be worn into surgery.  Leave your suitcase in the car.  After surgery it may be brought to your room.  For patients admitted to the hospital, discharge time will be determined by your treatment team.  Please read over the following fact sheets that you were given. Pain Booklet, MRSA Information and Surgical Site Infection Prevention   [facility]- Preparing For Surgery  Before surgery, you can play an important role. Because skin is not sterile, your skin needs to be as free of germs as possible. You can reduce the number of germs on your skin by washing with CHG (chlorahexidine gluconate) Soap before surgery.  CHG is an antiseptic cleaner which kills germs and bonds with the skin to continue killing germs even after  washing.  Please do not use if you have an allergy to CHG or antibacterial soaps. If your skin becomes reddened/irritated stop using the CHG.  Do not shave (including legs and underarms) for at least 48 hours prior to first CHG shower. It is OK to shave your face.  Please follow these instructions carefully.   1. Shower the NIGHT BEFORE SURGERY and the MORNING OF SURGERY with CHG.   2. If you chose to wash your hair, wash your hair first as usual with your normal shampoo.  3. After you shampoo, rinse your hair and body thoroughly to remove the shampoo.  4. Use CHG as you would any other liquid soap. You can apply CHG directly to the skin and wash gently with a scrungie or a clean washcloth.   5. Apply the CHG Soap to your body ONLY FROM THE NECK DOWN.  Do not use on open wounds or open sores. Avoid contact with your eyes, ears, mouth and genitals (private parts). Wash genitals (private parts) with your normal soap.  6. Wash thoroughly, paying special attention to the area where your surgery will be performed.  7. Thoroughly rinse your body with warm water from the neck down.  8. DO NOT shower/wash with your normal soap  after using and rinsing off the CHG Soap.  9. Pat yourself dry with a CLEAN TOWEL.   10. Wear CLEAN PAJAMAS   11. Place CLEAN SHEETS on your bed the night of your first shower and DO NOT SLEEP WITH PETS.    Day of Surgery: Do not apply any deodorants/lotions. Please wear clean clothes to the hospital/surgery center.

## 2017-01-21 ENCOUNTER — Encounter (HOSPITAL_COMMUNITY): Payer: Self-pay

## 2017-01-21 ENCOUNTER — Ambulatory Visit (HOSPITAL_COMMUNITY)
Admission: RE | Admit: 2017-01-21 | Discharge: 2017-01-21 | Disposition: A | Discharge status: Home / Self Care | Payer: PPO | Source: Ambulatory Visit | Attending: Orthopedic Surgery | Admitting: Orthopedic Surgery

## 2017-01-21 ENCOUNTER — Encounter (HOSPITAL_COMMUNITY)
Admission: RE | Admit: 2017-01-21 | Discharge: 2017-01-21 | Disposition: A | Discharge status: Home / Self Care | Payer: PPO | Source: Ambulatory Visit | Attending: Orthopedic Surgery | Admitting: Orthopedic Surgery

## 2017-01-21 DIAGNOSIS — G4700 Insomnia, unspecified: Secondary | ICD-10-CM | POA: Diagnosis present

## 2017-01-21 DIAGNOSIS — J45909 Unspecified asthma, uncomplicated: Secondary | ICD-10-CM | POA: Insufficient documentation

## 2017-01-21 DIAGNOSIS — G4730 Sleep apnea, unspecified: Secondary | ICD-10-CM | POA: Diagnosis present

## 2017-01-21 DIAGNOSIS — N184 Chronic kidney disease, stage 4 (severe): Secondary | ICD-10-CM | POA: Diagnosis not present

## 2017-01-21 DIAGNOSIS — D649 Anemia, unspecified: Secondary | ICD-10-CM | POA: Insufficient documentation

## 2017-01-21 DIAGNOSIS — E1122 Type 2 diabetes mellitus with diabetic chronic kidney disease: Secondary | ICD-10-CM

## 2017-01-21 DIAGNOSIS — Z7901 Long term (current) use of anticoagulants: Secondary | ICD-10-CM | POA: Insufficient documentation

## 2017-01-21 DIAGNOSIS — I4892 Unspecified atrial flutter: Secondary | ICD-10-CM

## 2017-01-21 DIAGNOSIS — Z87891 Personal history of nicotine dependence: Secondary | ICD-10-CM | POA: Diagnosis not present

## 2017-01-21 DIAGNOSIS — Z1159 Encounter for screening for other viral diseases: Secondary | ICD-10-CM | POA: Diagnosis not present

## 2017-01-21 DIAGNOSIS — Z6841 Body Mass Index (BMI) 40.0 and over, adult: Secondary | ICD-10-CM | POA: Diagnosis not present

## 2017-01-21 DIAGNOSIS — K219 Gastro-esophageal reflux disease without esophagitis: Secondary | ICD-10-CM

## 2017-01-21 DIAGNOSIS — Z79899 Other long term (current) drug therapy: Secondary | ICD-10-CM | POA: Insufficient documentation

## 2017-01-21 DIAGNOSIS — Z803 Family history of malignant neoplasm of breast: Secondary | ICD-10-CM | POA: Diagnosis not present

## 2017-01-21 DIAGNOSIS — I509 Heart failure, unspecified: Secondary | ICD-10-CM

## 2017-01-21 DIAGNOSIS — N183 Chronic kidney disease, stage 3 (moderate): Secondary | ICD-10-CM

## 2017-01-21 DIAGNOSIS — I129 Hypertensive chronic kidney disease with stage 1 through stage 4 chronic kidney disease, or unspecified chronic kidney disease: Secondary | ICD-10-CM | POA: Diagnosis not present

## 2017-01-21 DIAGNOSIS — M797 Fibromyalgia: Secondary | ICD-10-CM | POA: Diagnosis present

## 2017-01-21 DIAGNOSIS — Z23 Encounter for immunization: Secondary | ICD-10-CM | POA: Diagnosis not present

## 2017-01-21 DIAGNOSIS — I4891 Unspecified atrial fibrillation: Secondary | ICD-10-CM

## 2017-01-21 DIAGNOSIS — M19012 Primary osteoarthritis, left shoulder: Secondary | ICD-10-CM | POA: Diagnosis present

## 2017-01-21 DIAGNOSIS — G4733 Obstructive sleep apnea (adult) (pediatric): Secondary | ICD-10-CM

## 2017-01-21 DIAGNOSIS — K589 Irritable bowel syndrome without diarrhea: Secondary | ICD-10-CM | POA: Diagnosis present

## 2017-01-21 DIAGNOSIS — E039 Hypothyroidism, unspecified: Secondary | ICD-10-CM | POA: Diagnosis present

## 2017-01-21 DIAGNOSIS — I130 Hypertensive heart and chronic kidney disease with heart failure and stage 1 through stage 4 chronic kidney disease, or unspecified chronic kidney disease: Secondary | ICD-10-CM | POA: Diagnosis present

## 2017-01-21 DIAGNOSIS — Z9013 Acquired absence of bilateral breasts and nipples: Secondary | ICD-10-CM

## 2017-01-21 DIAGNOSIS — Z01818 Encounter for other preprocedural examination: Secondary | ICD-10-CM | POA: Insufficient documentation

## 2017-01-21 DIAGNOSIS — Z01812 Encounter for preprocedural laboratory examination: Secondary | ICD-10-CM | POA: Insufficient documentation

## 2017-01-21 DIAGNOSIS — M25512 Pain in left shoulder: Secondary | ICD-10-CM | POA: Diagnosis present

## 2017-01-21 DIAGNOSIS — Z7984 Long term (current) use of oral hypoglycemic drugs: Secondary | ICD-10-CM | POA: Insufficient documentation

## 2017-01-21 DIAGNOSIS — D500 Iron deficiency anemia secondary to blood loss (chronic): Secondary | ICD-10-CM | POA: Diagnosis not present

## 2017-01-21 DIAGNOSIS — D62 Acute posthemorrhagic anemia: Secondary | ICD-10-CM | POA: Diagnosis not present

## 2017-01-21 DIAGNOSIS — Z96612 Presence of left artificial shoulder joint: Secondary | ICD-10-CM | POA: Diagnosis not present

## 2017-01-21 DIAGNOSIS — E119 Type 2 diabetes mellitus without complications: Secondary | ICD-10-CM | POA: Diagnosis not present

## 2017-01-21 DIAGNOSIS — Z471 Aftercare following joint replacement surgery: Secondary | ICD-10-CM | POA: Diagnosis not present

## 2017-01-21 DIAGNOSIS — Z853 Personal history of malignant neoplasm of breast: Secondary | ICD-10-CM | POA: Diagnosis not present

## 2017-01-21 DIAGNOSIS — G8918 Other acute postprocedural pain: Secondary | ICD-10-CM | POA: Diagnosis not present

## 2017-01-21 DIAGNOSIS — I517 Cardiomegaly: Secondary | ICD-10-CM | POA: Diagnosis not present

## 2017-01-21 DIAGNOSIS — Z9049 Acquired absence of other specified parts of digestive tract: Secondary | ICD-10-CM | POA: Diagnosis not present

## 2017-01-21 DIAGNOSIS — C50412 Malignant neoplasm of upper-outer quadrant of left female breast: Secondary | ICD-10-CM | POA: Diagnosis not present

## 2017-01-21 DIAGNOSIS — E539 Vitamin B deficiency, unspecified: Secondary | ICD-10-CM | POA: Diagnosis present

## 2017-01-21 DIAGNOSIS — N189 Chronic kidney disease, unspecified: Secondary | ICD-10-CM | POA: Diagnosis present

## 2017-01-21 DIAGNOSIS — K760 Fatty (change of) liver, not elsewhere classified: Secondary | ICD-10-CM

## 2017-01-21 DIAGNOSIS — E538 Deficiency of other specified B group vitamins: Secondary | ICD-10-CM | POA: Diagnosis not present

## 2017-01-21 DIAGNOSIS — Z96652 Presence of left artificial knee joint: Secondary | ICD-10-CM

## 2017-01-21 DIAGNOSIS — Z90710 Acquired absence of both cervix and uterus: Secondary | ICD-10-CM | POA: Diagnosis not present

## 2017-01-21 DIAGNOSIS — Z96653 Presence of artificial knee joint, bilateral: Secondary | ICD-10-CM | POA: Diagnosis present

## 2017-01-21 DIAGNOSIS — M8589 Other specified disorders of bone density and structure, multiple sites: Secondary | ICD-10-CM | POA: Diagnosis not present

## 2017-01-21 DIAGNOSIS — Z800 Family history of malignant neoplasm of digestive organs: Secondary | ICD-10-CM | POA: Diagnosis not present

## 2017-01-21 DIAGNOSIS — Z0000 Encounter for general adult medical examination without abnormal findings: Secondary | ICD-10-CM | POA: Diagnosis not present

## 2017-01-21 HISTORY — DX: Unspecified asthma, uncomplicated: J45.909

## 2017-01-21 LAB — CBC WITH DIFFERENTIAL/PLATELET
BASOS ABS: 0 10*3/uL (ref 0.0–0.1)
Basophils Relative: 0 %
EOS ABS: 0.2 10*3/uL (ref 0.0–0.7)
EOS PCT: 3 %
HCT: 30.8 % — ABNORMAL LOW (ref 36.0–46.0)
Hemoglobin: 9.5 g/dL — ABNORMAL LOW (ref 12.0–15.0)
LYMPHS PCT: 18 %
Lymphs Abs: 1.3 10*3/uL (ref 0.7–4.0)
MCH: 27.1 pg (ref 26.0–34.0)
MCHC: 30.8 g/dL (ref 30.0–36.0)
MCV: 87.7 fL (ref 78.0–100.0)
MONO ABS: 0.4 10*3/uL (ref 0.1–1.0)
Monocytes Relative: 6 %
Neutro Abs: 5.3 10*3/uL (ref 1.7–7.7)
Neutrophils Relative %: 73 %
Platelets: 334 10*3/uL (ref 150–400)
RBC: 3.51 MIL/uL — AB (ref 3.87–5.11)
RDW: 15.5 % (ref 11.5–15.5)
WBC: 7.3 10*3/uL (ref 4.0–10.5)

## 2017-01-21 LAB — COMPREHENSIVE METABOLIC PANEL
ALK PHOS: 68 U/L (ref 38–126)
ALT: 14 U/L (ref 14–54)
AST: 23 U/L (ref 15–41)
Albumin: 4 g/dL (ref 3.5–5.0)
Anion gap: 10 (ref 5–15)
BUN: 40 mg/dL — ABNORMAL HIGH (ref 6–20)
CALCIUM: 9.5 mg/dL (ref 8.9–10.3)
CHLORIDE: 99 mmol/L — AB (ref 101–111)
CO2: 28 mmol/L (ref 22–32)
CREATININE: 2.62 mg/dL — AB (ref 0.44–1.00)
GFR, EST AFRICAN AMERICAN: 21 mL/min — AB (ref >60)
GFR, EST NON AFRICAN AMERICAN: 18 mL/min — AB (ref >60)
Glucose, Bld: 151 mg/dL — ABNORMAL HIGH (ref 65–99)
Potassium: 4.4 mmol/L (ref 3.5–5.1)
Sodium: 137 mmol/L (ref 135–145)
Total Bilirubin: 0.6 mg/dL (ref 0.3–1.2)
Total Protein: 6.8 g/dL (ref 6.5–8.1)

## 2017-01-21 LAB — URINALYSIS, ROUTINE W REFLEX MICROSCOPIC
Bilirubin Urine: NEGATIVE
Glucose, UA: NEGATIVE mg/dL
Hgb urine dipstick: NEGATIVE
Ketones, ur: NEGATIVE mg/dL
Nitrite: NEGATIVE
Protein, ur: NEGATIVE mg/dL
SPECIFIC GRAVITY, URINE: 1.005 (ref 1.005–1.030)
SQUAMOUS EPITHELIAL / LPF: NONE SEEN
pH: 5 (ref 5.0–8.0)

## 2017-01-21 LAB — PROTIME-INR
INR: 1.32
PROTHROMBIN TIME: 16.3 s — AB (ref 11.4–15.2)

## 2017-01-21 LAB — SURGICAL PCR SCREEN
MRSA, PCR: NEGATIVE
Staphylococcus aureus: NEGATIVE

## 2017-01-21 LAB — APTT: APTT: 37 s — AB (ref 24–36)

## 2017-01-21 LAB — GLUCOSE, CAPILLARY: GLUCOSE-CAPILLARY: 97 mg/dL (ref 65–99)

## 2017-01-21 NOTE — Progress Notes (Signed)
PCP is Doug Sou, NP  LOV 01/21/2017  Had Hep panel, A!C, TSH, Vit B-12, Vit D draw today. Cardio is Dr. Adrian Prows.

## 2017-01-22 NOTE — Progress Notes (Addendum)
Anesthesia Chart Review: Patient is a 66 year old female scheduled for left shoulder arthroplasty on 01/24/17 by Dr. Tania Ade.   History includes former smoker (quit '08), DM2, HTN, fatty liver, hepatitis A, IBS, CKD stage III, left breast cancer s/p bilateral mastectomies 02/28/16 (Dr. [name]), OSA (CPAP), asthma, afib/flutter, CHF, hypothyroidism, insomnia, GERD, Zenker's diverticulum (no significant dysphagia, so diverticulotomy deferred 11/2016; saw ENT Dr. Hassell Done), fibromyalgia, edema, anemia, tonsillectomy, cholecystectomy, hysterectomy '08, left TKA '01. BMI is consistent with super morbid obesity. She reported history of post-operative CHF and that her father "was hard to put under [anesthesia]."  - PCP is Dr. Doug Sou with [facility] (Care Everywhere). Last visit 01/21/17 with Sofie Rower, FNP for Annual Medicare Wellness Exam. She is aware of surgery plans. - Pulmonologist is Dr. Francena Hanly with [facility] (Care Everywhere). - Cardiologist is Dr. Adrian Prows with [facility] (previously he was with [facility] until 12/22/16), last visit 08/06/16. AFib felt stable on amiodarone, but due to her kidney function may consider ablation in the future. For further discussion at her 6 month follow-up visit. He felt she was "medium risk" for surgery and recommended Eliquis be held for 48 hours prior to surgery. - Nephrologist is Dr. Ephriam Knuckles with [facility]. Records pending.  - Endocrinologist is Dr. Iran Planas with [facility] (Care Everywhere). - HEM-ONC is Dr. Nicholas Lose.   - Neurologist Dr. Karl Luke with [facility] (Care Everywhere).  Meds include albuterol, amiodarone, amitriptyline, Eliquis, Coreg, clindamycin (PRN dental procedures), doxazosin, fenofibrate, Lasix, metformin, Zaroxolyn, Synthroid, Zanaflex, tramadol, Ambien. Last Eliquis dose 01/21/17.  BP (!) 135/50   Pulse 72   Temp 36.6 C   Resp 20   Ht _0  (1.626 m)   Wt 278 lb 8 oz (126.3 kg)   SpO2 95%    BMI 47.80 kg/m   EKG 08/06/16 [facility]): SR, first degree AV block, occasional ectopic ventricular beat. Low voltage, consider possible pulmonary disease.  Echo 02/29/16 [facility]; scanned under Media tab, Correspondence 03/21/16): Conclusion:  1. Mild concentric LVH with normal LV systolic function. LVEF 60%. 2. Normal diastolic filling pattern, normal LAP. 3. Mild LAE. 4. Mild MAC with mild MR. 5. Normal right ventricular function.  Cardiac cath 05/07/14 [facility]; scanned under Media tab, Correspondence 03/21/16): Normal coronary arteries, normal LVF with EF 65%.  2V CXR 01/21/17:  IMPRESSION: 1.  Stable cardiomegaly. No acute cardiopulmonary abnormality. 2. Mild T12 compression fracture is new since March but age indeterminate.  Polysomnogram Report 06/15/14 ([facility]; scanned under Media tab, Correspondence 03/21/16): Findings: 1. Moderate OSA (AHI is 19.1) with desaturation to 63% and 0 extrasystoles. 2. Severe snoring. 3. Moderate periodic limb movement disorder. 4. No significant RLS. 5. A 4 was scored on the Epworth Sleepiness Scale. 6. Obese BMI of 50.1.  Brain MRI 01/05/17 [facility]; Care Everywhere): Result Impression: Normal unenhanced MRI of the brain.  Preoperative labs noted. PT 16.3, INR 1.32, PTT 37. H/H 9.5/30.8, consistent with labs from 10/19/16 and 06/20/16 in [place]. BUN 40, Cr 2.62 (previously Cr 3.06 on 10/19/16 and 2.86 08/10/16 in Care Everywhere and 2.49 04/05/16 and 2.82 04/04/16 in [facility]), glucose 151, K 4.4. AST/ALT WNL. A1c on 01/21/17 was 6.0 in Care Everywhere.  HGB and Cr results called to [facility] at Dr. Laurena Bering office.  I am still awaiting nephrology records, but according to comparison labs, results appear stable. Since HGB < 10, will add a T&S for the day of surgery. Based  on currently available information, I anticipate that she can proceed as planned if no acute changes. (Update 01/22/17 5:30 PM:  Last office note with labs received from Dr. Brayton El. Last visit 09/05/16. Response to increased diuretics had resulted in rise in Cr to 2.8. Last Cr there was 3.00. Preoperative Cr 2.62.)    George Hugh [facility] Short Stay Center/Anesthesiology Phone [phone] 01/22/2017 1:44 PM

## 2017-01-23 MED ORDER — VANCOMYCIN HCL 10 G IV SOLR
1500.0000 mg | INTRAVENOUS | Status: AC
  Administered 2017-01-24: 1500 mg via INTRAVENOUS
  Filled 2017-01-23: qty 1500

## 2017-01-23 NOTE — Anesthesia Preprocedure Evaluation (Addendum)
Anesthesia Evaluation  Patient identified by MRN, date of birth, ID band Patient awake    Reviewed: Allergy & Precautions, H&P , NPO status , Patient's Chart, lab work & pertinent test results, reviewed documented beta blocker date and time   Airway Mallampati: III  TM Distance: >3 FB Neck ROM: Full    Dental  (+) Teeth Intact, Dental Advisory Given   Pulmonary asthma , sleep apnea and Continuous Positive Airway Pressure Ventilation , former smoker,    breath sounds clear to auscultation       Cardiovascular hypertension, Pt. on medications and Pt. on home beta blockers +CHF  + dysrhythmias Atrial Fibrillation  Rhythm:Regular Rate:Normal     Neuro/Psych negative neurological ROS  negative psych ROS   GI/Hepatic neg GERD  ,(+) Hepatitis -, AIBS   Endo/Other  diabetes, Type 2, Oral Hypoglycemic AgentsHypothyroidism Morbid obesity  Renal/GU Renal InsufficiencyRenal disease  negative genitourinary   Musculoskeletal  (+) Arthritis , Osteoarthritis,  Fibromyalgia -  Abdominal (+) + obese,   Peds  Hematology  (+) anemia ,   Anesthesia Other Findings   Reproductive/Obstetrics negative OB ROS                            Anesthesia Physical  Anesthesia Plan  ASA: III  Anesthesia Plan: General   Post-op Pain Management:  Regional for Post-op pain   Induction: Intravenous  PONV Risk Score and Plan: 4 or greater and Ondansetron, Dexamethasone, Midazolam and Treatment may vary due to age or medical condition  Airway Management Planned: Oral ETT  Additional Equipment: None  Intra-op Plan:   Post-operative Plan: Extubation in OR  Informed Consent: I have reviewed the patients History and Physical, chart, labs and discussed the procedure including the risks, benefits and alternatives for the proposed anesthesia with the patient or authorized representative who has indicated his/her  understanding and acceptance.   Dental advisory given  Plan Discussed with: CRNA  Anesthesia Plan Comments:         Anesthesia Quick Evaluation

## 2017-01-24 ENCOUNTER — Inpatient Hospital Stay (HOSPITAL_COMMUNITY): Payer: PPO

## 2017-01-24 ENCOUNTER — Encounter (HOSPITAL_COMMUNITY)
Admission: RE | Disposition: A | Discharge status: Home / Self Care | Payer: Self-pay | Source: Ambulatory Visit | Attending: Orthopedic Surgery

## 2017-01-24 ENCOUNTER — Inpatient Hospital Stay (HOSPITAL_COMMUNITY): Payer: PPO | Admitting: Vascular Surgery

## 2017-01-24 ENCOUNTER — Inpatient Hospital Stay (HOSPITAL_COMMUNITY)
Admission: RE | Admit: 2017-01-24 | Discharge: 2017-01-25 | DRG: 483 | Disposition: A | Discharge status: Home / Self Care | Payer: PPO | Source: Ambulatory Visit | Attending: Orthopedic Surgery | Admitting: Orthopedic Surgery

## 2017-01-24 ENCOUNTER — Encounter (HOSPITAL_COMMUNITY): Payer: Self-pay

## 2017-01-24 DIAGNOSIS — Z9013 Acquired absence of bilateral breasts and nipples: Secondary | ICD-10-CM

## 2017-01-24 DIAGNOSIS — K760 Fatty (change of) liver, not elsewhere classified: Secondary | ICD-10-CM | POA: Diagnosis present

## 2017-01-24 DIAGNOSIS — E039 Hypothyroidism, unspecified: Secondary | ICD-10-CM | POA: Diagnosis present

## 2017-01-24 DIAGNOSIS — Z90710 Acquired absence of both cervix and uterus: Secondary | ICD-10-CM

## 2017-01-24 DIAGNOSIS — D62 Acute posthemorrhagic anemia: Secondary | ICD-10-CM | POA: Diagnosis not present

## 2017-01-24 DIAGNOSIS — Z853 Personal history of malignant neoplasm of breast: Secondary | ICD-10-CM

## 2017-01-24 DIAGNOSIS — I509 Heart failure, unspecified: Secondary | ICD-10-CM | POA: Diagnosis present

## 2017-01-24 DIAGNOSIS — Z87891 Personal history of nicotine dependence: Secondary | ICD-10-CM | POA: Diagnosis not present

## 2017-01-24 DIAGNOSIS — M797 Fibromyalgia: Secondary | ICD-10-CM | POA: Diagnosis present

## 2017-01-24 DIAGNOSIS — E1122 Type 2 diabetes mellitus with diabetic chronic kidney disease: Secondary | ICD-10-CM | POA: Diagnosis present

## 2017-01-24 DIAGNOSIS — Z800 Family history of malignant neoplasm of digestive organs: Secondary | ICD-10-CM | POA: Diagnosis not present

## 2017-01-24 DIAGNOSIS — M19012 Primary osteoarthritis, left shoulder: Principal | ICD-10-CM | POA: Diagnosis present

## 2017-01-24 DIAGNOSIS — Z888 Allergy status to other drugs, medicaments and biological substances status: Secondary | ICD-10-CM

## 2017-01-24 DIAGNOSIS — K589 Irritable bowel syndrome without diarrhea: Secondary | ICD-10-CM | POA: Diagnosis present

## 2017-01-24 DIAGNOSIS — M25512 Pain in left shoulder: Secondary | ICD-10-CM | POA: Diagnosis present

## 2017-01-24 DIAGNOSIS — I4891 Unspecified atrial fibrillation: Secondary | ICD-10-CM | POA: Diagnosis present

## 2017-01-24 DIAGNOSIS — Z803 Family history of malignant neoplasm of breast: Secondary | ICD-10-CM

## 2017-01-24 DIAGNOSIS — I4892 Unspecified atrial flutter: Secondary | ICD-10-CM | POA: Diagnosis present

## 2017-01-24 DIAGNOSIS — N189 Chronic kidney disease, unspecified: Secondary | ICD-10-CM | POA: Diagnosis present

## 2017-01-24 DIAGNOSIS — G4730 Sleep apnea, unspecified: Secondary | ICD-10-CM | POA: Diagnosis present

## 2017-01-24 DIAGNOSIS — I130 Hypertensive heart and chronic kidney disease with heart failure and stage 1 through stage 4 chronic kidney disease, or unspecified chronic kidney disease: Secondary | ICD-10-CM | POA: Diagnosis present

## 2017-01-24 DIAGNOSIS — Z6841 Body Mass Index (BMI) 40.0 and over, adult: Secondary | ICD-10-CM | POA: Diagnosis not present

## 2017-01-24 DIAGNOSIS — G4700 Insomnia, unspecified: Secondary | ICD-10-CM | POA: Diagnosis present

## 2017-01-24 DIAGNOSIS — Z9049 Acquired absence of other specified parts of digestive tract: Secondary | ICD-10-CM | POA: Diagnosis not present

## 2017-01-24 DIAGNOSIS — Z885 Allergy status to narcotic agent status: Secondary | ICD-10-CM

## 2017-01-24 DIAGNOSIS — E539 Vitamin B deficiency, unspecified: Secondary | ICD-10-CM | POA: Diagnosis present

## 2017-01-24 DIAGNOSIS — Z7984 Long term (current) use of oral hypoglycemic drugs: Secondary | ICD-10-CM

## 2017-01-24 DIAGNOSIS — Z96653 Presence of artificial knee joint, bilateral: Secondary | ICD-10-CM | POA: Diagnosis present

## 2017-01-24 DIAGNOSIS — Z881 Allergy status to other antibiotic agents status: Secondary | ICD-10-CM

## 2017-01-24 DIAGNOSIS — Z880 Allergy status to penicillin: Secondary | ICD-10-CM

## 2017-01-24 DIAGNOSIS — Z96612 Presence of left artificial shoulder joint: Secondary | ICD-10-CM

## 2017-01-24 DIAGNOSIS — Z7901 Long term (current) use of anticoagulants: Secondary | ICD-10-CM

## 2017-01-24 HISTORY — PX: TOTAL SHOULDER ARTHROPLASTY: SHX126

## 2017-01-24 LAB — TYPE AND SCREEN
ABO/RH(D): O POS
Antibody Screen: NEGATIVE

## 2017-01-24 LAB — GLUCOSE, CAPILLARY
GLUCOSE-CAPILLARY: 91 mg/dL (ref 65–99)
GLUCOSE-CAPILLARY: 90 mg/dL (ref 65–99)
GLUCOSE-CAPILLARY: 163 mg/dL — AB (ref 65–99)
Glucose-Capillary: 150 mg/dL — ABNORMAL HIGH (ref 65–99)

## 2017-01-24 LAB — ABO/RH: ABO/RH(D): O POS

## 2017-01-24 SURGERY — ARTHROPLASTY, SHOULDER, TOTAL
Anesthesia: General | Site: Shoulder | Laterality: Left

## 2017-01-24 MED ORDER — PROPOFOL 10 MG/ML IV BOLUS
INTRAVENOUS | Status: AC
  Filled 2017-01-24: qty 20

## 2017-01-24 MED ORDER — DEXAMETHASONE SODIUM PHOSPHATE 10 MG/ML IJ SOLN
INTRAMUSCULAR | Status: DC | PRN
  Administered 2017-01-24: 5 mg via INTRAVENOUS

## 2017-01-24 MED ORDER — FENTANYL CITRATE (PF) 250 MCG/5ML IJ SOLN
INTRAMUSCULAR | Status: AC
Start: 2017-01-24 — End: 2017-01-24
  Filled 2017-01-24: qty 5

## 2017-01-24 MED ORDER — CARVEDILOL 6.25 MG PO TABS
6.2500 mg | ORAL_TABLET | Freq: Two times a day (BID) | ORAL | Status: DC
  Administered 2017-01-24 – 2017-01-25 (×2): 6.25 mg via ORAL
  Filled 2017-01-24 (×2): qty 1

## 2017-01-24 MED ORDER — FENTANYL CITRATE (PF) 100 MCG/2ML IJ SOLN: 25.0000 ug | INTRAMUSCULAR | Status: DC | PRN

## 2017-01-24 MED ORDER — PHENYLEPHRINE HCL 10 MG/ML IJ SOLN
INTRAVENOUS | Status: DC | PRN
  Administered 2017-01-24: 40 ug/min via INTRAVENOUS

## 2017-01-24 MED ORDER — DOXAZOSIN MESYLATE 4 MG PO TABS
4.0000 mg | ORAL_TABLET | Freq: Every day | ORAL | Status: DC
  Administered 2017-01-24: 4 mg via ORAL
  Filled 2017-01-24: qty 1

## 2017-01-24 MED ORDER — MIDAZOLAM HCL 5 MG/5ML IJ SOLN
INTRAMUSCULAR | Status: DC | PRN
  Administered 2017-01-24: 2 mg via INTRAVENOUS

## 2017-01-24 MED ORDER — POLYETHYLENE GLYCOL 3350 17 G PO PACK: 17.0000 g | PACK | Freq: Every day | ORAL | Status: DC | PRN

## 2017-01-24 MED ORDER — ACETAMINOPHEN 500 MG PO TABS
1000.0000 mg | ORAL_TABLET | Freq: Four times a day (QID) | ORAL | Status: DC
  Administered 2017-01-24 – 2017-01-25 (×3): 1000 mg via ORAL
  Filled 2017-01-24 (×5): qty 2

## 2017-01-24 MED ORDER — SODIUM CHLORIDE 0.9 % IV SOLN: INTRAVENOUS | Status: DC

## 2017-01-24 MED ORDER — APIXABAN 2.5 MG PO TABS
2.5000 mg | ORAL_TABLET | Freq: Two times a day (BID) | ORAL | Status: DC
  Administered 2017-01-25: 2.5 mg via ORAL
  Filled 2017-01-24: qty 1

## 2017-01-24 MED ORDER — SODIUM CHLORIDE 0.9 % IR SOLN
Status: DC | PRN
  Administered 2017-01-24: 3000 mL

## 2017-01-24 MED ORDER — AMITRIPTYLINE HCL 50 MG PO TABS
50.0000 mg | ORAL_TABLET | Freq: Every day | ORAL | Status: DC
  Administered 2017-01-24: 50 mg via ORAL
  Filled 2017-01-24: qty 1

## 2017-01-24 MED ORDER — ZOLPIDEM TARTRATE 5 MG PO TABS: 5.0000 mg | ORAL_TABLET | Freq: Every evening | ORAL | Status: DC | PRN

## 2017-01-24 MED ORDER — PROPOFOL 10 MG/ML IV BOLUS
INTRAVENOUS | Status: DC | PRN
  Administered 2017-01-24: 100 mg via INTRAVENOUS
  Administered 2017-01-24: 20 mg via INTRAVENOUS
  Administered 2017-01-24: 10 mg via INTRAVENOUS

## 2017-01-24 MED ORDER — LOPERAMIDE HCL 2 MG PO TABS
4.0000 mg | ORAL_TABLET | Freq: Four times a day (QID) | ORAL | Status: DC | PRN
  Filled 2017-01-24: qty 2

## 2017-01-24 MED ORDER — METOCLOPRAMIDE HCL 5 MG PO TABS: 5.0000 mg | ORAL_TABLET | Freq: Three times a day (TID) | ORAL | Status: DC | PRN

## 2017-01-24 MED ORDER — SUGAMMADEX SODIUM 200 MG/2ML IV SOLN
INTRAVENOUS | Status: DC | PRN
  Administered 2017-01-24: 200 mg via INTRAVENOUS

## 2017-01-24 MED ORDER — MIDAZOLAM HCL 2 MG/2ML IJ SOLN
INTRAMUSCULAR | Status: AC
  Filled 2017-01-24: qty 2

## 2017-01-24 MED ORDER — FENTANYL CITRATE (PF) 250 MCG/5ML IJ SOLN
INTRAMUSCULAR | Status: DC | PRN
  Administered 2017-01-24: 50 ug via INTRAVENOUS

## 2017-01-24 MED ORDER — DIPHENHYDRAMINE HCL 12.5 MG/5ML PO ELIX: 12.5000 mg | ORAL_SOLUTION | ORAL | Status: DC | PRN

## 2017-01-24 MED ORDER — ONDANSETRON HCL 4 MG PO TABS: 4.0000 mg | ORAL_TABLET | Freq: Four times a day (QID) | ORAL | Status: DC | PRN

## 2017-01-24 MED ORDER — HYDROMORPHONE HCL 2 MG PO TABS
1.0000 mg | ORAL_TABLET | ORAL | Status: DC | PRN
  Administered 2017-01-24 – 2017-01-25 (×5): 2 mg via ORAL
  Filled 2017-01-24 (×6): qty 1

## 2017-01-24 MED ORDER — CLINDAMYCIN PHOSPHATE 600 MG/50ML IV SOLN
600.0000 mg | Freq: Once | INTRAVENOUS | Status: AC
  Administered 2017-01-24: 600 mg via INTRAVENOUS
  Filled 2017-01-24: qty 50

## 2017-01-24 MED ORDER — LIDOCAINE 2% (20 MG/ML) 5 ML SYRINGE
INTRAMUSCULAR | Status: DC | PRN
Start: 2017-01-24 — End: 2017-01-24
  Administered 2017-01-24: 60 mg via INTRAVENOUS

## 2017-01-24 MED ORDER — BISACODYL 5 MG PO TBEC: 5.0000 mg | DELAYED_RELEASE_TABLET | Freq: Every day | ORAL | Status: DC | PRN

## 2017-01-24 MED ORDER — FUROSEMIDE 40 MG PO TABS
40.0000 mg | ORAL_TABLET | Freq: Every day | ORAL | Status: DC
  Administered 2017-01-24 – 2017-01-25 (×2): 40 mg via ORAL
  Filled 2017-01-24 (×2): qty 1

## 2017-01-24 MED ORDER — FLEET ENEMA 7-19 GM/118ML RE ENEM: 1.0000 | ENEMA | Freq: Once | RECTAL | Status: DC | PRN

## 2017-01-24 MED ORDER — ACETAMINOPHEN 650 MG RE SUPP: 650.0000 mg | Freq: Four times a day (QID) | RECTAL | Status: DC | PRN

## 2017-01-24 MED ORDER — ASPIRIN EC 325 MG PO TBEC
325.0000 mg | DELAYED_RELEASE_TABLET | Freq: Every day | ORAL | Status: DC
  Administered 2017-01-24 – 2017-01-25 (×2): 325 mg via ORAL
  Filled 2017-01-24 (×3): qty 1

## 2017-01-24 MED ORDER — POVIDONE-IODINE 7.5 % EX SOLN: Freq: Once | CUTANEOUS | Status: DC

## 2017-01-24 MED ORDER — 0.9 % SODIUM CHLORIDE (POUR BTL) OPTIME
TOPICAL | Status: DC | PRN
  Administered 2017-01-24: 1000 mL

## 2017-01-24 MED ORDER — SODIUM CHLORIDE 0.9% FLUSH
INTRAVENOUS | Status: DC | PRN
  Administered 2017-01-24: 40 mL

## 2017-01-24 MED ORDER — LIDOCAINE 2% (20 MG/ML) 5 ML SYRINGE
INTRAMUSCULAR | Status: AC
  Filled 2017-01-24: qty 5

## 2017-01-24 MED ORDER — INSULIN ASPART 100 UNIT/ML ~~LOC~~ SOLN
0.0000 [IU] | Freq: Three times a day (TID) | SUBCUTANEOUS | Status: DC
  Administered 2017-01-24: 3 [IU] via SUBCUTANEOUS

## 2017-01-24 MED ORDER — ROCURONIUM BROMIDE 10 MG/ML (PF) SYRINGE
PREFILLED_SYRINGE | INTRAVENOUS | Status: DC | PRN
  Administered 2017-01-24: 60 mg via INTRAVENOUS

## 2017-01-24 MED ORDER — METOLAZONE 2.5 MG PO TABS
2.5000 mg | ORAL_TABLET | ORAL | Status: DC
Start: 2017-01-29 — End: 2017-01-25

## 2017-01-24 MED ORDER — BUPIVACAINE-EPINEPHRINE (PF) 0.5% -1:200000 IJ SOLN
INTRAMUSCULAR | Status: DC | PRN
  Administered 2017-01-24: 30 mL via PERINEURAL

## 2017-01-24 MED ORDER — ALUM & MAG HYDROXIDE-SIMETH 200-200-20 MG/5ML PO SUSP: 30.0000 mL | ORAL | Status: DC | PRN

## 2017-01-24 MED ORDER — ALBUTEROL SULFATE (2.5 MG/3ML) 0.083% IN NEBU: 3.0000 mL | INHALATION_SOLUTION | Freq: Four times a day (QID) | RESPIRATORY_TRACT | Status: DC | PRN

## 2017-01-24 MED ORDER — EPHEDRINE 5 MG/ML INJ
INTRAVENOUS | Status: AC
  Filled 2017-01-24: qty 20

## 2017-01-24 MED ORDER — METOCLOPRAMIDE HCL 5 MG/ML IJ SOLN: 5.0000 mg | Freq: Three times a day (TID) | INTRAMUSCULAR | Status: DC | PRN

## 2017-01-24 MED ORDER — BUPIVACAINE LIPOSOME 1.3 % IJ SUSP
20.0000 mL | INTRAMUSCULAR | Status: AC
  Administered 2017-01-24: 20 mL
  Filled 2017-01-24: qty 20

## 2017-01-24 MED ORDER — DEXAMETHASONE SODIUM PHOSPHATE 10 MG/ML IJ SOLN
INTRAMUSCULAR | Status: AC
  Filled 2017-01-24: qty 1

## 2017-01-24 MED ORDER — LACTATED RINGERS IV SOLN
INTRAVENOUS | Status: DC | PRN
  Administered 2017-01-24: 07:00:00 via INTRAVENOUS

## 2017-01-24 MED ORDER — ONDANSETRON HCL 4 MG/2ML IJ SOLN: 4.0000 mg | Freq: Once | INTRAMUSCULAR | Status: DC | PRN

## 2017-01-24 MED ORDER — CLINDAMYCIN PHOSPHATE 600 MG/50ML IV SOLN
600.0000 mg | Freq: Four times a day (QID) | INTRAVENOUS | Status: AC
  Administered 2017-01-24 (×2): 600 mg via INTRAVENOUS
  Filled 2017-01-24 (×2): qty 50

## 2017-01-24 MED ORDER — METFORMIN HCL ER 500 MG PO TB24
1000.0000 mg | ORAL_TABLET | Freq: Two times a day (BID) | ORAL | Status: DC
  Administered 2017-01-24 – 2017-01-25 (×2): 1000 mg via ORAL
  Filled 2017-01-24 (×2): qty 2

## 2017-01-24 MED ORDER — TIZANIDINE HCL 4 MG PO TABS: 8.0000 mg | ORAL_TABLET | Freq: Every day | ORAL | Status: DC

## 2017-01-24 MED ORDER — TRAMADOL HCL 50 MG PO TABS: 100.0000 mg | ORAL_TABLET | Freq: Four times a day (QID) | ORAL | Status: DC | PRN

## 2017-01-24 MED ORDER — ROCURONIUM BROMIDE 10 MG/ML (PF) SYRINGE
PREFILLED_SYRINGE | INTRAVENOUS | Status: AC
  Filled 2017-01-24: qty 5

## 2017-01-24 MED ORDER — ZOLPIDEM TARTRATE 5 MG PO TABS
10.0000 mg | ORAL_TABLET | Freq: Every day | ORAL | Status: DC
  Administered 2017-01-24: 10 mg via ORAL
  Filled 2017-01-24: qty 2

## 2017-01-24 MED ORDER — ACETAMINOPHEN 325 MG PO TABS: 650.0000 mg | ORAL_TABLET | Freq: Four times a day (QID) | ORAL | Status: DC | PRN

## 2017-01-24 MED ORDER — EPHEDRINE SULFATE-NACL 50-0.9 MG/10ML-% IV SOSY
PREFILLED_SYRINGE | INTRAVENOUS | Status: DC | PRN
  Administered 2017-01-24: 15 mg via INTRAVENOUS
  Administered 2017-01-24: 10 mg via INTRAVENOUS
  Administered 2017-01-24: 20 mg via INTRAVENOUS
  Administered 2017-01-24: 10 mg via INTRAVENOUS

## 2017-01-24 MED ORDER — ONDANSETRON HCL 4 MG/2ML IJ SOLN: 4.0000 mg | Freq: Four times a day (QID) | INTRAMUSCULAR | Status: DC | PRN

## 2017-01-24 MED ORDER — DOCUSATE SODIUM 100 MG PO CAPS
100.0000 mg | ORAL_CAPSULE | Freq: Two times a day (BID) | ORAL | Status: DC
  Filled 2017-01-24 (×2): qty 1

## 2017-01-24 MED ORDER — GLYCOPYRROLATE 0.2 MG/ML IJ SOLN
INTRAMUSCULAR | Status: DC | PRN
  Administered 2017-01-24 (×3): .2 mg via INTRAVENOUS

## 2017-01-24 MED ORDER — PHENOL 1.4 % MT LIQD: 1.0000 | OROMUCOSAL | Status: DC | PRN

## 2017-01-24 MED ORDER — TRANEXAMIC ACID 1000 MG/10ML IV SOLN
1000.0000 mg | INTRAVENOUS | Status: AC
  Administered 2017-01-24: 1000 mg via INTRAVENOUS
  Filled 2017-01-24: qty 10

## 2017-01-24 MED ORDER — MENTHOL 3 MG MT LOZG: 1.0000 | LOZENGE | OROMUCOSAL | Status: DC | PRN

## 2017-01-24 MED ORDER — HYDROMORPHONE HCL 1 MG/ML IJ SOLN: 0.5000 mg | INTRAMUSCULAR | Status: DC | PRN

## 2017-01-24 MED ORDER — AMIODARONE HCL 100 MG PO TABS
200.0000 mg | ORAL_TABLET | Freq: Every day | ORAL | Status: DC
  Administered 2017-01-25: 200 mg via ORAL
  Filled 2017-01-24 (×2): qty 2

## 2017-01-24 MED ORDER — FENOFIBRATE 160 MG PO TABS
160.0000 mg | ORAL_TABLET | Freq: Every day | ORAL | Status: DC
  Administered 2017-01-24: 160 mg via ORAL
  Filled 2017-01-24: qty 1

## 2017-01-24 MED ORDER — LEVOTHYROXINE SODIUM 112 MCG PO TABS
112.0000 ug | ORAL_TABLET | Freq: Every day | ORAL | Status: DC
  Administered 2017-01-25: 112 ug via ORAL
  Filled 2017-01-24: qty 1

## 2017-01-24 MED ORDER — ONDANSETRON HCL 4 MG/2ML IJ SOLN
INTRAMUSCULAR | Status: DC | PRN
  Administered 2017-01-24: 4 mg via INTRAVENOUS

## 2017-01-24 SURGICAL SUPPLY — 71 items
BIT DRILL 5/64X5 DISP (BIT) ×3 IMPLANT
BLADE SAW SAG 73X25 THK (BLADE) ×2
BLADE SAW SGTL 73X25 THK (BLADE) ×1 IMPLANT
BLADE SURG 15 STRL LF DISP TIS (BLADE) ×1 IMPLANT
BLADE SURG 15 STRL SS (BLADE) ×2
CAP SHOULDER TOTAL 2 ×3 IMPLANT
CEMENT BONE DEPUY (Cement) ×3 IMPLANT
CHLORAPREP W/TINT 26ML (MISCELLANEOUS) ×3 IMPLANT
CLOSURE STERI-STRIP 1/2X4 (GAUZE/BANDAGES/DRESSINGS) ×1
CLOSURE WOUND 1/2 X4 (GAUZE/BANDAGES/DRESSINGS) ×1
CLSR STERI-STRIP ANTIMIC 1/2X4 (GAUZE/BANDAGES/DRESSINGS) ×2 IMPLANT
COVER SURGICAL LIGHT HANDLE (MISCELLANEOUS) ×3 IMPLANT
DRAPE INCISE IOBAN 66X45 STRL (DRAPES) ×3 IMPLANT
DRAPE ORTHO SPLIT 77X108 STRL (DRAPES) ×4
DRAPE SURG 17X23 STRL (DRAPES) ×3 IMPLANT
DRAPE SURG ORHT 6 SPLT 77X108 (DRAPES) ×2 IMPLANT
DRAPE U-SHAPE 47X51 STRL (DRAPES) ×3 IMPLANT
DRSG AQUACEL AG ADV 3.5X 6 (GAUZE/BANDAGES/DRESSINGS) ×3 IMPLANT
DRSG AQUACEL AG ADV 3.5X10 (GAUZE/BANDAGES/DRESSINGS) ×3 IMPLANT
ELECT BLADE 4.0 EZ CLEAN MEGAD (MISCELLANEOUS) ×3
ELECT REM PT RETURN 9FT ADLT (ELECTROSURGICAL) ×3
ELECTRODE BLDE 4.0 EZ CLN MEGD (MISCELLANEOUS) ×1 IMPLANT
ELECTRODE REM PT RTRN 9FT ADLT (ELECTROSURGICAL) ×1 IMPLANT
GLOVE BIO SURGEON STRL SZ7 (GLOVE) ×3 IMPLANT
GLOVE BIO SURGEON STRL SZ7.5 (GLOVE) ×3 IMPLANT
GLOVE BIOGEL PI IND STRL 7.0 (GLOVE) ×1 IMPLANT
GLOVE BIOGEL PI IND STRL 8 (GLOVE) ×1 IMPLANT
GLOVE BIOGEL PI INDICATOR 7.0 (GLOVE) ×2
GLOVE BIOGEL PI INDICATOR 8 (GLOVE) ×2
GOWN STRL REUS W/ TWL LRG LVL3 (GOWN DISPOSABLE) ×1 IMPLANT
GOWN STRL REUS W/ TWL XL LVL3 (GOWN DISPOSABLE) ×1 IMPLANT
GOWN STRL REUS W/TWL LRG LVL3 (GOWN DISPOSABLE) ×2
GOWN STRL REUS W/TWL XL LVL3 (GOWN DISPOSABLE) ×2
GUIDEWIRE GLENOID 2.5X220 (WIRE) IMPLANT
HANDPIECE INTERPULSE COAX TIP (DISPOSABLE) ×2
HEMOSTAT SURGICEL 2X14 (HEMOSTASIS) ×3 IMPLANT
HOOD PEEL AWAY FLYTE STAYCOOL (MISCELLANEOUS) ×6 IMPLANT
KIT BASIN OR (CUSTOM PROCEDURE TRAY) ×3 IMPLANT
KIT ROOM TURNOVER OR (KITS) ×3 IMPLANT
MANIFOLD NEPTUNE II (INSTRUMENTS) ×3 IMPLANT
NEEDLE HYPO 25GX1X1/2 BEV (NEEDLE) ×3 IMPLANT
NEEDLE MAYO TROCAR (NEEDLE) ×3 IMPLANT
NEEDLE SPNL 18GX3.5 QUINCKE PK (NEEDLE) ×6 IMPLANT
NS IRRIG 1000ML POUR BTL (IV SOLUTION) ×3 IMPLANT
PACK SHOULDER (CUSTOM PROCEDURE TRAY) ×3 IMPLANT
PAD ARMBOARD 7.5X6 YLW CONV (MISCELLANEOUS) ×6 IMPLANT
RESTRAINT HEAD UNIVERSAL NS (MISCELLANEOUS) ×3 IMPLANT
RETRIEVER SUT HEWSON (MISCELLANEOUS) ×3 IMPLANT
SET HNDPC FAN SPRY TIP SCT (DISPOSABLE) ×1 IMPLANT
SLING ARM FOAM STRAP LRG (SOFTGOODS) ×3 IMPLANT
SLING ARM FOAM STRAP MED (SOFTGOODS) IMPLANT
SMARTMIX MINI TOWER (MISCELLANEOUS) ×3
SPONGE LAP 18X18 X RAY DECT (DISPOSABLE) ×3 IMPLANT
SPONGE LAP 4X18 X RAY DECT (DISPOSABLE) IMPLANT
STRIP CLOSURE SKIN 1/2X4 (GAUZE/BANDAGES/DRESSINGS) ×2 IMPLANT
SUCTION FRAZIER HANDLE 10FR (MISCELLANEOUS) ×2
SUCTION TUBE FRAZIER 10FR DISP (MISCELLANEOUS) ×1 IMPLANT
SUPPORT WRAP ARM LG (MISCELLANEOUS) ×3 IMPLANT
SUT ETHIBOND NAB CT1 #1 30IN (SUTURE) ×9 IMPLANT
SUT FIBERWIRE #2 38 T-5 BLUE (SUTURE)
SUT MNCRL AB 4-0 PS2 18 (SUTURE) ×3 IMPLANT
SUT VIC AB 2-0 CT1 27 (SUTURE) ×2
SUT VIC AB 2-0 CT1 TAPERPNT 27 (SUTURE) ×1 IMPLANT
SUTURE FIBERWR #2 38 T-5 BLUE (SUTURE) IMPLANT
SYR 30ML LL (SYRINGE) ×6 IMPLANT
SYR CONTROL 10ML LL (SYRINGE) IMPLANT
TAPE LABRALWHITE 1.5X36 (TAPE) ×3 IMPLANT
TAPE SUT LABRALTAP WHT/BLK (SUTURE) ×3 IMPLANT
TOWEL OR 17X24 6PK STRL BLUE (TOWEL DISPOSABLE) ×3 IMPLANT
TOWEL OR 17X26 10 PK STRL BLUE (TOWEL DISPOSABLE) ×3 IMPLANT
TOWER SMARTMIX MINI (MISCELLANEOUS) ×1 IMPLANT

## 2017-01-24 NOTE — Op Note (Addendum)
Procedure(s): LEFT TOTAL SHOULDER ARTHROPLASTY Procedure Note  Maureen [name] female 66 y.o. 01/24/2017  Procedure(s) and Anesthesia Type:    * LEFT TOTAL SHOULDER ARTHROPLASTY - Choice       LEFT LONG HEAD BICEPS TENDOSIS  Surgeon(s) and Role:    Tania Ade, MD - Primary   Indications:  66 y.o. female  With endstage left shoulder arthritis. Pain and dysfunction interfered with quality of life and nonoperative treatment with activity modification, NSAIDS and injections failed.     Surgeon: Nita Sells   Assistants: Jeanmarie Hubert PA-C [facility] was present and scrubbed throughout the procedure and was essential in positioning, retraction, exposure, and closure)  Anesthesia: General endotracheal anesthesia with preoperative interscalene block given by the attending anesthesiologist    Procedure Detail  LEFT TOTAL SHOULDER ARTHROPLASTY  Findings: Tornier flex anatomic press-fit size 4 stem with a 46 high offset head, cemented size 35 small Cortiloc glenoid.   A lesser tuberosity osteotomy was performed and repaired at the conclusion of the procedure.  Estimated Blood Loss:  200 mL         Drains: None   Blood Given: none          Specimens: none        Complications:  * No complications entered in OR log *         Disposition: PACU - hemodynamically stable.         Condition: stable    Procedure:   The patient was identified in the preoperative holding area where I personally marked the operative extremity after verifying with the patient and consent. She  was taken to the operating room where She was transferred to the   operative table.  The patient received an interscalene block in   the holding area by the attending anesthesiologist.  General anesthesia was induced   in the operating room without complication.  The patient did receive IV  Ancef prior to the commencement of the procedure.  The patient was   placed in the beach-chair  position with the back raised about 30   degrees.  The nonoperative extremity and head and neck were carefully   positioned and padded protecting against neurovascular compromise.  The   left upper extremity was then prepped and draped in the standard sterile   fashion.    The appropriate operative time-out was performed with   Anesthesia, the perioperative staff, as well as myself and we all agreed   that the left side was the correct operative site. The patient received 1 g IV tranexamic acid at the start of the case around time of the incision.  An approximately   10 cm incision was made from the tip of the coracoid to the center point of the   humerus at the level of the axilla.  Dissection was carried down sharply   through subcutaneous tissues and cephalic vein was identified and taken   laterally with the deltoid.  The pectoralis major was taken medially.  The   upper 1 cm of the pectoralis major was released from its attachment on   the humerus.  The clavipectoral fascia was incised just lateral to the   conjoined tendon.  This incision was carried up to but not into the   coracoacromial ligament.  Digital palpation was used to prove   integrity of the axillary nerve which was protected throughout the   procedure.  Musculocutaneous nerve was not palpated in the operative   field.  Conjoined tendon was then retracted gently medially and the   deltoid laterally.  Anterior circumflex humeral vessels were clamped and   coagulated.  The soft tissues overlying the biceps was incised and this   incision was carried across the transverse humeral ligament to the base   of the coracoid.  The biceps was noted to be severely degenerated. It was released from the superior labrum. The biceps was then tenodesed to the soft tissue just above   pectoralis major and the remaining portion of the biceps superiorly was   excised.  An osteotomy was performed at the lesser tuberosity.  The capsule was then    released all the way down to the 6 o'clock position of the humeral head.   The humeral head was then delivered with simultaneous adduction,   extension and external rotation.  All humeral osteophytes were removed   and the anatomic neck of the humerus was marked and cut free hand at   approximately 25 degrees retroversion within about 3 mm of the cuff   reflection posteriorly.  The head size was estimated to be a 46 medium   offset.  At that point, the humeral head was retracted posteriorly with   a Fukuda retractor.   Remaining portion of the capsule was released at the base of the   coracoid.  The remaining biceps anchor and the entire anterior-inferior   labrum was excised.  The posterior labrum was also excised but the   posterior capsule was not released.  The guidepin was placed bicortically with non elevated guide.  The reamer was used to ream to concentric bone with punctate bleeding.  This gave an excellent concentric surface.  The center hole was then drilled for an anchor peg glenoid followed by the three peripheral holes and none of the holes   exited the glenoid wall.  I then pulse irrigated these holes and dried   them with Surgicel.  The three peripheral holes were then   pressurized cemented and the anchor peg glenoid was placed and impacted   with an excellent fit.  The glenoid was a 35s component.  The proximal humerus was then again exposed taking care not to displace the glenoid.    The entry awl was used followed by sounding reamers and then sequentially broached from size 1 to 4. This was then left in place and the calcar planer was used. Trial head was placed with a 46.  With the trial implantation of the component,  there was approximately 50% posterior translation with immediate snap back to the   anatomic position.  With forward elevation, there was no tendency   towards posterior subluxation.   The trial was removed and the final implant was prepared on a back table.   The trial was removed and the final implant was prepared on a back table.   3 small holes were drilled on the medial side of the lesser tuberosity osteotomy, through which 2 labral tapes were passed. The implant was then placed through the loop of the 2 labral tapes and impacted with an excellent press-fit. This achieved excellent anatomic reconstruction of the proximal humerus.  The joint was then copiously irrigated with pulse lavage.  The subscapularis and   lesser tuberosity osteotomy were then repaired using the 2 labral tapes previously passed in a double row fashion with horizontal mattress sutures medially brought over through bone tunnels tied over a bone bridge laterally.   One #1 Ethibond was placed at  the rotator interval just above   the lesser tuberosity. Copious irrigation was used. Throughout the case a mixture of 20 mL liposomal bupivacaine and 40 mL normal saline was used to infiltrate the deep capsular tissue, bony surfaces and subcutaneous tissue. Skin was closed with 2-0 Vicryl sutures in the deep dermal layer and 4-0 Monocryl in a subcuticular  running fashion.  Sterile dressings were then applied including Aquacel.  The patient was placed in a sling and allowed to awaken from general anesthesia and taken to the recovery room in stable condition.      POSTOPERATIVE PLAN:  Early passive range of motion will be allowed with the goal of 0 degrees external rotation and 90 degrees forward elevation.  No internal rotation at this time.  No active motion of the arm until the lesser tuberosity heals.  The patient will likely be kept in the hospital for 1-2 days and then discharged home.

## 2017-01-24 NOTE — Anesthesia Postprocedure Evaluation (Signed)
Anesthesia Post Note  Patient: Maureen [name]  Procedure(s) Performed: LEFT TOTAL SHOULDER ARTHROPLASTY (Left Shoulder)     Patient location during evaluation: PACU Anesthesia Type: General Level of consciousness: awake and alert Pain management: pain level controlled Vital Signs Assessment: post-procedure vital signs reviewed and stable Respiratory status: spontaneous breathing, nonlabored ventilation, respiratory function stable and patient connected to nasal cannula oxygen Cardiovascular status: blood pressure returned to baseline and stable Postop Assessment: no apparent nausea or vomiting Anesthetic complications: no    Last Vitals:  Vitals:   01/24/17 1045 01/24/17 1115  BP: (!) 131/51 (!) 122/48  Pulse: 72 66  Resp: 18 17  Temp:    SpO2: 94% 97%    Last Pain:  Vitals:   01/24/17 1115  TempSrc:   PainSc: [place]

## 2017-01-24 NOTE — H&P (Signed)
Maureen [name] is an 66 y.o. female.   Chief Complaint: L shoulder pain and dysfunction  HPI: Endstage L shoulder arthritis with significant pain and dysfunction, failed conservative measures.  Pain interferes with sleep and quality of life.   Past Medical History:  Diagnosis Date  . Anemia   . Arthritis   . Asthma    sometimes with bad cold  . Atrial fibrillation ([place])   . Atrial flutter ([place])   . Breast cancer of upper-outer quadrant of left female breast ([place]) 01/06/2016  . Bronchitis    with a cold  . CHF (congestive heart failure) ([place])   . Chronic kidney disease   . Diabetes ([place])    dx 2006  . Edema   . Family history of adverse reaction to anesthesia    " my father was hard to put under."  . Fatty liver   . Fibromyalgia   . GERD (gastroesophageal reflux disease)   . Hepatitis A   . History of blood transfusion reaction   . Hypertension   . Hypothyroidism   . IBS (irritable bowel syndrome)   . Insomnia   . Osteoarthritis   . Sleep apnea   . Vitamin B deficiency   . Wears glasses     Past Surgical History:  Procedure Laterality Date  . ABDOMINAL HYSTERECTOMY    . BACK SURGERY    . BREAST SURGERY     left breast biopsy  . CARDIAC CATHETERIZATION     05/07/14 Cardiac cath: Normal coronaries, NL LVF, EF 65% ([facility])  . CARPAL TUNNEL RELEASE    . CESAREAN SECTION    . CHOLECYSTECTOMY    . DILATION AND CURETTAGE OF UTERUS    . EVACUATION BREAST HEMATOMA Left 04/02/2016   Procedure: EVACUATION HEMATOMA LEFT BREAST;  Surgeon: [name] III, MD;  Location: [facility];  Service: General;  Laterality: Left;  . EYE SURGERY     strabismus and B/L cataracts  . HERNIA REPAIR    . JOINT REPLACEMENT     both knees replaced  . LUMBAR DISC SURGERY    . MASTECTOMY    . MASTECTOMY W/ SENTINEL NODE BIOPSY Bilateral 03/21/2016   Procedure: LEFT MASTECTOMY WITH LEFT SENTINEL LYMPH NODE BIOPSY, RIGHT PROPHYLACTIC MASTECTOMY;  Surgeon: [name] III, MD;  Location: [facility];  Service: General;  Laterality: Bilateral;  . TONSILLECTOMY    . TRANSTHORACIC ECHOCARDIOGRAM     05/06/14 [facility]): Mild concentric LVH, LVEF prob low normal (diff to eval due to rapid AF), mild MR.    Family History  Problem Relation Age of Onset  . Breast cancer Mother   . Colon cancer Maternal Grandmother    Social History:  reports that she quit smoking about 10 years ago. Her smoking use included Cigarettes. She has never used smokeless tobacco. She reports that she does not drink alcohol or use drugs.  Allergies:  Allergies  Allergen Reactions  . Cephalexin Hives  . Penicillin G Hives and Other (See Comments)    PATIENT DOESN'T REMEMBER [DOES HAVE ALLERGIC RESPONSE TO CEPHALEXIN > HIVES]  Has patient had a PCN reaction causing immediate rash, facial/tongue/throat swelling, SOB or lightheadedness with hypotension: unknown Has patient had a PCN reaction causing severe rash involving mucus membranes or skin necrosis: unknown Has patient had a PCN reaction that required hospitalization: unknown Has patient had a PCN reaction occurring within the last 10 years: unknown If all of the above answers are "NO", th  .  Spironolactone Other (See Comments)    UNSPECIFIED REACTION  "Cannot recall reaction--but knows she IS allergic"  . Codeine Other (See Comments)    Heartburn   . Doxycycline Itching  . Hydrocodone Itching  . Percodan [Oxycodone-Aspirin] Itching    Medications Prior to Admission  Medication Sig Dispense Refill  . amiodarone (PACERONE) 200 MG tablet TAKE 1 TABLET ONCE A DAY IN THE MORNING.    Marland Kitchen amitriptyline (ELAVIL) 50 MG tablet TAKE 1 TABLET BY MOUTH AT BEDTIME    . apixaban (ELIQUIS) 2.5 MG TABS tablet Take 2.5 mg by mouth 2 (two) times daily.    . carvedilol (COREG) 6.25 MG tablet TAKE 1 TABLET BY MOUTH TWICE A DAY    . cholecalciferol (VITAMIN D) 1000 units tablet Take 1,000 Units by mouth 2 (two) times daily.    . cyanocobalamin (,VITAMIN  B-12,) 1000 MCG/ML injection Inject 1,000 mcg into the muscle every 30 (thirty) days.    Marland Kitchen doxazosin (CARDURA) 4 MG tablet Take 4 mg by mouth at bedtime.     . fenofibrate 160 MG tablet TAKE 1 TABLET BY MOUTH AT BEDTIME    . furosemide (LASIX) 40 MG tablet Take 40 mg by mouth daily.    Marland Kitchen loperamide (IMODIUM A-D) 2 MG tablet Take 4 mg by mouth 4 (four) times daily as needed for diarrhea or loose stools.    . metFORMIN (GLUCOPHAGE-XR) 500 MG 24 hr tablet Take 1,000 mg by mouth 2 (two) times daily.    . metolazone (ZAROXOLYN) 2.5 MG tablet Take 2.5 mg by mouth every Tuesday.    Marland Kitchen SYNTHROID 112 MCG tablet Take 112 mcg by mouth daily at 2 am. (0400)    . tiZANidine (ZANAFLEX) 4 MG tablet Take 8 mg by mouth at bedtime.    . traMADol (ULTRAM) 50 MG tablet Take 100 mg by mouth every 6 (six) hours as needed (for pain.).    Marland Kitchen zolpidem (AMBIEN) 10 MG tablet TAKE 1 TABLET BY MOUTH AT BEDTIME    . albuterol (PROVENTIL HFA;VENTOLIN HFA) 108 (90 Base) MCG/ACT inhaler Inhale 2 puffs into the lungs every 6 (six) hours as needed for wheezing or shortness of breath.    . clindamycin (CLEOCIN) 300 MG capsule Take 600 mg by mouth 2 (two) times daily as needed (for dental procedures).      Results for orders placed or performed during the hospital encounter of 01/24/17 (from the past 48 hour(s))  Glucose, capillary     Status: None   Collection Time: 01/24/17  6:19 AM  Result Value Ref Range   Glucose-Capillary 91 65 - 99 mg/dL   Comment 1 Notify RN    No results found.  Review of Systems  All other systems reviewed and are negative.   Blood pressure (!) 133/42, pulse (!) 58, temperature (!) 97.5 F (36.4 C), resp. rate 20, weight 126.3 kg (278 lb 8 oz), SpO2 100 %. Physical Exam  Constitutional: She is oriented to person, place, and time. She appears well-developed and well-nourished.  HENT:  Head: Atraumatic.  Eyes: EOM are normal.  Cardiovascular: Intact distal pulses.   Respiratory: Effort normal.   Musculoskeletal:  L shoulder pain with limited ROM. NVID  Neurological: She is alert and oriented to person, place, and time.  Skin: Skin is warm and dry.  Psychiatric: She has a normal mood and affect.     Assessment/Plan Endstage L shoulder arthritis with significant pain and dysfunction, failed conservative measures.  Pain interferes with sleep and quality  of life. Plan Left TSA Risks / benefits of surgery discussed Consent on chart  NPO for OR Preop antibiotics   Nita Sells, MD 01/24/2017, 7:13 AM

## 2017-01-24 NOTE — Anesthesia Procedure Notes (Signed)
Procedure Name: Intubation Date/Time: 01/24/2017 7:42 AM Performed by: Freddie Breech Pre-anesthesia Checklist: Patient identified, Emergency Drugs available, Suction available and Patient being monitored Patient Re-evaluated:Patient Re-evaluated prior to induction Oxygen Delivery Method: Circle System Utilized Preoxygenation: Pre-oxygenation with 100% oxygen Induction Type: IV induction Ventilation: Mask ventilation without difficulty Laryngoscope Size: Mac and 3 Grade View: Grade I Tube type: Oral Tube size: 7.5 mm Number of attempts: 1 Airway Equipment and Method: Stylet and Oral airway Placement Confirmation: ETT inserted through vocal cords under direct vision,  positive ETCO2 and breath sounds checked- equal and bilateral Secured at: 21 cm Tube secured with: Tape Dental Injury: Teeth and Oropharynx as per pre-operative assessment

## 2017-01-24 NOTE — Transfer of Care (Signed)
Immediate Anesthesia Transfer of Care Note  Patient: Maureen [name]  Procedure(s) Performed: LEFT TOTAL SHOULDER ARTHROPLASTY (Left Shoulder)  Patient Location: PACU  Anesthesia Type:GA combined with regional for post-op pain  Level of Consciousness: drowsy and patient cooperative  Airway & Oxygen Therapy: Patient Spontanous Breathing and Patient connected to face mask oxygen  Post-op Assessment: Report given to RN and Post -op Vital signs reviewed and stable  Post vital signs: Reviewed and stable  Last Vitals:  Vitals:   01/24/17 0623 01/24/17 0946  BP: (!) 133/42   Pulse:    Resp:    Temp:  36.5 C  SpO2:      Last Pain:  Vitals:   01/24/17 0622  TempSrc:   PainSc: 2          Complications: No apparent anesthesia complications

## 2017-01-24 NOTE — Anesthesia Procedure Notes (Signed)
Anesthesia Regional Block: Interscalene brachial plexus block   Pre-Anesthetic Checklist: ,, timeout performed, Correct Patient, Correct Site, Correct Laterality, Correct Procedure, Correct Position, site marked, Risks and benefits discussed,  Surgical consent,  Pre-op evaluation,  At surgeon's request and post-op pain management  Laterality: Left  Prep: chloraprep       Needles:  Injection technique: Single-shot  Needle Type: Echogenic Needle     Needle Length: 9cm  Needle Gauge: 21     Additional Needles:   Narrative:  Start time: 01/24/2017 7:11 AM End time: 01/24/2017 7:14 AM Injection made incrementally with aspirations every 5 mL.  Performed by: Personally  Anesthesiologist: Renold Don E  Additional Notes: No pain on injection. No increased resistance to injection. Injection made in 5cc increments. Good needle visualization. Patient tolerated the procedure well.

## 2017-01-24 NOTE — Progress Notes (Signed)
Report given to maryann rn as caregiver 

## 2017-01-25 ENCOUNTER — Encounter (HOSPITAL_COMMUNITY): Payer: Self-pay | Admitting: Orthopedic Surgery

## 2017-01-25 LAB — GLUCOSE, CAPILLARY
GLUCOSE-CAPILLARY: 108 mg/dL — AB (ref 65–99)
GLUCOSE-CAPILLARY: 87 mg/dL (ref 65–99)

## 2017-01-25 LAB — CBC
HEMATOCRIT: 26.8 % — AB (ref 36.0–46.0)
HEMOGLOBIN: 8.5 g/dL — AB (ref 12.0–15.0)
MCH: 28 pg (ref 26.0–34.0)
MCHC: 31.7 g/dL (ref 30.0–36.0)
MCV: 88.2 fL (ref 78.0–100.0)
Platelets: 326 10*3/uL (ref 150–400)
RBC: 3.04 MIL/uL — AB (ref 3.87–5.11)
RDW: 15.5 % (ref 11.5–15.5)
WBC: 8.1 10*3/uL (ref 4.0–10.5)

## 2017-01-25 LAB — BASIC METABOLIC PANEL
ANION GAP: 8 (ref 5–15)
BUN: 42 mg/dL — ABNORMAL HIGH (ref 6–20)
CHLORIDE: 96 mmol/L — AB (ref 101–111)
CO2: 29 mmol/L (ref 22–32)
Calcium: 8.7 mg/dL — ABNORMAL LOW (ref 8.9–10.3)
Creatinine, Ser: 2.95 mg/dL — ABNORMAL HIGH (ref 0.44–1.00)
GFR calc non Af Amer: 15 mL/min — ABNORMAL LOW (ref >60)
GFR, EST AFRICAN AMERICAN: 18 mL/min — AB (ref >60)
Glucose, Bld: 119 mg/dL — ABNORMAL HIGH (ref 65–99)
POTASSIUM: 3.8 mmol/L (ref 3.5–5.1)
SODIUM: 133 mmol/L — AB (ref 135–145)

## 2017-01-25 MED ORDER — HYDROMORPHONE HCL 2 MG PO TABS: 2.0000 mg | ORAL_TABLET | ORAL | 0 refills | Status: AC | PRN

## 2017-01-25 MED ORDER — HYDROMORPHONE HCL 2 MG PO TABS: 2.0000 mg | ORAL_TABLET | ORAL | 0 refills | Status: DC | PRN

## 2017-01-25 NOTE — Evaluation (Signed)
Occupational Therapy Evaluation and Discharge Patient Details Name: Maureen [name] MRN: [id number] DOB: 09-23-1950 Today's Date: 01/25/2017    History of Present Illness S/P shoulder replacement, left   Clinical Impression   This 66 yo female admitted and underwent above presents to acute OT with all education completed, we will D/C from acute OT.    Follow Up Recommendations   (follow up per MD)    Equipment Recommendations  None recommended by OT       Precautions / Restrictions Precautions Precautions: Shoulder Shoulder Interventions: Off for dressing/bathing/exercises Precaution Booklet Issued: Yes (comment) Precaution Comments: only movement elbow, wrist, and hand; no shoulder ROM (had to keep reminding pt to not move her shoulder Restrictions Weight Bearing Restrictions: Yes LUE Weight Bearing: Non weight bearing      Mobility Bed Mobility                Pt up in recliner upon my arrival  Transfers  Mod I --increased time                        ADL either performed or assessed with clinical judgement   ADL                                         General ADL Comments: Independent to bathroom to toilet, intermittent A for clothing for toileting                  Pertinent Vitals/Pain Pain Assessment: 0-10 Pain Score: 2  Pain Location: left shoulder with ADLs Pain Descriptors / Indicators: Aching;Sore Pain Intervention(s): Limited activity within patient's tolerance;Monitored during session;Repositioned     Hand Dominance Right   Extremity/Trunk Assessment Upper Extremity Assessment Upper Extremity Assessment: LUE deficits/detail LUE Deficits / Details: shoulder sx this admission, elbow and distally WNL LUE Coordination: decreased gross motor           Communication Communication Communication: No difficulties   Cognition Arousal/Alertness: Awake/alert Behavior During Therapy: WFL for tasks  assessed/performed Overall Cognitive Status: Within Functional Limits for tasks assessed                                        Exercises Other Exercises Other Exercises: Pt completed 10 reps of elbow, forearm, wrist, and hand exercies. with instructions to do these 5 times a day 10 reps each.    Shoulder Instructions Shoulder Instructions Donning/doffing shirt without moving shoulder: Modified independent Method for sponge bathing under operated UE: Modified independent Donning/doffing sling/immobilizer: Modified independent Correct positioning of sling/immobilizer: Modified independent Pendulum exercises (written home exercise program):  (NA) ROM for elbow, wrist and digits of operated UE: Independent Sling wearing schedule (on at all times/off for ADL's): Independent Proper positioning of operated UE when showering: Independent Dressing change:  (NA) Positioning of UE while sleeping: [place] expects to be discharged to:: Private residence Living Arrangements: Children Available Help at Discharge: Family;Available PRN/intermittently Type of Home: House                       Home Equipment: Other (comment) (bidet, lift chair)          Prior Functioning/Environment Level of Independence: Independent  OT Problem List: Decreased range of motion;Impaired UE functional use;Pain         OT Goals(Current goals can be found in the care plan section) Acute Rehab OT Goals Patient Stated Goal: home today  OT Frequency:                AM-PAC PT "6 Clicks" Daily Activity     Outcome Measure Help from another person eating meals?: None Help from another person taking care of personal grooming?: None Help from another person toileting, which includes using toliet, bedpan, or urinal?: A Little Help from another person bathing (including washing, rinsing, drying)?: A Little Help from another person  to put on and taking off regular upper body clothing?: None Help from another person to put on and taking off regular lower body clothing?: A Lot 6 Click Score: 20   End of Session Equipment Utilized During Treatment:  (sling for LUE) Nurse Communication: Mobility status  Activity Tolerance: Patient tolerated treatment well Patient left: in chair;with call bell/phone within reach  OT Visit Diagnosis: Pain Pain - Right/Left: Left Pain - part of body: Shoulder                Time: 9381-8299 OT Time Calculation (min): 35 min Charges:  OT General Charges $OT Visit: 1 Visit OT Evaluation $OT Eval Moderate Complexity: 1 Mod OT Treatments $Self Care/Home Management : 8-22 mins [place], OTR/L [phone] 01/25/2017

## 2017-01-25 NOTE — Discharge Summary (Signed)
Patient ID: Maureen [name] MRN: [id number] DOB/AGE: 10-24-1950 66 y.o.  Admit date: 01/24/2017 Discharge date: 01/25/2017  Admission Diagnoses:  Active Problems:   S/P shoulder replacement, left   Discharge Diagnoses:  Same  Past Medical History:  Diagnosis Date  . Anemia   . Arthritis   . Asthma    sometimes with bad cold  . Atrial fibrillation ([place])   . Atrial flutter ([place])   . Breast cancer of upper-outer quadrant of left female breast ([place]) 01/06/2016  . Bronchitis    with a cold  . CHF (congestive heart failure) ([place])   . Chronic kidney disease   . Diabetes ([place])    dx 2006  . Edema   . Family history of adverse reaction to anesthesia    " my father was hard to put under."  . Fatty liver   . Fibromyalgia   . GERD (gastroesophageal reflux disease)   . Hepatitis A   . History of blood transfusion reaction   . Hypertension   . Hypothyroidism   . IBS (irritable bowel syndrome)   . Insomnia   . Osteoarthritis   . Sleep apnea   . Vitamin B deficiency   . Wears glasses     Surgeries: Procedure(s): LEFT TOTAL SHOULDER ARTHROPLASTY on 01/24/2017   Consultants:   Discharged Condition: Improved  Hospital Course: Maureen [name] is an 66 y.o. female who was admitted 01/24/2017 for operative treatment of L shoulder end stage OA. Patient has severe unremitting pain that affects sleep, daily activities, and work/hobbies. After pre-op clearance the patient was taken to the operating room on 01/24/2017 and underwent  Procedure(s): LEFT TOTAL SHOULDER ARTHROPLASTY.    Patient was given perioperative antibiotics: Anti-infectives    Start     Dose/Rate Route Frequency Ordered Stop   01/24/17 1800  clindamycin (CLEOCIN) IVPB 600 mg     600 mg 100 mL/hr over 30 Minutes Intravenous Every 6 hours 01/24/17 1212 01/25/17 0005   01/24/17 1230  clindamycin (CLEOCIN) IVPB 600 mg     600 mg 100 mL/hr over 30 Minutes Intravenous  Once 01/24/17 1222 01/24/17 1348   01/24/17 0600   vancomycin (VANCOCIN) 1,500 mg in sodium chloride 0.9 % 500 mL IVPB     1,500 mg 250 mL/hr over 120 Minutes Intravenous To [facility]Stay [facility] 01/23/17 1050 01/24/17 0915       Patient was given sequential compression devices, early ambulation, and chemoprophylaxis to prevent DVT.  Patient benefited maximally from hospital stay and there were no complications.    Recent vital signs: Patient Vitals for the past 24 hrs:  BP Temp Temp src Pulse Resp SpO2  01/25/17 0440 (!) 144/50 98.6 F (37 C) Oral 69 19 100 %  01/25/17 0019 (!) 132/50 98.3 F (36.8 C) Oral 70 17 96 %  01/24/17 2338 - - - 75 18 96 %  01/24/17 2053 (!) 123/51 97.9 F (36.6 C) Oral 72 18 96 %  01/24/17 1437 (!) 129/38 98.1 F (36.7 C) Oral 79 18 93 %  01/24/17 1345 - (!) 97 F (36.1 C) - 77 20 99 %  01/24/17 1245 (!) 125/48 - - 69 17 94 %  01/24/17 1145 (!) 117/50 - - 66 17 97 %  01/24/17 1115 (!) 122/48 - - 66 17 97 %  01/24/17 1045 (!) 131/51 - - 72 18 94 %  01/24/17 1023 - - - 66 19 99 %  01/24/17 1015 - - - 67 20 98 %  01/24/17  1000 (!) 125/58 - - 69 16 98 %  01/24/17 0946 - 97.7 F (36.5 C) - - - -  01/24/17 0945 (!) 123/50 - - 71 13 96 %     Recent laboratory studies:  Recent Labs  01/25/17 0632  WBC 8.1  HGB 8.5*  HCT 26.8*  PLT 326  NA 133*  K 3.8  CL 96*  CO2 29  BUN 42*  CREATININE 2.95*  GLUCOSE 119*  CALCIUM 8.7*     Discharge Medications:   Allergies as of 01/25/2017      Reactions   Cephalexin Hives   Penicillin G Hives, Other (See Comments)   PATIENT DOESN'T REMEMBER [DOES HAVE ALLERGIC RESPONSE TO CEPHALEXIN > HIVES] Has patient had a PCN reaction causing immediate rash, facial/tongue/throat swelling, SOB or lightheadedness with hypotension: unknown Has patient had a PCN reaction causing severe rash involving mucus membranes or skin necrosis: unknown Has patient had a PCN reaction that required hospitalization: unknown Has patient had a PCN reaction occurring within the last  10 years: unknown If all of the above answers are "NO", th   Spironolactone Other (See Comments)   UNSPECIFIED REACTION  "Cannot recall reaction--but knows she IS allergic"   Codeine Other (See Comments)   Heartburn    Doxycycline Itching   Hydrocodone Itching   Percodan [oxycodone-aspirin] Itching      Medication List    TAKE these medications   albuterol 108 (90 Base) MCG/ACT inhaler Commonly known as:  PROVENTIL HFA;VENTOLIN HFA Inhale 2 puffs into the lungs every 6 (six) hours as needed for wheezing or shortness of breath.   amiodarone 200 MG tablet Commonly known as:  PACERONE TAKE 1 TABLET ONCE A DAY IN THE MORNING.   amitriptyline 50 MG tablet Commonly known as:  ELAVIL TAKE 1 TABLET BY MOUTH AT BEDTIME   carvedilol 6.25 MG tablet Commonly known as:  COREG TAKE 1 TABLET BY MOUTH TWICE A DAY   cholecalciferol 1000 units tablet Commonly known as:  VITAMIN D Take 1,000 Units by mouth 2 (two) times daily.   clindamycin 300 MG capsule Commonly known as:  CLEOCIN Take 600 mg by mouth 2 (two) times daily as needed (for dental procedures).   cyanocobalamin 1000 MCG/ML injection Commonly known as:  (VITAMIN B-12) Inject 1,000 mcg into the muscle every 30 (thirty) days.   doxazosin 4 MG tablet Commonly known as:  CARDURA Take 4 mg by mouth at bedtime.   ELIQUIS 2.5 MG Tabs tablet Generic drug:  apixaban Take 2.5 mg by mouth 2 (two) times daily.   fenofibrate 160 MG tablet TAKE 1 TABLET BY MOUTH AT BEDTIME   furosemide 40 MG tablet Commonly known as:  LASIX Take 40 mg by mouth daily.   HYDROmorphone 2 MG tablet Commonly known as:  DILAUDID Take 1-2 tablets (2-4 mg total) by mouth every 4 (four) hours as needed for severe pain.   loperamide 2 MG tablet Commonly known as:  IMODIUM A-D Take 4 mg by mouth 4 (four) times daily as needed for diarrhea or loose stools.   metFORMIN 500 MG 24 hr tablet Commonly known as:  GLUCOPHAGE-XR Take 1,000 mg by mouth 2  (two) times daily.   metolazone 2.5 MG tablet Commonly known as:  ZAROXOLYN Take 2.5 mg by mouth every Tuesday.   SYNTHROID 112 MCG tablet Generic drug:  levothyroxine Take 112 mcg by mouth daily at 2 am. (0400)   tiZANidine 4 MG tablet Commonly known as:  ZANAFLEX Take 8  mg by mouth at bedtime.   traMADol 50 MG tablet Commonly known as:  ULTRAM Take 100 mg by mouth every 6 (six) hours as needed (for pain.).   zolpidem 10 MG tablet Commonly known as:  AMBIEN TAKE 1 TABLET BY MOUTH AT BEDTIME       Diagnostic Studies: Dg Chest 2 View  Result Date: 01/22/2017 CLINICAL DATA:  66 year old female preoperative study for left shoulder surgery. Hypertension and diabetes. Former smoker. EXAM: CHEST  2 VIEW COMPARISON:  07/16/2016 chest radiographs and earlier. FINDINGS: Stable cardiomegaly and mediastinal contours. Calcified aortic atherosclerosis. Visualized tracheal air column is within normal limits. Stable somewhat large lung volumes with no pneumothorax, pulmonary edema, pleural effusion or acute pulmonary opacity. Postoperative changes to the axilla and chest wall. New mild T12 compression fracture since March, with inferior endplate deformity. Other visible osseous structures appear stable. Incidental prominent air-fluid level in the stomach. IMPRESSION: 1.  Stable cardiomegaly. No acute cardiopulmonary abnormality. 2. Mild T12 compression fracture is new since March but age indeterminate. Electronically Signed   By: Genevie Ann M.D.   On: 01/22/2017 08:18   Ct Shoulder Left Wo Contrast  Result Date: 01/03/2017 CLINICAL DATA:  Left shoulder pain and osteoarthritis. EXAM: CT OF THE UPPER LEFT EXTREMITY WITHOUT CONTRAST TECHNIQUE: Multidetector CT imaging of the upper left extremity was performed according to the standard protocol. COMPARISON:  None. FINDINGS: Bones/Joint/Cartilage Severe glenohumeral joint space narrowing with prominent humeral head and glenoid subchondral cystic changes.  Large humeral head osteophytes. Small glenohumeral joint effusion. Minimal degenerative changes of the acromioclavicular joint. No acute fracture or malalignment. Degenerative changes of the cervical spine. Ligaments Suboptimally assessed by CT. Muscles and Tendons No focal abnormality.  No significant muscle atrophy. Soft tissues Unremarkable.  Visualized left lung is clear. IMPRESSION: Severe glenohumeral joint degenerative changes. No acute osseous abnormality. Electronically Signed   By: Titus Dubin M.D.   On: 01/03/2017 13:06   Dg Shoulder Left Port  Result Date: 01/24/2017 CLINICAL DATA:  Status post left shoulder arthroplasty EXAM: LEFT SHOULDER - 1 VIEW COMPARISON:  None. FINDINGS: No fracture or dislocation is noted. Left shoulder prosthesis is seen in satisfactory position. No soft tissue abnormality is noted. IMPRESSION: Status post left shoulder arthroplasty. Electronically Signed   By: Inez Catalina M.D.   On: 01/24/2017 10:12    Disposition: 01-Home or Self Care  Discharge Instructions    Call MD / Call 911    Complete by:  As directed    If you experience chest pain or shortness of breath, CALL 911 and be transported to the hospital emergency room.  If you develope a fever above 101 F, pus (white drainage) or increased drainage or redness at the wound, or calf pain, call your surgeon's office.   Constipation Prevention    Complete by:  As directed    Drink plenty of fluids.  Prune juice may be helpful.  You may use a stool softener, such as Colace (over the counter) 100 mg twice a day.  Use MiraLax (over the counter) for constipation as needed.   Diet - low sodium heart healthy    Complete by:  As directed    Increase activity slowly as tolerated    Complete by:  As directed       Follow-up Information    Tania Ade, MD. Schedule an appointment as soon as possible for a visit in 2 week(s).   Specialty:  Orthopedic Surgery Contact information: [address]  [place] [address] [phone]            Signed: Grier Mitts 01/25/2017, 8:18 AM

## 2017-01-25 NOTE — Discharge Instructions (Signed)
Discharge Instructions after Total Shoulder Arthroplasty   A sling has been provided for you. Remove the sling 5 times each day to perform motion exercises. Use ice on the shoulder intermittently over the first 48 hours after surgery.  Pain medication has been prescribed for you.  Use your medication liberally over the first 48 hours, and then begin to taper your use. You may take Extra Strength Tylenol or Tylenol only in place of the pain pills. DO NOT take ANY nonsteroidal anti-inflammatory pain medications: Advil, Motrin, Ibuprofen, Aleve, Naproxen, or Naprosyn. Take one aspirin a day for 2 weeks after surgery, unless you have an aspirin sensitivity/allergy or asthma. Leave your dressing on until your first follow up visit.  You may shower with the dressing.  Hold your arm as if you still have your sling on while you shower. Active reaching and lifting are not permitted. You may use the operative arm for activities of daily living that do not require the operative arm to leave the side of the body, such as eating, drinking, bathing, etc.      Please call [phone] during normal business hours or [phone] after hours for any problems. Including the following:  - excessive redness of the incisions - drainage for more than 4 days - fever of more than 101.5 F  *Please note that pain medications will not be refilled after hours or on weekends.  Information on my medicine - ELIQUIS (apixaban)  Why was Eliquis prescribed for you? Eliquis was prescribed for you to reduce the risk of blood clots forming after orthopedic surgery.    What do You need to know about Eliquis? Take your Eliquis TWICE DAILY - one tablet in the morning and one tablet in the evening with or without food.  It would be best to take the dose about the same time each day.  If you have difficulty swallowing the tablet whole please discuss with your pharmacist how to take the medication safely.  Take Eliquis  exactly as prescribed by your doctor and DO NOT stop taking Eliquis without talking to the doctor who prescribed the medication.  Stopping without other medication to take the place of Eliquis may increase your risk of developing a clot.  After discharge, you should have regular check-up appointments with your healthcare provider that is prescribing your Eliquis.  What do you do if you miss a dose? If a dose of ELIQUIS is not taken at the scheduled time, take it as soon as possible on the same day and twice-daily administration should be resumed.  The dose should not be doubled to make up for a missed dose.  Do not take more than one tablet of ELIQUIS at the same time.  Important Safety Information A possible side effect of Eliquis is bleeding. You should call your healthcare provider right away if you experience any of the following: ? Bleeding from an injury or your nose that does not stop. ? Unusual colored urine (red or dark brown) or unusual colored stools (red or black). ? Unusual bruising for unknown reasons. ? A serious fall or if you hit your head (even if there is no bleeding).  Some medicines may interact with Eliquis and might increase your risk of bleeding or clotting while on Eliquis. To help avoid this, consult your healthcare provider or pharmacist prior to using any new prescription or non-prescription medications, including herbals, vitamins, non-steroidal anti-inflammatory drugs (NSAIDs) and supplements.  This website has more information on Eliquis (apixaban): http://www.eliquis.com/eliquis/home

## 2017-01-25 NOTE — Progress Notes (Signed)
   PATIENT ID: Maureen [name]   1 Day Post-Op Procedure(s) (LRB): LEFT TOTAL SHOULDER ARTHROPLASTY (Left)  Subjective: Doing well, pain minimal. Feels ready to d/c today.  Objective:  Vitals:   01/25/17 0019 01/25/17 0440  BP: (!) 132/50 (!) 144/50  Pulse: 70 69  Resp: 17 19  Temp: 98.3 F (36.8 C) 98.6 F (37 C)  SpO2: 96% 100%      L UE dressing c/d/i Wiggles fingers, distally NVI  Labs:   Recent Labs  01/25/17 0632  HGB 8.5*   Recent Labs  01/25/17 0632  WBC 8.1  RBC 3.04*  HCT 26.8*  PLT 326   Recent Labs  01/25/17 0632  NA 133*  K 3.8  CL 96*  CO2 29  BUN 42*  CREATININE 2.95*  GLUCOSE 119*  CALCIUM 8.7*    Assessment and Plan: 1 day s/p L TSA Moving slowly with her given poor bone quality, OT to teach  Hand, wrist, elbow ROM only D/c home today after OT Scripts signed, in chart Cr trending higher, will within normal limits for patient, continue running IV NS ABLA- expected, asymptomatic Restart eliquis Fu w/ dr. Tamera Punt in 2 weeks  VTE proph: eliquis, SCDs

## 2017-02-08 DIAGNOSIS — M19012 Primary osteoarthritis, left shoulder: Secondary | ICD-10-CM | POA: Diagnosis not present

## 2017-02-14 DIAGNOSIS — J4520 Mild intermittent asthma, uncomplicated: Secondary | ICD-10-CM | POA: Diagnosis not present

## 2017-02-14 DIAGNOSIS — G4733 Obstructive sleep apnea (adult) (pediatric): Secondary | ICD-10-CM | POA: Diagnosis not present

## 2017-02-14 DIAGNOSIS — I509 Heart failure, unspecified: Secondary | ICD-10-CM | POA: Diagnosis not present

## 2017-03-06 DIAGNOSIS — G4733 Obstructive sleep apnea (adult) (pediatric): Secondary | ICD-10-CM | POA: Diagnosis not present

## 2017-03-08 DIAGNOSIS — Z96612 Presence of left artificial shoulder joint: Secondary | ICD-10-CM | POA: Diagnosis not present

## 2017-03-08 DIAGNOSIS — M19012 Primary osteoarthritis, left shoulder: Secondary | ICD-10-CM | POA: Diagnosis not present

## 2017-03-08 DIAGNOSIS — Z471 Aftercare following joint replacement surgery: Secondary | ICD-10-CM | POA: Diagnosis not present

## 2017-03-12 DIAGNOSIS — Z780 Asymptomatic menopausal state: Secondary | ICD-10-CM | POA: Diagnosis not present

## 2017-03-12 DIAGNOSIS — M810 Age-related osteoporosis without current pathological fracture: Secondary | ICD-10-CM | POA: Diagnosis not present

## 2017-03-12 DIAGNOSIS — M85852 Other specified disorders of bone density and structure, left thigh: Secondary | ICD-10-CM | POA: Diagnosis not present

## 2017-03-20 DIAGNOSIS — M25612 Stiffness of left shoulder, not elsewhere classified: Secondary | ICD-10-CM | POA: Diagnosis not present

## 2017-03-20 DIAGNOSIS — M25512 Pain in left shoulder: Secondary | ICD-10-CM | POA: Diagnosis not present

## 2017-03-20 DIAGNOSIS — Z96612 Presence of left artificial shoulder joint: Secondary | ICD-10-CM | POA: Diagnosis not present

## 2017-03-26 DIAGNOSIS — M25512 Pain in left shoulder: Secondary | ICD-10-CM | POA: Diagnosis not present

## 2017-03-26 DIAGNOSIS — M25612 Stiffness of left shoulder, not elsewhere classified: Secondary | ICD-10-CM | POA: Diagnosis not present

## 2017-03-28 DIAGNOSIS — M25512 Pain in left shoulder: Secondary | ICD-10-CM | POA: Diagnosis not present

## 2017-03-28 DIAGNOSIS — M25612 Stiffness of left shoulder, not elsewhere classified: Secondary | ICD-10-CM | POA: Diagnosis not present

## 2017-04-08 DIAGNOSIS — M25512 Pain in left shoulder: Secondary | ICD-10-CM | POA: Diagnosis not present

## 2017-04-08 DIAGNOSIS — M25612 Stiffness of left shoulder, not elsewhere classified: Secondary | ICD-10-CM | POA: Diagnosis not present

## 2017-04-10 DIAGNOSIS — M25612 Stiffness of left shoulder, not elsewhere classified: Secondary | ICD-10-CM | POA: Diagnosis not present

## 2017-04-10 DIAGNOSIS — M25512 Pain in left shoulder: Secondary | ICD-10-CM | POA: Diagnosis not present

## 2017-04-19 DIAGNOSIS — M25612 Stiffness of left shoulder, not elsewhere classified: Secondary | ICD-10-CM | POA: Diagnosis not present

## 2017-04-19 DIAGNOSIS — Z96612 Presence of left artificial shoulder joint: Secondary | ICD-10-CM | POA: Diagnosis not present

## 2017-04-19 DIAGNOSIS — M25512 Pain in left shoulder: Secondary | ICD-10-CM | POA: Diagnosis not present

## 2017-04-19 DIAGNOSIS — Z471 Aftercare following joint replacement surgery: Secondary | ICD-10-CM | POA: Diagnosis not present

## 2017-04-26 DIAGNOSIS — N186 End stage renal disease: Secondary | ICD-10-CM | POA: Diagnosis not present

## 2017-04-29 DIAGNOSIS — M25512 Pain in left shoulder: Secondary | ICD-10-CM | POA: Diagnosis not present

## 2017-04-29 DIAGNOSIS — M25612 Stiffness of left shoulder, not elsewhere classified: Secondary | ICD-10-CM | POA: Diagnosis not present

## 2017-05-02 DIAGNOSIS — M25512 Pain in left shoulder: Secondary | ICD-10-CM | POA: Diagnosis not present

## 2017-05-02 DIAGNOSIS — M25612 Stiffness of left shoulder, not elsewhere classified: Secondary | ICD-10-CM | POA: Diagnosis not present

## 2017-05-07 DIAGNOSIS — M25512 Pain in left shoulder: Secondary | ICD-10-CM | POA: Diagnosis not present

## 2017-05-07 DIAGNOSIS — M25612 Stiffness of left shoulder, not elsewhere classified: Secondary | ICD-10-CM | POA: Diagnosis not present

## 2017-05-10 DIAGNOSIS — M25612 Stiffness of left shoulder, not elsewhere classified: Secondary | ICD-10-CM | POA: Diagnosis not present

## 2017-05-10 DIAGNOSIS — M25512 Pain in left shoulder: Secondary | ICD-10-CM | POA: Diagnosis not present

## 2017-05-14 DIAGNOSIS — R413 Other amnesia: Secondary | ICD-10-CM | POA: Diagnosis not present

## 2017-05-15 DIAGNOSIS — M25512 Pain in left shoulder: Secondary | ICD-10-CM | POA: Diagnosis not present

## 2017-05-15 DIAGNOSIS — M25612 Stiffness of left shoulder, not elsewhere classified: Secondary | ICD-10-CM | POA: Diagnosis not present

## 2017-05-24 DIAGNOSIS — M25512 Pain in left shoulder: Secondary | ICD-10-CM | POA: Diagnosis not present

## 2017-05-24 DIAGNOSIS — M25612 Stiffness of left shoulder, not elsewhere classified: Secondary | ICD-10-CM | POA: Diagnosis not present

## 2017-05-28 DIAGNOSIS — E119 Type 2 diabetes mellitus without complications: Secondary | ICD-10-CM | POA: Diagnosis not present

## 2017-05-28 DIAGNOSIS — I129 Hypertensive chronic kidney disease with stage 1 through stage 4 chronic kidney disease, or unspecified chronic kidney disease: Secondary | ICD-10-CM | POA: Diagnosis not present

## 2017-05-28 DIAGNOSIS — N9089 Other specified noninflammatory disorders of vulva and perineum: Secondary | ICD-10-CM | POA: Diagnosis not present

## 2017-05-28 DIAGNOSIS — L299 Pruritus, unspecified: Secondary | ICD-10-CM | POA: Diagnosis not present

## 2017-05-29 DIAGNOSIS — M25512 Pain in left shoulder: Secondary | ICD-10-CM | POA: Diagnosis not present

## 2017-05-29 DIAGNOSIS — M25612 Stiffness of left shoulder, not elsewhere classified: Secondary | ICD-10-CM | POA: Diagnosis not present

## 2017-05-30 DIAGNOSIS — M25612 Stiffness of left shoulder, not elsewhere classified: Secondary | ICD-10-CM | POA: Diagnosis not present

## 2017-05-30 DIAGNOSIS — M25512 Pain in left shoulder: Secondary | ICD-10-CM | POA: Diagnosis not present

## 2017-06-04 DIAGNOSIS — N9089 Other specified noninflammatory disorders of vulva and perineum: Secondary | ICD-10-CM | POA: Diagnosis not present

## 2017-06-04 DIAGNOSIS — N900 Mild vulvar dysplasia: Secondary | ICD-10-CM | POA: Diagnosis not present

## 2017-06-07 DIAGNOSIS — M25612 Stiffness of left shoulder, not elsewhere classified: Secondary | ICD-10-CM | POA: Diagnosis not present

## 2017-06-07 DIAGNOSIS — M25512 Pain in left shoulder: Secondary | ICD-10-CM | POA: Diagnosis not present

## 2017-06-11 DIAGNOSIS — G4733 Obstructive sleep apnea (adult) (pediatric): Secondary | ICD-10-CM | POA: Diagnosis not present

## 2017-06-17 DIAGNOSIS — M25612 Stiffness of left shoulder, not elsewhere classified: Secondary | ICD-10-CM | POA: Diagnosis not present

## 2017-06-17 DIAGNOSIS — M25512 Pain in left shoulder: Secondary | ICD-10-CM | POA: Diagnosis not present

## 2017-06-19 DIAGNOSIS — Z87412 Personal history of vulvar dysplasia: Secondary | ICD-10-CM | POA: Diagnosis not present

## 2017-06-19 DIAGNOSIS — N9089 Other specified noninflammatory disorders of vulva and perineum: Secondary | ICD-10-CM | POA: Diagnosis not present

## 2017-06-19 DIAGNOSIS — N895 Stricture and atresia of vagina: Secondary | ICD-10-CM | POA: Diagnosis not present

## 2017-06-19 DIAGNOSIS — R1909 Other intra-abdominal and pelvic swelling, mass and lump: Secondary | ICD-10-CM | POA: Diagnosis not present

## 2017-06-19 DIAGNOSIS — D071 Carcinoma in situ of vulva: Secondary | ICD-10-CM | POA: Diagnosis not present

## 2017-06-20 DIAGNOSIS — M25512 Pain in left shoulder: Secondary | ICD-10-CM | POA: Diagnosis not present

## 2017-06-20 DIAGNOSIS — M25612 Stiffness of left shoulder, not elsewhere classified: Secondary | ICD-10-CM | POA: Diagnosis not present

## 2017-06-26 DIAGNOSIS — M25512 Pain in left shoulder: Secondary | ICD-10-CM | POA: Diagnosis not present

## 2017-06-26 DIAGNOSIS — M25612 Stiffness of left shoulder, not elsewhere classified: Secondary | ICD-10-CM | POA: Diagnosis not present

## 2017-06-28 DIAGNOSIS — I129 Hypertensive chronic kidney disease with stage 1 through stage 4 chronic kidney disease, or unspecified chronic kidney disease: Secondary | ICD-10-CM | POA: Diagnosis not present

## 2017-06-28 DIAGNOSIS — N184 Chronic kidney disease, stage 4 (severe): Secondary | ICD-10-CM | POA: Diagnosis not present

## 2017-06-28 DIAGNOSIS — E119 Type 2 diabetes mellitus without complications: Secondary | ICD-10-CM | POA: Diagnosis not present

## 2017-07-04 DIAGNOSIS — N9089 Other specified noninflammatory disorders of vulva and perineum: Secondary | ICD-10-CM | POA: Diagnosis not present

## 2017-07-04 DIAGNOSIS — N903 Dysplasia of vulva, unspecified: Secondary | ICD-10-CM | POA: Diagnosis not present

## 2017-07-17 DIAGNOSIS — Z96612 Presence of left artificial shoulder joint: Secondary | ICD-10-CM | POA: Diagnosis not present

## 2017-07-17 DIAGNOSIS — M25512 Pain in left shoulder: Secondary | ICD-10-CM | POA: Diagnosis not present

## 2017-07-17 DIAGNOSIS — Z471 Aftercare following joint replacement surgery: Secondary | ICD-10-CM | POA: Diagnosis not present

## 2017-07-24 DIAGNOSIS — E785 Hyperlipidemia, unspecified: Secondary | ICD-10-CM | POA: Diagnosis not present

## 2017-07-24 DIAGNOSIS — E039 Hypothyroidism, unspecified: Secondary | ICD-10-CM | POA: Diagnosis not present

## 2017-07-24 DIAGNOSIS — I480 Paroxysmal atrial fibrillation: Secondary | ICD-10-CM | POA: Diagnosis not present

## 2017-07-24 DIAGNOSIS — Z7984 Long term (current) use of oral hypoglycemic drugs: Secondary | ICD-10-CM | POA: Diagnosis not present

## 2017-07-24 DIAGNOSIS — I129 Hypertensive chronic kidney disease with stage 1 through stage 4 chronic kidney disease, or unspecified chronic kidney disease: Secondary | ICD-10-CM | POA: Diagnosis not present

## 2017-07-24 DIAGNOSIS — G4700 Insomnia, unspecified: Secondary | ICD-10-CM | POA: Diagnosis not present

## 2017-07-24 DIAGNOSIS — N189 Chronic kidney disease, unspecified: Secondary | ICD-10-CM | POA: Diagnosis not present

## 2017-07-24 DIAGNOSIS — E1122 Type 2 diabetes mellitus with diabetic chronic kidney disease: Secondary | ICD-10-CM | POA: Diagnosis not present

## 2017-07-24 DIAGNOSIS — D071 Carcinoma in situ of vulva: Secondary | ICD-10-CM | POA: Diagnosis not present

## 2017-07-30 DIAGNOSIS — I480 Paroxysmal atrial fibrillation: Secondary | ICD-10-CM | POA: Diagnosis not present

## 2017-07-30 DIAGNOSIS — D045 Carcinoma in situ of skin of trunk: Secondary | ICD-10-CM | POA: Diagnosis not present

## 2017-07-30 DIAGNOSIS — D071 Carcinoma in situ of vulva: Secondary | ICD-10-CM | POA: Diagnosis not present

## 2017-07-30 DIAGNOSIS — R87623 High grade squamous intraepithelial lesion on cytologic smear of vagina (HGSIL): Secondary | ICD-10-CM | POA: Diagnosis not present

## 2017-08-05 ENCOUNTER — Other Ambulatory Visit: Payer: Self-pay | Admitting: General Surgery

## 2017-08-05 DIAGNOSIS — K439 Ventral hernia without obstruction or gangrene: Secondary | ICD-10-CM

## 2017-08-05 DIAGNOSIS — D0512 Intraductal carcinoma in situ of left breast: Secondary | ICD-10-CM | POA: Diagnosis not present

## 2017-08-15 ENCOUNTER — Ambulatory Visit
Admission: RE | Admit: 2017-08-15 | Discharge: 2017-08-15 | Disposition: A | Discharge status: Home / Self Care | Payer: PPO | Source: Ambulatory Visit | Attending: General Surgery | Admitting: General Surgery

## 2017-08-15 ENCOUNTER — Other Ambulatory Visit: Payer: Self-pay | Admitting: General Surgery

## 2017-08-15 DIAGNOSIS — K439 Ventral hernia without obstruction or gangrene: Secondary | ICD-10-CM

## 2017-08-26 DIAGNOSIS — I5032 Chronic diastolic (congestive) heart failure: Secondary | ICD-10-CM | POA: Diagnosis not present

## 2017-08-26 DIAGNOSIS — R634 Abnormal weight loss: Secondary | ICD-10-CM | POA: Diagnosis not present

## 2017-08-26 DIAGNOSIS — D649 Anemia, unspecified: Secondary | ICD-10-CM | POA: Diagnosis not present

## 2017-08-26 DIAGNOSIS — N183 Chronic kidney disease, stage 3 (moderate): Secondary | ICD-10-CM | POA: Diagnosis not present

## 2017-08-26 DIAGNOSIS — I10 Essential (primary) hypertension: Secondary | ICD-10-CM | POA: Diagnosis not present

## 2017-08-28 DIAGNOSIS — I129 Hypertensive chronic kidney disease with stage 1 through stage 4 chronic kidney disease, or unspecified chronic kidney disease: Secondary | ICD-10-CM | POA: Diagnosis not present

## 2017-08-28 DIAGNOSIS — G4733 Obstructive sleep apnea (adult) (pediatric): Secondary | ICD-10-CM | POA: Diagnosis not present

## 2017-08-28 DIAGNOSIS — I4891 Unspecified atrial fibrillation: Secondary | ICD-10-CM | POA: Diagnosis not present

## 2017-08-28 DIAGNOSIS — E119 Type 2 diabetes mellitus without complications: Secondary | ICD-10-CM | POA: Diagnosis not present

## 2017-08-29 DIAGNOSIS — N184 Chronic kidney disease, stage 4 (severe): Secondary | ICD-10-CM | POA: Diagnosis not present

## 2017-08-29 DIAGNOSIS — I129 Hypertensive chronic kidney disease with stage 1 through stage 4 chronic kidney disease, or unspecified chronic kidney disease: Secondary | ICD-10-CM | POA: Diagnosis not present

## 2017-08-29 DIAGNOSIS — E1122 Type 2 diabetes mellitus with diabetic chronic kidney disease: Secondary | ICD-10-CM | POA: Diagnosis not present

## 2017-08-29 DIAGNOSIS — J301 Allergic rhinitis due to pollen: Secondary | ICD-10-CM | POA: Diagnosis not present

## 2017-08-29 DIAGNOSIS — L821 Other seborrheic keratosis: Secondary | ICD-10-CM | POA: Diagnosis not present

## 2017-09-04 DIAGNOSIS — Z483 Aftercare following surgery for neoplasm: Secondary | ICD-10-CM | POA: Diagnosis not present

## 2017-09-04 DIAGNOSIS — D071 Carcinoma in situ of vulva: Secondary | ICD-10-CM | POA: Diagnosis not present

## 2017-09-10 DIAGNOSIS — G4733 Obstructive sleep apnea (adult) (pediatric): Secondary | ICD-10-CM | POA: Diagnosis not present

## 2017-09-24 DIAGNOSIS — H5201 Hypermetropia, right eye: Secondary | ICD-10-CM | POA: Diagnosis not present

## 2017-09-24 DIAGNOSIS — H4322 Crystalline deposits in vitreous body, left eye: Secondary | ICD-10-CM | POA: Diagnosis not present

## 2017-09-24 DIAGNOSIS — H04123 Dry eye syndrome of bilateral lacrimal glands: Secondary | ICD-10-CM | POA: Diagnosis not present

## 2017-09-24 DIAGNOSIS — G4733 Obstructive sleep apnea (adult) (pediatric): Secondary | ICD-10-CM | POA: Diagnosis not present

## 2017-09-24 DIAGNOSIS — E113291 Type 2 diabetes mellitus with mild nonproliferative diabetic retinopathy without macular edema, right eye: Secondary | ICD-10-CM | POA: Diagnosis not present

## 2017-09-24 DIAGNOSIS — H52223 Regular astigmatism, bilateral: Secondary | ICD-10-CM | POA: Diagnosis not present

## 2017-10-05 IMAGING — CT CT SHOULDER*L* W/O CM
1 series · 12 of 14 positions shown, 15 images · non-contrast
Comparison: None.

CLINICAL DATA: Left shoulder pain and osteoarthritis.

EXAM:
CT OF THE UPPER LEFT EXTREMITY WITHOUT CONTRAST
TECHNIQUE: Multidetector CT imaging of the upper left extremity was performed
according to the standard protocol.

[Series 2: soft tissue · axial · 0.51mm/px · z∈[+554,+716]mm · 12 of 97 slices shown, 15 images]
[im 8/97  soft-tissue]
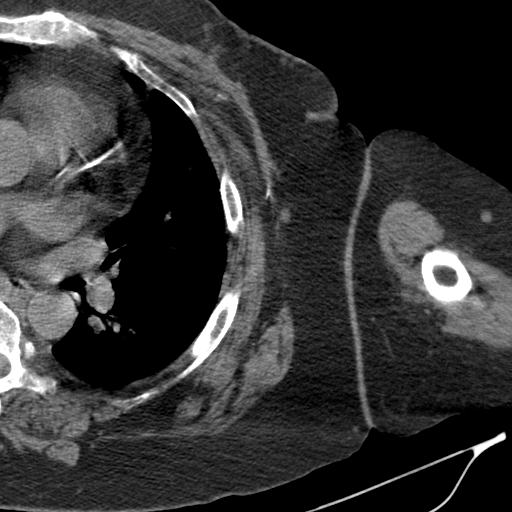
[im 8/97  bone]
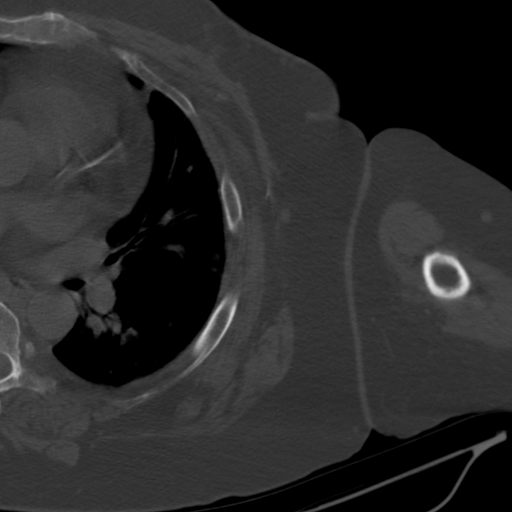
[im 15/97  bone]
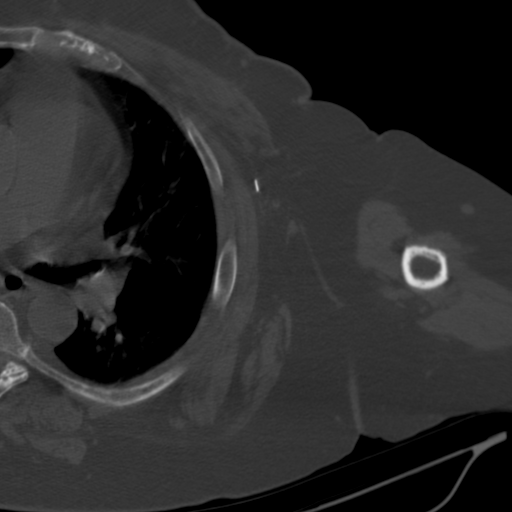
[im 23/97  bone]
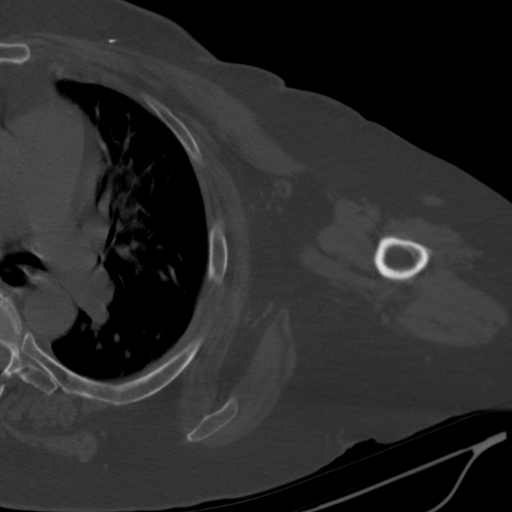
[im 30/97  bone]
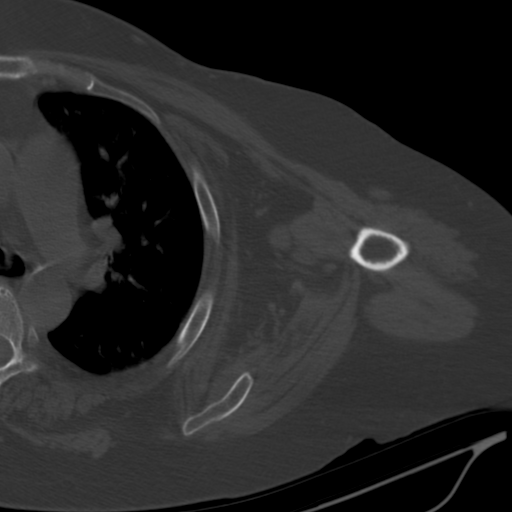
[im 37/97  soft-tissue]
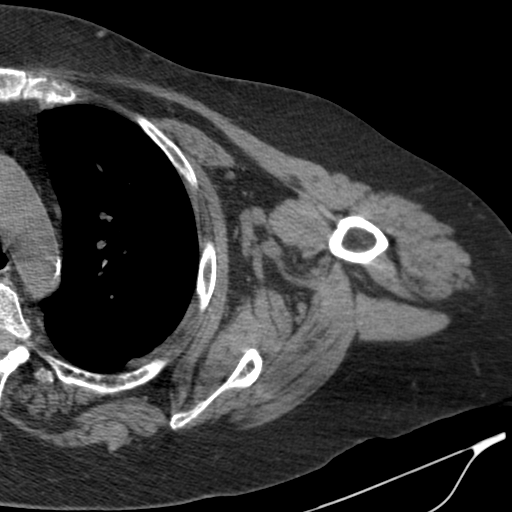
[im 37/97  bone]
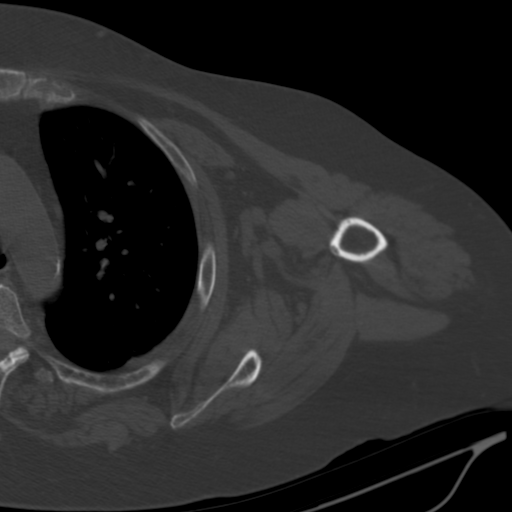
[im 45/97  bone]
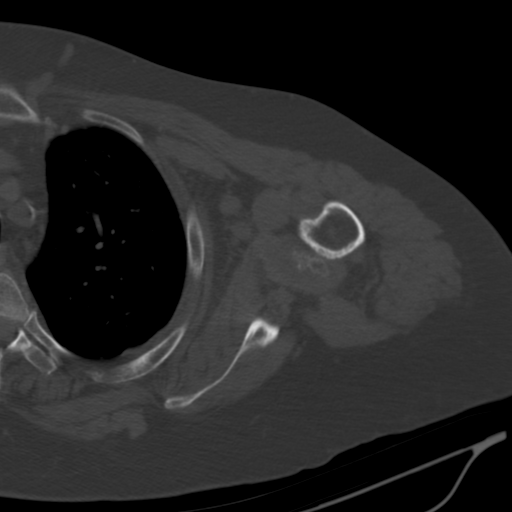
[im 52/97  bone]
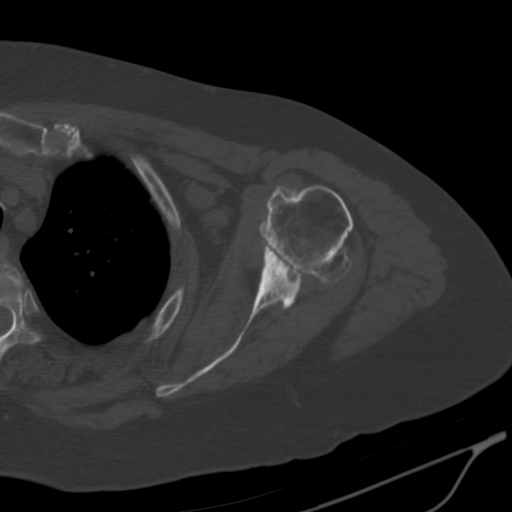
[im 60/97  bone]
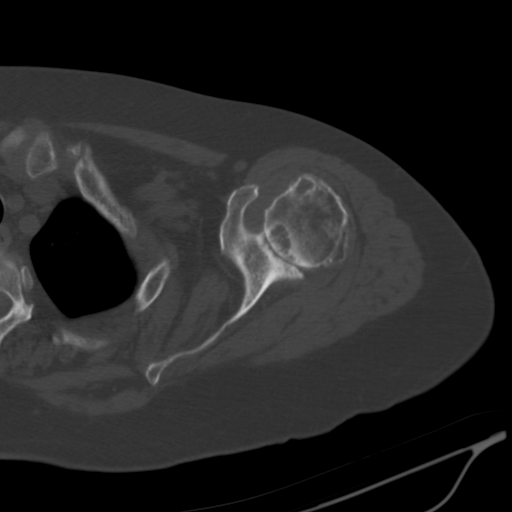
[im 67/97  soft-tissue]
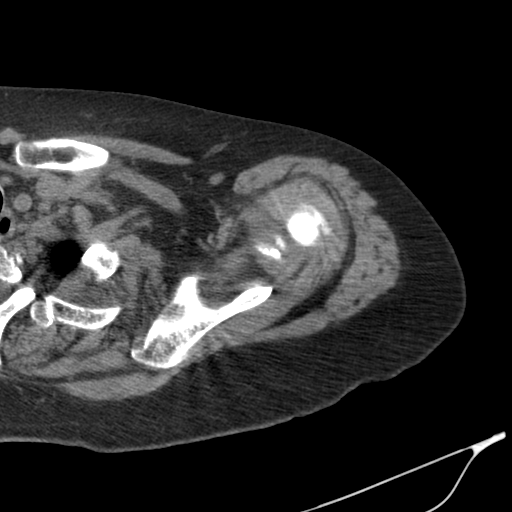
[im 67/97  bone]
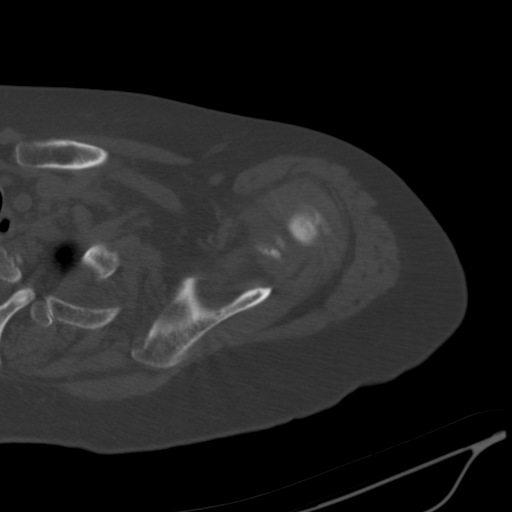
[im 74/97  bone]
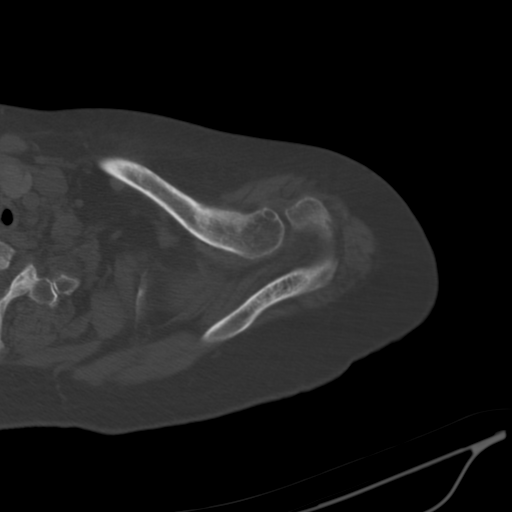
[im 82/97  bone]
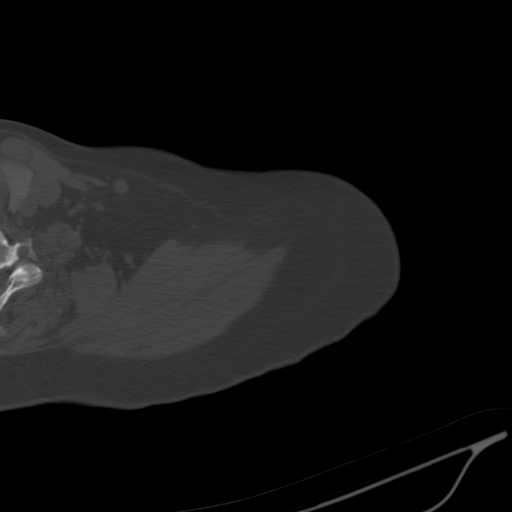
[im 89/97  bone]
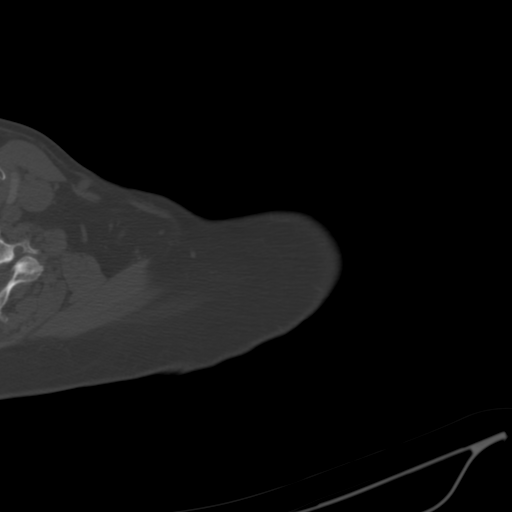

[12 of 14 positions shown; findings below may reference images not displayed]

FINDINGS: Bones/Joint/Cartilage

Severe glenohumeral joint space narrowing with prominent humeral
head and glenoid subchondral cystic changes. Large humeral head
osteophytes. Small glenohumeral joint effusion. Minimal degenerative
changes of the acromioclavicular joint.

No acute fracture or malalignment. Degenerative changes of the
cervical spine.

Ligaments

Suboptimally assessed by CT.

Muscles and Tendons

No focal abnormality.  No significant muscle atrophy.

Soft tissues

Unremarkable.  Visualized left lung is clear.
IMPRESSION: Severe glenohumeral joint degenerative changes. No acute osseous
abnormality.

## 2017-10-18 DIAGNOSIS — E119 Type 2 diabetes mellitus without complications: Secondary | ICD-10-CM | POA: Diagnosis not present

## 2017-10-26 IMAGING — DX DG SHOULDER 1V*L*
1 series · 1 of 1 positions shown · non-contrast
Comparison: None.

CLINICAL DATA: Status post left shoulder arthroplasty

EXAM:
LEFT SHOULDER - 1 VIEW

[shoulder ap]
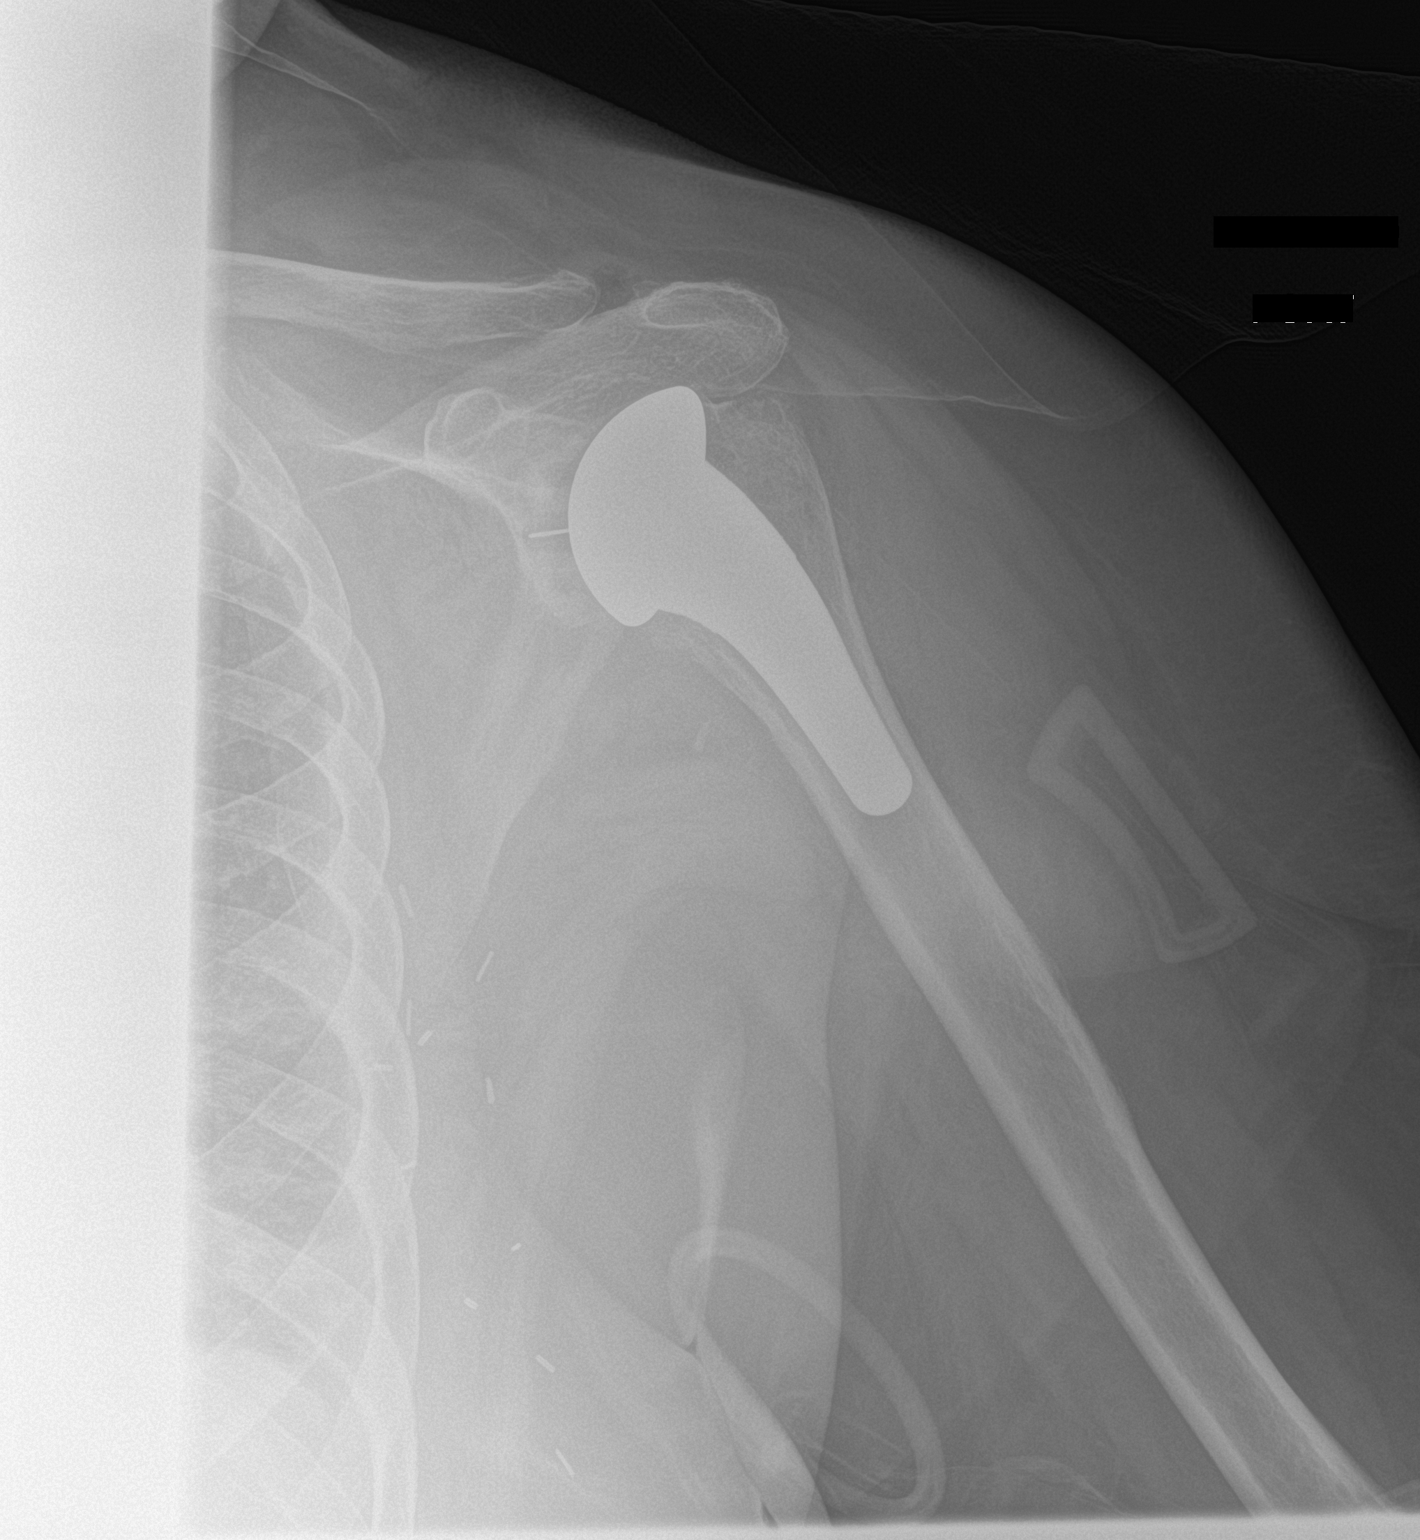

[1 of 1 positions shown; findings below may reference images not displayed]

FINDINGS: No fracture or dislocation is noted. Left shoulder prosthesis is
seen in satisfactory position. No soft tissue abnormality is noted.
IMPRESSION: Status post left shoulder arthroplasty.

## 2017-12-16 DIAGNOSIS — G4733 Obstructive sleep apnea (adult) (pediatric): Secondary | ICD-10-CM | POA: Diagnosis not present

## 2018-01-01 DIAGNOSIS — N186 End stage renal disease: Secondary | ICD-10-CM | POA: Diagnosis not present

## 2018-01-01 DIAGNOSIS — D649 Anemia, unspecified: Secondary | ICD-10-CM | POA: Diagnosis not present

## 2018-01-10 DIAGNOSIS — Z7902 Long term (current) use of antithrombotics/antiplatelets: Secondary | ICD-10-CM | POA: Diagnosis not present

## 2018-01-10 DIAGNOSIS — R110 Nausea: Secondary | ICD-10-CM | POA: Diagnosis not present

## 2018-01-10 DIAGNOSIS — R634 Abnormal weight loss: Secondary | ICD-10-CM | POA: Diagnosis not present

## 2018-01-10 DIAGNOSIS — D649 Anemia, unspecified: Secondary | ICD-10-CM | POA: Diagnosis not present

## 2018-01-15 DIAGNOSIS — Z471 Aftercare following joint replacement surgery: Secondary | ICD-10-CM | POA: Diagnosis not present

## 2018-01-15 DIAGNOSIS — Z96612 Presence of left artificial shoulder joint: Secondary | ICD-10-CM | POA: Diagnosis not present

## 2018-01-15 DIAGNOSIS — M25512 Pain in left shoulder: Secondary | ICD-10-CM | POA: Diagnosis not present

## 2018-01-29 DIAGNOSIS — I129 Hypertensive chronic kidney disease with stage 1 through stage 4 chronic kidney disease, or unspecified chronic kidney disease: Secondary | ICD-10-CM | POA: Diagnosis not present

## 2018-01-29 DIAGNOSIS — Z23 Encounter for immunization: Secondary | ICD-10-CM | POA: Diagnosis not present

## 2018-01-29 DIAGNOSIS — D071 Carcinoma in situ of vulva: Secondary | ICD-10-CM | POA: Diagnosis not present

## 2018-01-29 DIAGNOSIS — N184 Chronic kidney disease, stage 4 (severe): Secondary | ICD-10-CM | POA: Diagnosis not present

## 2018-01-29 DIAGNOSIS — Z0000 Encounter for general adult medical examination without abnormal findings: Secondary | ICD-10-CM | POA: Diagnosis not present

## 2018-01-29 DIAGNOSIS — E538 Deficiency of other specified B group vitamins: Secondary | ICD-10-CM | POA: Diagnosis not present

## 2018-01-29 DIAGNOSIS — R7301 Impaired fasting glucose: Secondary | ICD-10-CM | POA: Diagnosis not present

## 2018-02-14 DIAGNOSIS — I4891 Unspecified atrial fibrillation: Secondary | ICD-10-CM | POA: Diagnosis not present

## 2018-02-14 DIAGNOSIS — I5030 Unspecified diastolic (congestive) heart failure: Secondary | ICD-10-CM | POA: Diagnosis not present

## 2018-02-14 DIAGNOSIS — G4733 Obstructive sleep apnea (adult) (pediatric): Secondary | ICD-10-CM | POA: Diagnosis not present

## 2018-03-06 DIAGNOSIS — N189 Chronic kidney disease, unspecified: Secondary | ICD-10-CM | POA: Diagnosis not present

## 2018-03-06 DIAGNOSIS — I5020 Unspecified systolic (congestive) heart failure: Secondary | ICD-10-CM | POA: Diagnosis not present

## 2018-03-06 DIAGNOSIS — K5730 Diverticulosis of large intestine without perforation or abscess without bleeding: Secondary | ICD-10-CM | POA: Diagnosis not present

## 2018-03-06 DIAGNOSIS — Z1211 Encounter for screening for malignant neoplasm of colon: Secondary | ICD-10-CM | POA: Diagnosis not present

## 2018-03-06 DIAGNOSIS — R634 Abnormal weight loss: Secondary | ICD-10-CM | POA: Diagnosis not present

## 2018-03-06 DIAGNOSIS — I4891 Unspecified atrial fibrillation: Secondary | ICD-10-CM | POA: Diagnosis not present

## 2018-03-06 DIAGNOSIS — K219 Gastro-esophageal reflux disease without esophagitis: Secondary | ICD-10-CM | POA: Diagnosis not present

## 2018-03-06 DIAGNOSIS — Z7984 Long term (current) use of oral hypoglycemic drugs: Secondary | ICD-10-CM | POA: Diagnosis not present

## 2018-03-06 DIAGNOSIS — I130 Hypertensive heart and chronic kidney disease with heart failure and stage 1 through stage 4 chronic kidney disease, or unspecified chronic kidney disease: Secondary | ICD-10-CM | POA: Diagnosis not present

## 2018-03-06 DIAGNOSIS — E1122 Type 2 diabetes mellitus with diabetic chronic kidney disease: Secondary | ICD-10-CM | POA: Diagnosis not present

## 2018-03-06 DIAGNOSIS — G4730 Sleep apnea, unspecified: Secondary | ICD-10-CM | POA: Diagnosis not present

## 2018-03-06 DIAGNOSIS — K648 Other hemorrhoids: Secondary | ICD-10-CM | POA: Diagnosis not present

## 2018-03-06 DIAGNOSIS — R110 Nausea: Secondary | ICD-10-CM | POA: Diagnosis not present

## 2018-03-06 DIAGNOSIS — Z86010 Personal history of colonic polyps: Secondary | ICD-10-CM | POA: Diagnosis not present

## 2018-03-06 DIAGNOSIS — K6389 Other specified diseases of intestine: Secondary | ICD-10-CM | POA: Diagnosis not present

## 2018-03-06 DIAGNOSIS — Z87891 Personal history of nicotine dependence: Secondary | ICD-10-CM | POA: Diagnosis not present

## 2018-03-19 DIAGNOSIS — G4733 Obstructive sleep apnea (adult) (pediatric): Secondary | ICD-10-CM | POA: Diagnosis not present

## 2018-03-24 DIAGNOSIS — Z170 Estrogen receptor positive status [ER+]: Secondary | ICD-10-CM | POA: Diagnosis not present

## 2018-03-24 DIAGNOSIS — I129 Hypertensive chronic kidney disease with stage 1 through stage 4 chronic kidney disease, or unspecified chronic kidney disease: Secondary | ICD-10-CM | POA: Diagnosis not present

## 2018-03-24 DIAGNOSIS — G4733 Obstructive sleep apnea (adult) (pediatric): Secondary | ICD-10-CM | POA: Diagnosis not present

## 2018-03-24 DIAGNOSIS — N184 Chronic kidney disease, stage 4 (severe): Secondary | ICD-10-CM | POA: Diagnosis not present

## 2018-03-24 DIAGNOSIS — I4891 Unspecified atrial fibrillation: Secondary | ICD-10-CM | POA: Diagnosis not present

## 2018-03-24 DIAGNOSIS — C50412 Malignant neoplasm of upper-outer quadrant of left female breast: Secondary | ICD-10-CM | POA: Diagnosis not present

## 2018-03-24 DIAGNOSIS — J4520 Mild intermittent asthma, uncomplicated: Secondary | ICD-10-CM | POA: Diagnosis not present

## 2018-03-26 DIAGNOSIS — H5201 Hypermetropia, right eye: Secondary | ICD-10-CM | POA: Diagnosis not present

## 2018-03-26 DIAGNOSIS — H4322 Crystalline deposits in vitreous body, left eye: Secondary | ICD-10-CM | POA: Diagnosis not present

## 2018-03-26 DIAGNOSIS — G4733 Obstructive sleep apnea (adult) (pediatric): Secondary | ICD-10-CM | POA: Diagnosis not present

## 2018-03-26 DIAGNOSIS — Z8679 Personal history of other diseases of the circulatory system: Secondary | ICD-10-CM | POA: Diagnosis not present

## 2018-03-26 DIAGNOSIS — H52223 Regular astigmatism, bilateral: Secondary | ICD-10-CM | POA: Diagnosis not present

## 2018-03-26 DIAGNOSIS — H53032 Strabismic amblyopia, left eye: Secondary | ICD-10-CM | POA: Diagnosis not present

## 2018-03-26 DIAGNOSIS — E113291 Type 2 diabetes mellitus with mild nonproliferative diabetic retinopathy without macular edema, right eye: Secondary | ICD-10-CM | POA: Diagnosis not present

## 2018-03-26 DIAGNOSIS — H04123 Dry eye syndrome of bilateral lacrimal glands: Secondary | ICD-10-CM | POA: Diagnosis not present

## 2018-03-26 DIAGNOSIS — Z83518 Family history of other specified eye disorder: Secondary | ICD-10-CM | POA: Diagnosis not present

## 2018-04-01 DIAGNOSIS — D0512 Intraductal carcinoma in situ of left breast: Secondary | ICD-10-CM | POA: Diagnosis not present

## 2018-04-17 DIAGNOSIS — H33311 Horseshoe tear of retina without detachment, right eye: Secondary | ICD-10-CM | POA: Diagnosis not present

## 2018-05-17 IMAGING — CT CT ABD-PELV W/O CM
1 of 2 series · 14 of 32 positions shown, 19 images · non-contrast
Comparison: Renal ultrasound 08/03/2011

CLINICAL DATA: Ventral hernia

EXAM:
CT ABDOMEN AND PELVIS WITHOUT CONTRAST
TECHNIQUE: Multidetector CT imaging of the abdomen and pelvis was performed
following the standard protocol without IV contrast.
Creatinine was obtained on site at [HOSPITAL] at [HOSPITAL].
Results: Creatinine 3.2 mg/dL.

[Series 2: abd/pelvis w/(date) · axial · 0.92mm/px · z∈[-360,+25]mm · 14 of 87 slices shown, 19 images]
[im 5/87  soft-tissue]
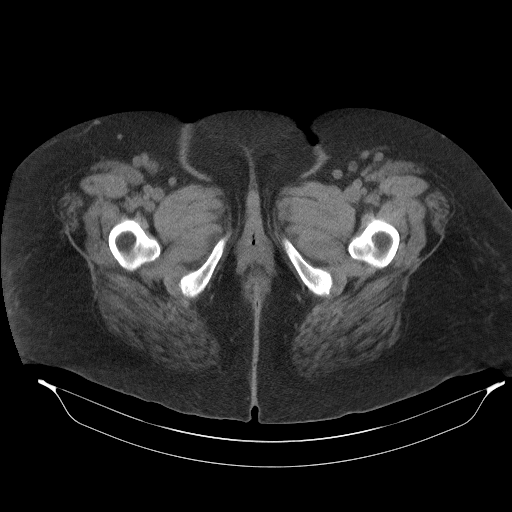
[im 5/87  bone]
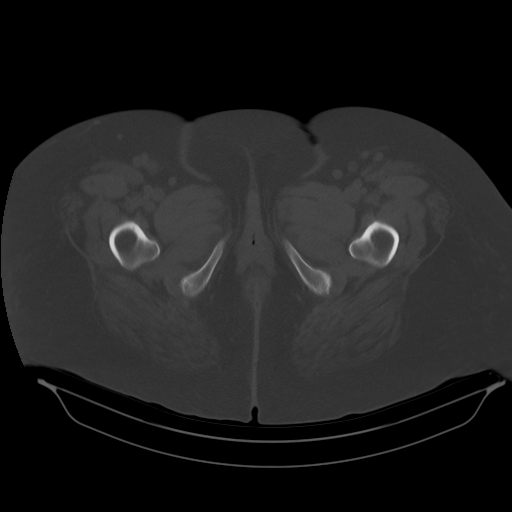
[im 14/87  soft-tissue]
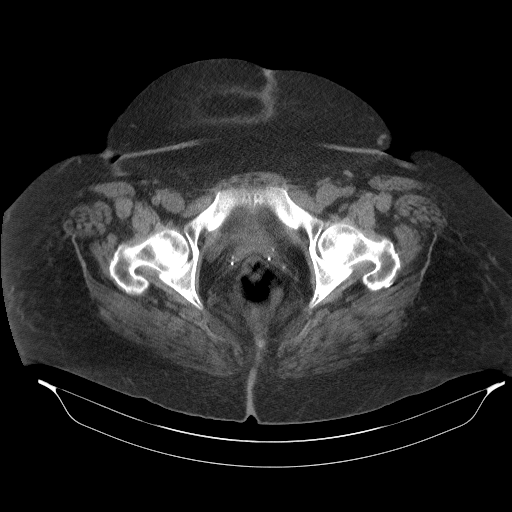
[im 19/87  soft-tissue]
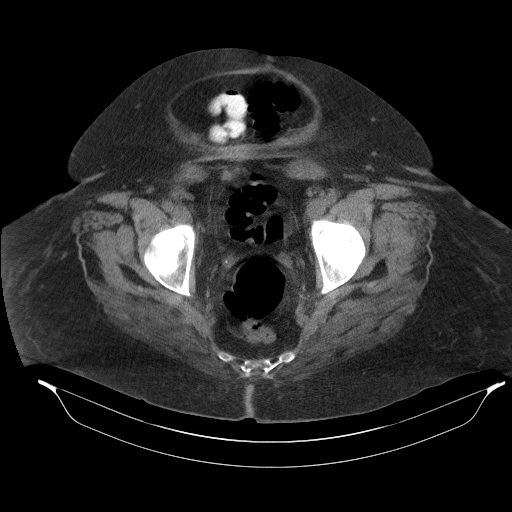
[im 23/87  soft-tissue]
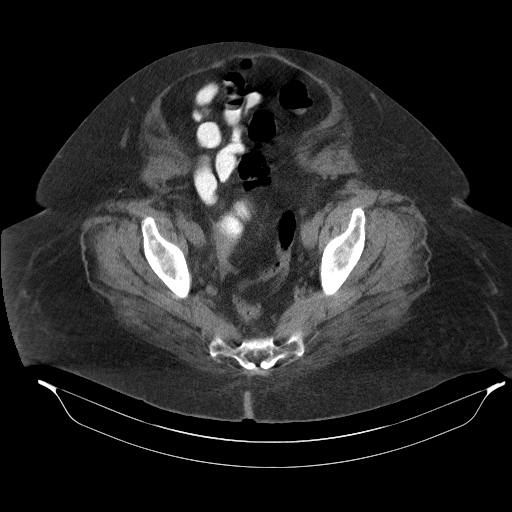
[im 32/87  soft-tissue]
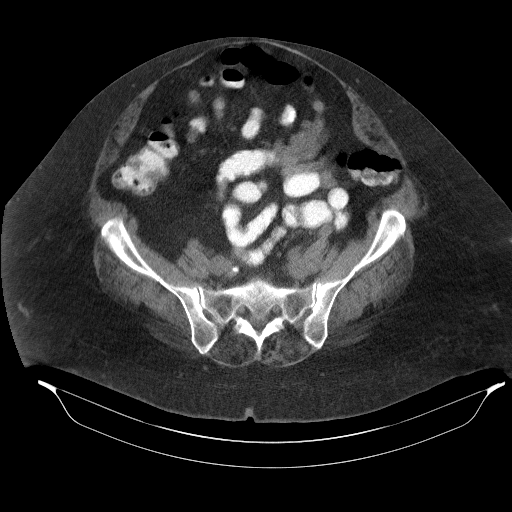
[im 37/87  soft-tissue]
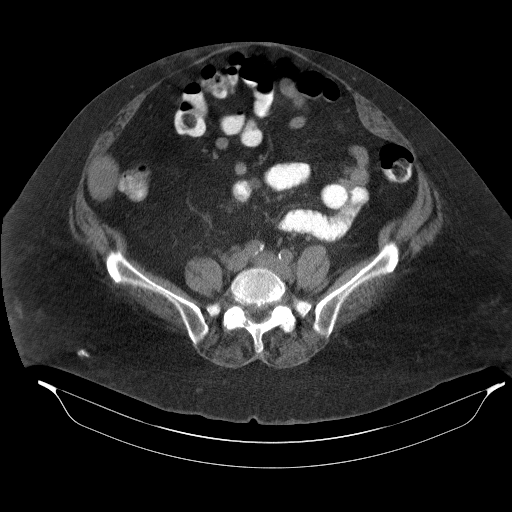
[im 46/87  soft-tissue]
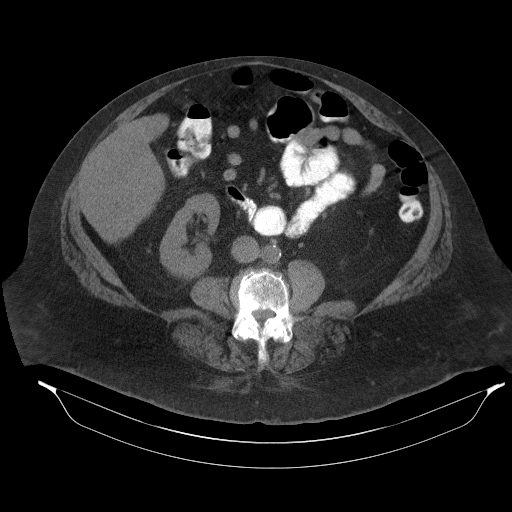
[im 50/87  soft-tissue]
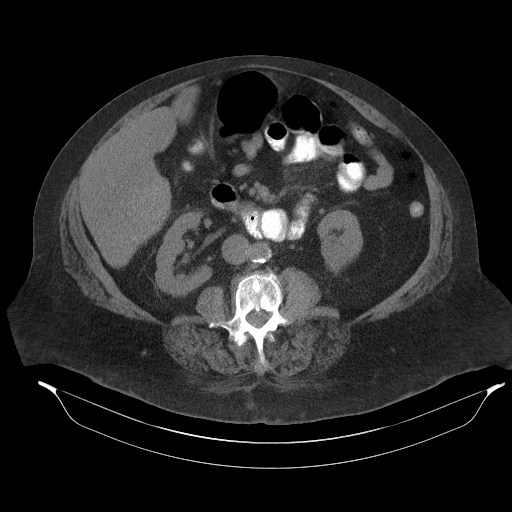
[im 55/87  soft-tissue]
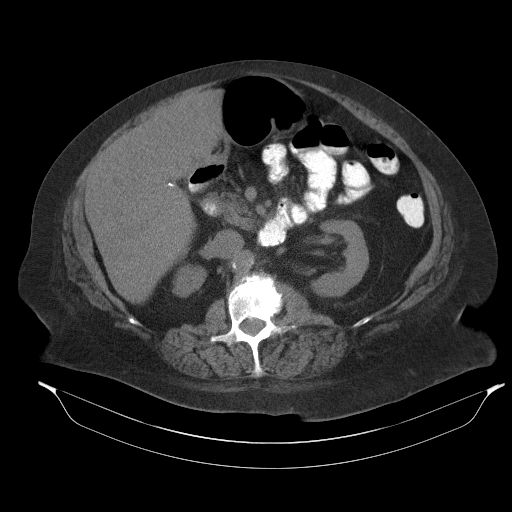
[im 55/87  bone]
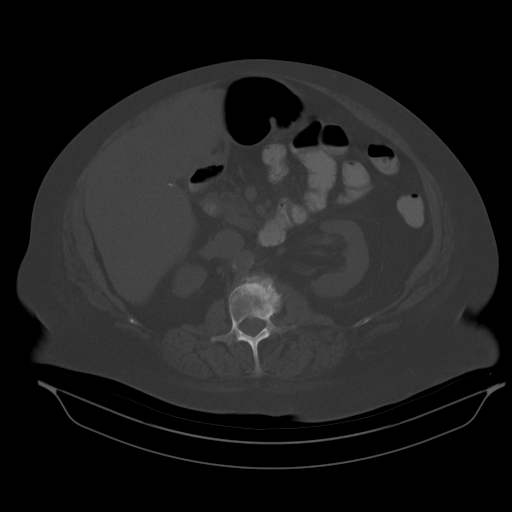
[im 64/87  soft-tissue]
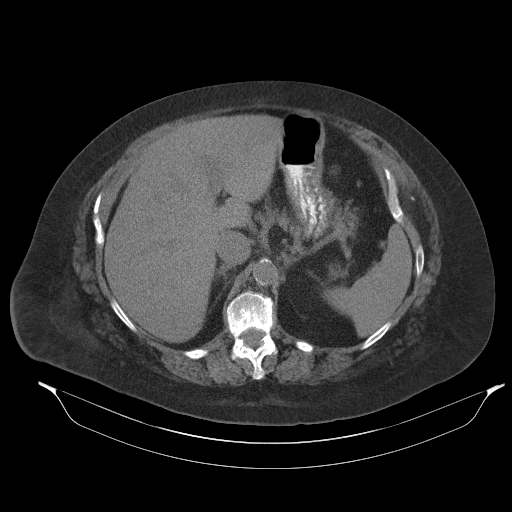
[im 68/87  soft-tissue]
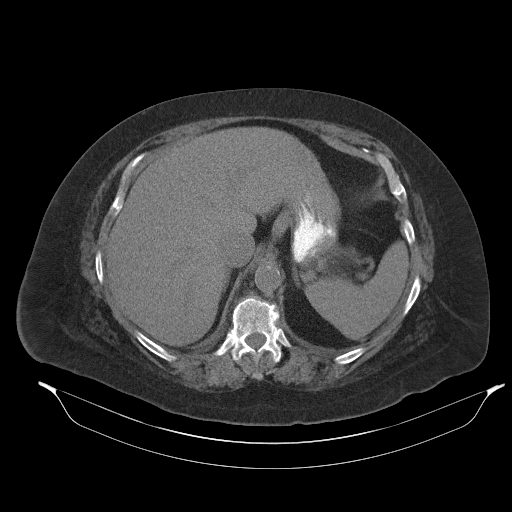
[im 68/87  lung]
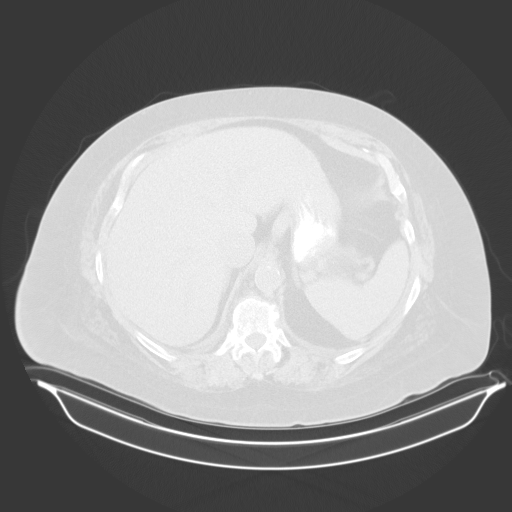
[im 73/87  soft-tissue]
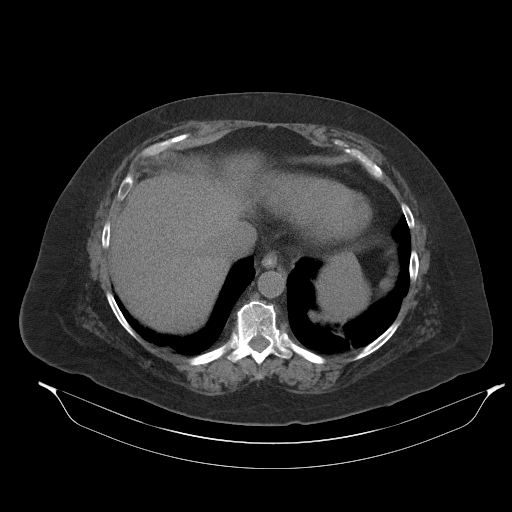
[im 73/87  lung]
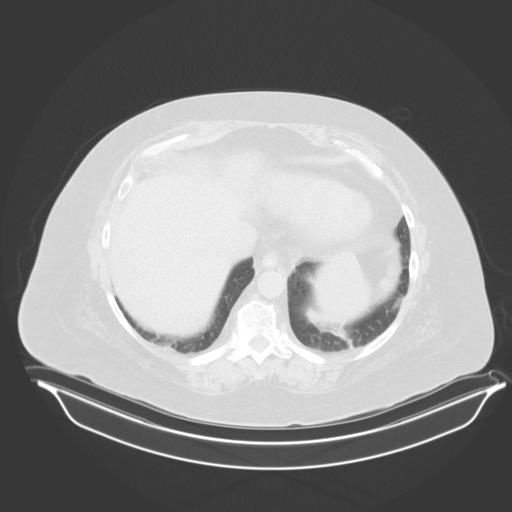
[im 77/87  lung]
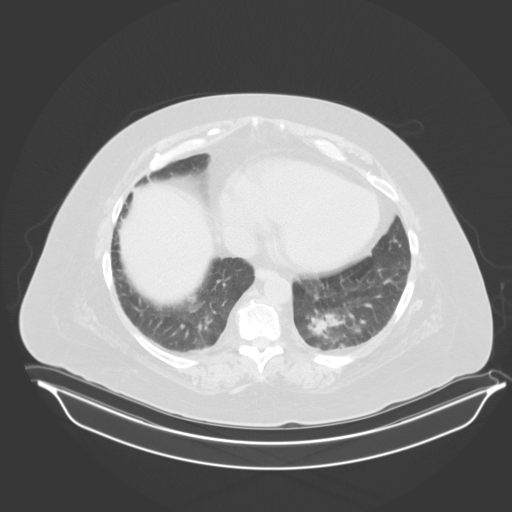
[im 82/87  soft-tissue]
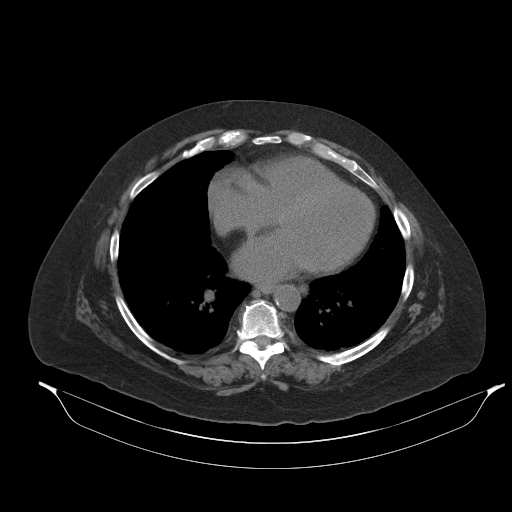
[im 82/87  lung]
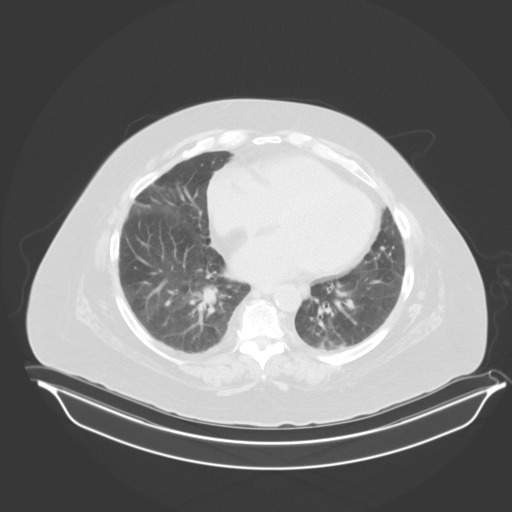

[14 of 32 positions shown; findings below may reference images not displayed]

FINDINGS: Lower chest: Hazy attenuation at the lower lungs may reflect
atelectasis, small airways disease or mild edema. Patchy
consolidation in the posterior left lower lobe, possible pneumonia.
Cardiomegaly.

Hepatobiliary: No focal liver abnormality is seen. Status post
cholecystectomy. No biliary dilatation.

Pancreas: Unremarkable. No pancreatic ductal dilatation or
surrounding inflammatory changes.

Spleen: Normal in size without focal abnormality.

Adrenals/Urinary Tract: Adrenal glands are within normal limits. No
hydronephrosis. 15 mm rim calcified lesion mid left kidney, no
change compared with prior ultrasound from 9846, therefore benign.
Bladder normal

Stomach/Bowel: Stomach is within normal limits. Appendix not well
seen but no right lower quadrant inflammatory process.. No evidence
of bowel wall thickening, distention, or inflammatory changes.

Vascular/Lymphatic: Moderate aortic atherosclerosis. No aneurysmal
dilatation. No significant adenopathy

Reproductive: Status post hysterectomy. No adnexal masses.

Other: Negative for free air or free fluid. Diffuse laxity/diastasis
of the rectus sheath with mild anterior bulging of mesenteric fat
and bowel.

Musculoskeletal: Degenerative changes of the lumbar spine. Age
indeterminate inferior endplate deformity at T12.
IMPRESSION: 1. Diffuse diastasis/anterior abdominal wall laxity with bulging of
intra-abdominal fat and small bowel. No evidence for incarcerated
hernia.
2. Partial consolidation at the left lung base may reflect
pneumonia.
3. 15 mm rim calcified lesion mid left kidney, no change compared to
ultrasound from 9846, therefore felt benign
4. Age indeterminate mild inferior endplate deformity at T12

## 2018-05-22 DIAGNOSIS — H4322 Crystalline deposits in vitreous body, left eye: Secondary | ICD-10-CM | POA: Diagnosis not present

## 2018-05-22 DIAGNOSIS — H33311 Horseshoe tear of retina without detachment, right eye: Secondary | ICD-10-CM | POA: Diagnosis not present

## 2018-05-22 DIAGNOSIS — E119 Type 2 diabetes mellitus without complications: Secondary | ICD-10-CM | POA: Diagnosis not present

## 2018-05-22 DIAGNOSIS — H43811 Vitreous degeneration, right eye: Secondary | ICD-10-CM | POA: Diagnosis not present

## 2018-05-22 DIAGNOSIS — Z961 Presence of intraocular lens: Secondary | ICD-10-CM | POA: Diagnosis not present

## 2018-05-22 DIAGNOSIS — G4733 Obstructive sleep apnea (adult) (pediatric): Secondary | ICD-10-CM | POA: Diagnosis not present

## 2018-05-22 DIAGNOSIS — H4311 Vitreous hemorrhage, right eye: Secondary | ICD-10-CM | POA: Diagnosis not present

## 2018-05-22 DIAGNOSIS — H1589 Other disorders of sclera: Secondary | ICD-10-CM | POA: Diagnosis not present

## 2018-06-04 DIAGNOSIS — H35371 Puckering of macula, right eye: Secondary | ICD-10-CM | POA: Diagnosis not present

## 2018-06-04 DIAGNOSIS — E119 Type 2 diabetes mellitus without complications: Secondary | ICD-10-CM | POA: Diagnosis not present

## 2018-06-04 DIAGNOSIS — H4311 Vitreous hemorrhage, right eye: Secondary | ICD-10-CM | POA: Diagnosis not present

## 2018-06-04 DIAGNOSIS — H35363 Drusen (degenerative) of macula, bilateral: Secondary | ICD-10-CM | POA: Diagnosis not present

## 2018-06-04 DIAGNOSIS — G4733 Obstructive sleep apnea (adult) (pediatric): Secondary | ICD-10-CM | POA: Diagnosis not present

## 2018-06-04 DIAGNOSIS — H43811 Vitreous degeneration, right eye: Secondary | ICD-10-CM | POA: Diagnosis not present

## 2018-06-04 DIAGNOSIS — H4322 Crystalline deposits in vitreous body, left eye: Secondary | ICD-10-CM | POA: Diagnosis not present

## 2018-06-04 DIAGNOSIS — Z961 Presence of intraocular lens: Secondary | ICD-10-CM | POA: Diagnosis not present

## 2018-06-04 DIAGNOSIS — H33311 Horseshoe tear of retina without detachment, right eye: Secondary | ICD-10-CM | POA: Diagnosis not present

## 2018-06-10 DIAGNOSIS — I440 Atrioventricular block, first degree: Secondary | ICD-10-CM | POA: Diagnosis not present

## 2018-06-10 DIAGNOSIS — E785 Hyperlipidemia, unspecified: Secondary | ICD-10-CM | POA: Diagnosis not present

## 2018-06-10 DIAGNOSIS — R0609 Other forms of dyspnea: Secondary | ICD-10-CM | POA: Diagnosis not present

## 2018-06-10 DIAGNOSIS — R9431 Abnormal electrocardiogram [ECG] [EKG]: Secondary | ICD-10-CM | POA: Diagnosis not present

## 2018-06-10 DIAGNOSIS — I10 Essential (primary) hypertension: Secondary | ICD-10-CM | POA: Diagnosis not present

## 2018-06-10 DIAGNOSIS — R7301 Impaired fasting glucose: Secondary | ICD-10-CM | POA: Diagnosis not present

## 2018-06-16 DIAGNOSIS — Z853 Personal history of malignant neoplasm of breast: Secondary | ICD-10-CM | POA: Diagnosis not present

## 2018-06-16 DIAGNOSIS — Z96612 Presence of left artificial shoulder joint: Secondary | ICD-10-CM | POA: Diagnosis not present

## 2018-06-16 DIAGNOSIS — I10 Essential (primary) hypertension: Secondary | ICD-10-CM | POA: Diagnosis not present

## 2018-06-16 DIAGNOSIS — E785 Hyperlipidemia, unspecified: Secondary | ICD-10-CM | POA: Diagnosis not present

## 2018-06-16 DIAGNOSIS — I517 Cardiomegaly: Secondary | ICD-10-CM | POA: Diagnosis not present

## 2018-06-16 DIAGNOSIS — M4854XA Collapsed vertebra, not elsewhere classified, thoracic region, initial encounter for fracture: Secondary | ICD-10-CM | POA: Diagnosis not present

## 2018-06-16 DIAGNOSIS — R0600 Dyspnea, unspecified: Secondary | ICD-10-CM | POA: Diagnosis not present

## 2018-06-16 DIAGNOSIS — R7301 Impaired fasting glucose: Secondary | ICD-10-CM | POA: Diagnosis not present

## 2018-06-20 DIAGNOSIS — I5030 Unspecified diastolic (congestive) heart failure: Secondary | ICD-10-CM | POA: Diagnosis not present

## 2018-06-20 DIAGNOSIS — N184 Chronic kidney disease, stage 4 (severe): Secondary | ICD-10-CM | POA: Diagnosis not present

## 2018-06-20 DIAGNOSIS — R0989 Other specified symptoms and signs involving the circulatory and respiratory systems: Secondary | ICD-10-CM | POA: Diagnosis not present

## 2018-06-26 DIAGNOSIS — G4733 Obstructive sleep apnea (adult) (pediatric): Secondary | ICD-10-CM | POA: Diagnosis not present

## 2018-07-10 DIAGNOSIS — I5032 Chronic diastolic (congestive) heart failure: Secondary | ICD-10-CM | POA: Diagnosis not present

## 2018-07-10 DIAGNOSIS — N185 Chronic kidney disease, stage 5: Secondary | ICD-10-CM | POA: Diagnosis not present

## 2018-07-10 DIAGNOSIS — D649 Anemia, unspecified: Secondary | ICD-10-CM | POA: Diagnosis not present

## 2018-07-10 DIAGNOSIS — I10 Essential (primary) hypertension: Secondary | ICD-10-CM | POA: Diagnosis not present

## 2018-07-16 DIAGNOSIS — R0989 Other specified symptoms and signs involving the circulatory and respiratory systems: Secondary | ICD-10-CM | POA: Diagnosis not present

## 2018-07-16 DIAGNOSIS — I6523 Occlusion and stenosis of bilateral carotid arteries: Secondary | ICD-10-CM | POA: Diagnosis not present

## 2018-07-16 DIAGNOSIS — I5030 Unspecified diastolic (congestive) heart failure: Secondary | ICD-10-CM | POA: Diagnosis not present

## 2018-08-28 DIAGNOSIS — R05 Cough: Secondary | ICD-10-CM | POA: Diagnosis not present

## 2018-08-28 DIAGNOSIS — R0602 Shortness of breath: Secondary | ICD-10-CM | POA: Diagnosis not present

## 2018-08-28 DIAGNOSIS — R062 Wheezing: Secondary | ICD-10-CM | POA: Diagnosis not present

## 2018-08-28 DIAGNOSIS — R635 Abnormal weight gain: Secondary | ICD-10-CM | POA: Diagnosis not present

## 2018-08-28 DIAGNOSIS — N184 Chronic kidney disease, stage 4 (severe): Secondary | ICD-10-CM | POA: Diagnosis not present

## 2018-08-28 DIAGNOSIS — I5030 Unspecified diastolic (congestive) heart failure: Secondary | ICD-10-CM | POA: Diagnosis not present

## 2018-09-02 DIAGNOSIS — R05 Cough: Secondary | ICD-10-CM | POA: Diagnosis not present

## 2018-09-02 DIAGNOSIS — I5030 Unspecified diastolic (congestive) heart failure: Secondary | ICD-10-CM | POA: Diagnosis not present

## 2018-09-02 DIAGNOSIS — R0602 Shortness of breath: Secondary | ICD-10-CM | POA: Diagnosis not present

## 2018-09-02 DIAGNOSIS — N184 Chronic kidney disease, stage 4 (severe): Secondary | ICD-10-CM | POA: Diagnosis not present

## 2018-09-23 DIAGNOSIS — I129 Hypertensive chronic kidney disease with stage 1 through stage 4 chronic kidney disease, or unspecified chronic kidney disease: Secondary | ICD-10-CM | POA: Diagnosis not present

## 2018-09-23 DIAGNOSIS — D638 Anemia in other chronic diseases classified elsewhere: Secondary | ICD-10-CM | POA: Diagnosis not present

## 2018-09-23 DIAGNOSIS — G4733 Obstructive sleep apnea (adult) (pediatric): Secondary | ICD-10-CM | POA: Diagnosis not present

## 2018-09-23 DIAGNOSIS — I4891 Unspecified atrial fibrillation: Secondary | ICD-10-CM | POA: Diagnosis not present

## 2018-09-23 DIAGNOSIS — E785 Hyperlipidemia, unspecified: Secondary | ICD-10-CM | POA: Diagnosis not present

## 2018-09-23 DIAGNOSIS — I480 Paroxysmal atrial fibrillation: Secondary | ICD-10-CM | POA: Diagnosis not present

## 2018-09-23 DIAGNOSIS — N184 Chronic kidney disease, stage 4 (severe): Secondary | ICD-10-CM | POA: Diagnosis not present

## 2018-09-23 DIAGNOSIS — Z7901 Long term (current) use of anticoagulants: Secondary | ICD-10-CM | POA: Diagnosis not present

## 2018-09-23 DIAGNOSIS — N189 Chronic kidney disease, unspecified: Secondary | ICD-10-CM | POA: Diagnosis not present

## 2018-09-23 DIAGNOSIS — E039 Hypothyroidism, unspecified: Secondary | ICD-10-CM | POA: Diagnosis not present

## 2018-10-01 DIAGNOSIS — G4733 Obstructive sleep apnea (adult) (pediatric): Secondary | ICD-10-CM | POA: Diagnosis not present

## 2018-10-17 DIAGNOSIS — R7301 Impaired fasting glucose: Secondary | ICD-10-CM | POA: Diagnosis not present

## 2018-10-17 DIAGNOSIS — I480 Paroxysmal atrial fibrillation: Secondary | ICD-10-CM | POA: Diagnosis not present

## 2018-10-17 DIAGNOSIS — I10 Essential (primary) hypertension: Secondary | ICD-10-CM | POA: Diagnosis not present

## 2018-10-17 DIAGNOSIS — M25552 Pain in left hip: Secondary | ICD-10-CM | POA: Diagnosis not present

## 2018-10-17 DIAGNOSIS — Z6841 Body Mass Index (BMI) 40.0 and over, adult: Secondary | ICD-10-CM | POA: Diagnosis not present

## 2018-10-17 DIAGNOSIS — M25551 Pain in right hip: Secondary | ICD-10-CM | POA: Diagnosis not present

## 2018-10-17 DIAGNOSIS — Z87891 Personal history of nicotine dependence: Secondary | ICD-10-CM | POA: Diagnosis not present

## 2018-10-29 DIAGNOSIS — K439 Ventral hernia without obstruction or gangrene: Secondary | ICD-10-CM | POA: Diagnosis not present

## 2018-10-29 DIAGNOSIS — Z87891 Personal history of nicotine dependence: Secondary | ICD-10-CM | POA: Diagnosis not present

## 2018-10-29 DIAGNOSIS — M6208 Separation of muscle (nontraumatic), other site: Secondary | ICD-10-CM | POA: Diagnosis not present

## 2018-11-03 DIAGNOSIS — N184 Chronic kidney disease, stage 4 (severe): Secondary | ICD-10-CM | POA: Diagnosis not present

## 2018-11-03 DIAGNOSIS — I10 Essential (primary) hypertension: Secondary | ICD-10-CM | POA: Diagnosis not present

## 2018-11-03 DIAGNOSIS — D631 Anemia in chronic kidney disease: Secondary | ICD-10-CM | POA: Diagnosis not present

## 2018-12-24 DIAGNOSIS — R4781 Slurred speech: Secondary | ICD-10-CM | POA: Diagnosis not present

## 2018-12-24 DIAGNOSIS — N184 Chronic kidney disease, stage 4 (severe): Secondary | ICD-10-CM | POA: Diagnosis not present

## 2018-12-24 DIAGNOSIS — R001 Bradycardia, unspecified: Secondary | ICD-10-CM | POA: Diagnosis not present

## 2018-12-24 DIAGNOSIS — R7989 Other specified abnormal findings of blood chemistry: Secondary | ICD-10-CM | POA: Diagnosis not present

## 2018-12-24 DIAGNOSIS — F40240 Claustrophobia: Secondary | ICD-10-CM | POA: Diagnosis not present

## 2018-12-24 DIAGNOSIS — I472 Ventricular tachycardia: Secondary | ICD-10-CM | POA: Diagnosis not present

## 2018-12-24 DIAGNOSIS — I9589 Other hypotension: Secondary | ICD-10-CM | POA: Diagnosis not present

## 2018-12-24 DIAGNOSIS — I440 Atrioventricular block, first degree: Secondary | ICD-10-CM | POA: Diagnosis not present

## 2018-12-24 DIAGNOSIS — Z6841 Body Mass Index (BMI) 40.0 and over, adult: Secondary | ICD-10-CM | POA: Diagnosis not present

## 2018-12-24 DIAGNOSIS — R443 Hallucinations, unspecified: Secondary | ICD-10-CM | POA: Diagnosis not present

## 2018-12-24 DIAGNOSIS — T428X5A Adverse effect of antiparkinsonism drugs and other central muscle-tone depressants, initial encounter: Secondary | ICD-10-CM | POA: Diagnosis not present

## 2018-12-24 DIAGNOSIS — R4701 Aphasia: Secondary | ICD-10-CM | POA: Diagnosis not present

## 2018-12-24 DIAGNOSIS — E669 Obesity, unspecified: Secondary | ICD-10-CM | POA: Diagnosis not present

## 2018-12-24 DIAGNOSIS — Z87891 Personal history of nicotine dependence: Secondary | ICD-10-CM | POA: Diagnosis not present

## 2018-12-24 DIAGNOSIS — I5030 Unspecified diastolic (congestive) heart failure: Secondary | ICD-10-CM | POA: Diagnosis not present

## 2018-12-24 DIAGNOSIS — Z7901 Long term (current) use of anticoagulants: Secondary | ICD-10-CM | POA: Diagnosis not present

## 2018-12-24 DIAGNOSIS — I509 Heart failure, unspecified: Secondary | ICD-10-CM | POA: Diagnosis not present

## 2018-12-24 DIAGNOSIS — Z9989 Dependence on other enabling machines and devices: Secondary | ICD-10-CM | POA: Diagnosis not present

## 2018-12-24 DIAGNOSIS — I483 Typical atrial flutter: Secondary | ICD-10-CM | POA: Diagnosis not present

## 2018-12-24 DIAGNOSIS — D631 Anemia in chronic kidney disease: Secondary | ICD-10-CM | POA: Diagnosis not present

## 2018-12-24 DIAGNOSIS — T404X5A Adverse effect of other synthetic narcotics, initial encounter: Secondary | ICD-10-CM | POA: Diagnosis not present

## 2018-12-24 DIAGNOSIS — I480 Paroxysmal atrial fibrillation: Secondary | ICD-10-CM | POA: Diagnosis not present

## 2018-12-24 DIAGNOSIS — R441 Visual hallucinations: Secondary | ICD-10-CM | POA: Diagnosis not present

## 2018-12-24 DIAGNOSIS — E039 Hypothyroidism, unspecified: Secondary | ICD-10-CM | POA: Diagnosis not present

## 2018-12-24 DIAGNOSIS — E785 Hyperlipidemia, unspecified: Secondary | ICD-10-CM | POA: Diagnosis not present

## 2018-12-24 DIAGNOSIS — T426X5A Adverse effect of other antiepileptic and sedative-hypnotic drugs, initial encounter: Secondary | ICD-10-CM | POA: Diagnosis not present

## 2018-12-24 DIAGNOSIS — E1122 Type 2 diabetes mellitus with diabetic chronic kidney disease: Secondary | ICD-10-CM | POA: Diagnosis not present

## 2018-12-24 DIAGNOSIS — I129 Hypertensive chronic kidney disease with stage 1 through stage 4 chronic kidney disease, or unspecified chronic kidney disease: Secondary | ICD-10-CM | POA: Diagnosis not present

## 2018-12-24 DIAGNOSIS — I482 Chronic atrial fibrillation, unspecified: Secondary | ICD-10-CM | POA: Diagnosis not present

## 2018-12-24 DIAGNOSIS — I110 Hypertensive heart disease with heart failure: Secondary | ICD-10-CM | POA: Diagnosis not present

## 2018-12-24 DIAGNOSIS — G4733 Obstructive sleep apnea (adult) (pediatric): Secondary | ICD-10-CM | POA: Diagnosis not present

## 2018-12-24 DIAGNOSIS — N186 End stage renal disease: Secondary | ICD-10-CM | POA: Diagnosis not present

## 2018-12-24 DIAGNOSIS — I4891 Unspecified atrial fibrillation: Secondary | ICD-10-CM | POA: Diagnosis not present

## 2018-12-24 DIAGNOSIS — E8779 Other fluid overload: Secondary | ICD-10-CM | POA: Diagnosis not present

## 2018-12-24 DIAGNOSIS — R0602 Shortness of breath: Secondary | ICD-10-CM | POA: Diagnosis not present

## 2019-01-06 DIAGNOSIS — N189 Chronic kidney disease, unspecified: Secondary | ICD-10-CM | POA: Diagnosis not present

## 2019-01-06 DIAGNOSIS — Z79899 Other long term (current) drug therapy: Secondary | ICD-10-CM | POA: Diagnosis not present

## 2019-01-06 DIAGNOSIS — M79671 Pain in right foot: Secondary | ICD-10-CM | POA: Diagnosis not present

## 2019-01-06 DIAGNOSIS — M79672 Pain in left foot: Secondary | ICD-10-CM | POA: Diagnosis not present

## 2019-01-06 DIAGNOSIS — Z09 Encounter for follow-up examination after completed treatment for conditions other than malignant neoplasm: Secondary | ICD-10-CM | POA: Diagnosis not present

## 2019-01-08 DIAGNOSIS — I129 Hypertensive chronic kidney disease with stage 1 through stage 4 chronic kidney disease, or unspecified chronic kidney disease: Secondary | ICD-10-CM | POA: Diagnosis not present

## 2019-01-08 DIAGNOSIS — I447 Left bundle-branch block, unspecified: Secondary | ICD-10-CM | POA: Diagnosis not present

## 2019-01-08 DIAGNOSIS — I480 Paroxysmal atrial fibrillation: Secondary | ICD-10-CM | POA: Diagnosis not present

## 2019-01-08 DIAGNOSIS — D638 Anemia in other chronic diseases classified elsewhere: Secondary | ICD-10-CM | POA: Diagnosis not present

## 2019-01-08 DIAGNOSIS — I5030 Unspecified diastolic (congestive) heart failure: Secondary | ICD-10-CM | POA: Diagnosis not present

## 2019-01-08 DIAGNOSIS — N184 Chronic kidney disease, stage 4 (severe): Secondary | ICD-10-CM | POA: Diagnosis not present

## 2019-01-08 DIAGNOSIS — I440 Atrioventricular block, first degree: Secondary | ICD-10-CM | POA: Diagnosis not present

## 2019-01-14 DIAGNOSIS — M25512 Pain in left shoulder: Secondary | ICD-10-CM | POA: Diagnosis not present

## 2019-01-22 DIAGNOSIS — M19071 Primary osteoarthritis, right ankle and foot: Secondary | ICD-10-CM | POA: Diagnosis not present

## 2019-01-22 DIAGNOSIS — M79671 Pain in right foot: Secondary | ICD-10-CM | POA: Diagnosis not present

## 2019-01-22 DIAGNOSIS — M216X2 Other acquired deformities of left foot: Secondary | ICD-10-CM | POA: Diagnosis not present

## 2019-01-22 DIAGNOSIS — M79672 Pain in left foot: Secondary | ICD-10-CM | POA: Diagnosis not present

## 2019-01-22 DIAGNOSIS — M7732 Calcaneal spur, left foot: Secondary | ICD-10-CM | POA: Diagnosis not present

## 2019-01-22 DIAGNOSIS — M19072 Primary osteoarthritis, left ankle and foot: Secondary | ICD-10-CM | POA: Diagnosis not present

## 2019-01-22 DIAGNOSIS — M7731 Calcaneal spur, right foot: Secondary | ICD-10-CM | POA: Diagnosis not present

## 2019-01-23 DIAGNOSIS — R75 Inconclusive laboratory evidence of human immunodeficiency virus [HIV]: Secondary | ICD-10-CM | POA: Diagnosis not present

## 2019-01-23 DIAGNOSIS — E039 Hypothyroidism, unspecified: Secondary | ICD-10-CM | POA: Diagnosis not present

## 2019-01-23 DIAGNOSIS — N186 End stage renal disease: Secondary | ICD-10-CM | POA: Diagnosis not present

## 2019-01-23 DIAGNOSIS — J984 Other disorders of lung: Secondary | ICD-10-CM | POA: Diagnosis not present

## 2019-01-23 DIAGNOSIS — I700 Atherosclerosis of aorta: Secondary | ICD-10-CM | POA: Diagnosis not present

## 2019-01-23 DIAGNOSIS — D649 Anemia, unspecified: Secondary | ICD-10-CM | POA: Diagnosis not present

## 2019-01-23 DIAGNOSIS — R0602 Shortness of breath: Secondary | ICD-10-CM | POA: Diagnosis not present

## 2019-01-27 DIAGNOSIS — G4733 Obstructive sleep apnea (adult) (pediatric): Secondary | ICD-10-CM | POA: Diagnosis not present

## 2019-01-30 DIAGNOSIS — I340 Nonrheumatic mitral (valve) insufficiency: Secondary | ICD-10-CM | POA: Diagnosis not present

## 2019-01-30 DIAGNOSIS — I517 Cardiomegaly: Secondary | ICD-10-CM | POA: Diagnosis not present

## 2019-02-03 DIAGNOSIS — Z0000 Encounter for general adult medical examination without abnormal findings: Secondary | ICD-10-CM | POA: Diagnosis not present

## 2019-02-03 DIAGNOSIS — R404 Transient alteration of awareness: Secondary | ICD-10-CM | POA: Diagnosis not present

## 2019-02-03 DIAGNOSIS — M8589 Other specified disorders of bone density and structure, multiple sites: Secondary | ICD-10-CM | POA: Diagnosis not present

## 2019-02-05 DIAGNOSIS — I447 Left bundle-branch block, unspecified: Secondary | ICD-10-CM | POA: Diagnosis not present

## 2019-02-05 DIAGNOSIS — R9431 Abnormal electrocardiogram [ECG] [EKG]: Secondary | ICD-10-CM | POA: Diagnosis not present

## 2019-02-17 DIAGNOSIS — I480 Paroxysmal atrial fibrillation: Secondary | ICD-10-CM | POA: Diagnosis not present

## 2019-02-17 DIAGNOSIS — G4733 Obstructive sleep apnea (adult) (pediatric): Secondary | ICD-10-CM | POA: Diagnosis not present

## 2019-02-17 DIAGNOSIS — I5030 Unspecified diastolic (congestive) heart failure: Secondary | ICD-10-CM | POA: Diagnosis not present

## 2019-02-18 DIAGNOSIS — H5201 Hypermetropia, right eye: Secondary | ICD-10-CM | POA: Diagnosis not present

## 2019-02-18 DIAGNOSIS — Z961 Presence of intraocular lens: Secondary | ICD-10-CM | POA: Diagnosis not present

## 2019-02-18 DIAGNOSIS — H4322 Crystalline deposits in vitreous body, left eye: Secondary | ICD-10-CM | POA: Diagnosis not present

## 2019-02-18 DIAGNOSIS — G4733 Obstructive sleep apnea (adult) (pediatric): Secondary | ICD-10-CM | POA: Diagnosis not present

## 2019-02-18 DIAGNOSIS — E119 Type 2 diabetes mellitus without complications: Secondary | ICD-10-CM | POA: Diagnosis not present

## 2019-02-18 DIAGNOSIS — H04123 Dry eye syndrome of bilateral lacrimal glands: Secondary | ICD-10-CM | POA: Diagnosis not present

## 2019-02-18 DIAGNOSIS — H4311 Vitreous hemorrhage, right eye: Secondary | ICD-10-CM | POA: Diagnosis not present

## 2019-02-18 DIAGNOSIS — H33311 Horseshoe tear of retina without detachment, right eye: Secondary | ICD-10-CM | POA: Diagnosis not present

## 2019-02-18 DIAGNOSIS — H53032 Strabismic amblyopia, left eye: Secondary | ICD-10-CM | POA: Diagnosis not present

## 2019-02-18 DIAGNOSIS — H43811 Vitreous degeneration, right eye: Secondary | ICD-10-CM | POA: Diagnosis not present

## 2019-02-18 DIAGNOSIS — H52223 Regular astigmatism, bilateral: Secondary | ICD-10-CM | POA: Diagnosis not present

## 2019-02-19 DIAGNOSIS — R809 Proteinuria, unspecified: Secondary | ICD-10-CM | POA: Diagnosis not present

## 2019-02-19 DIAGNOSIS — N184 Chronic kidney disease, stage 4 (severe): Secondary | ICD-10-CM | POA: Diagnosis not present

## 2019-02-19 DIAGNOSIS — I10 Essential (primary) hypertension: Secondary | ICD-10-CM | POA: Diagnosis not present

## 2019-02-24 DIAGNOSIS — I6521 Occlusion and stenosis of right carotid artery: Secondary | ICD-10-CM | POA: Diagnosis not present

## 2019-02-26 DIAGNOSIS — N184 Chronic kidney disease, stage 4 (severe): Secondary | ICD-10-CM | POA: Diagnosis not present

## 2019-02-26 DIAGNOSIS — D631 Anemia in chronic kidney disease: Secondary | ICD-10-CM | POA: Diagnosis not present

## 2019-02-26 DIAGNOSIS — D638 Anemia in other chronic diseases classified elsewhere: Secondary | ICD-10-CM | POA: Diagnosis not present

## 2019-02-26 DIAGNOSIS — I480 Paroxysmal atrial fibrillation: Secondary | ICD-10-CM | POA: Diagnosis not present

## 2019-02-26 DIAGNOSIS — I5030 Unspecified diastolic (congestive) heart failure: Secondary | ICD-10-CM | POA: Diagnosis not present

## 2019-02-26 DIAGNOSIS — I130 Hypertensive heart and chronic kidney disease with heart failure and stage 1 through stage 4 chronic kidney disease, or unspecified chronic kidney disease: Secondary | ICD-10-CM | POA: Diagnosis not present

## 2019-02-26 DIAGNOSIS — I447 Left bundle-branch block, unspecified: Secondary | ICD-10-CM | POA: Diagnosis not present

## 2019-03-25 DIAGNOSIS — Z6841 Body Mass Index (BMI) 40.0 and over, adult: Secondary | ICD-10-CM | POA: Diagnosis not present

## 2019-03-25 DIAGNOSIS — Z170 Estrogen receptor positive status [ER+]: Secondary | ICD-10-CM | POA: Diagnosis not present

## 2019-03-25 DIAGNOSIS — I480 Paroxysmal atrial fibrillation: Secondary | ICD-10-CM | POA: Diagnosis not present

## 2019-03-25 DIAGNOSIS — N184 Chronic kidney disease, stage 4 (severe): Secondary | ICD-10-CM | POA: Diagnosis not present

## 2019-03-25 DIAGNOSIS — N185 Chronic kidney disease, stage 5: Secondary | ICD-10-CM | POA: Diagnosis not present

## 2019-03-25 DIAGNOSIS — C50412 Malignant neoplasm of upper-outer quadrant of left female breast: Secondary | ICD-10-CM | POA: Diagnosis not present

## 2019-04-03 DIAGNOSIS — D631 Anemia in chronic kidney disease: Secondary | ICD-10-CM | POA: Diagnosis not present

## 2019-04-03 DIAGNOSIS — R042 Hemoptysis: Secondary | ICD-10-CM | POA: Diagnosis not present

## 2019-04-03 DIAGNOSIS — R05 Cough: Secondary | ICD-10-CM | POA: Diagnosis not present

## 2019-04-03 DIAGNOSIS — J189 Pneumonia, unspecified organism: Secondary | ICD-10-CM | POA: Diagnosis not present

## 2019-04-03 DIAGNOSIS — C189 Malignant neoplasm of colon, unspecified: Secondary | ICD-10-CM | POA: Diagnosis not present

## 2019-04-07 DIAGNOSIS — I517 Cardiomegaly: Secondary | ICD-10-CM | POA: Diagnosis not present

## 2019-04-07 DIAGNOSIS — Z8673 Personal history of transient ischemic attack (TIA), and cerebral infarction without residual deficits: Secondary | ICD-10-CM | POA: Diagnosis not present

## 2019-04-07 DIAGNOSIS — I5032 Chronic diastolic (congestive) heart failure: Secondary | ICD-10-CM | POA: Diagnosis not present

## 2019-04-07 DIAGNOSIS — I447 Left bundle-branch block, unspecified: Secondary | ICD-10-CM | POA: Diagnosis not present

## 2019-04-07 DIAGNOSIS — I509 Heart failure, unspecified: Secondary | ICD-10-CM | POA: Diagnosis not present

## 2019-04-07 DIAGNOSIS — I130 Hypertensive heart and chronic kidney disease with heart failure and stage 1 through stage 4 chronic kidney disease, or unspecified chronic kidney disease: Secondary | ICD-10-CM | POA: Diagnosis not present

## 2019-04-07 DIAGNOSIS — J449 Chronic obstructive pulmonary disease, unspecified: Secondary | ICD-10-CM | POA: Diagnosis not present

## 2019-04-07 DIAGNOSIS — Z9013 Acquired absence of bilateral breasts and nipples: Secondary | ICD-10-CM | POA: Diagnosis not present

## 2019-04-07 DIAGNOSIS — Z20828 Contact with and (suspected) exposure to other viral communicable diseases: Secondary | ICD-10-CM | POA: Diagnosis not present

## 2019-04-07 DIAGNOSIS — J440 Chronic obstructive pulmonary disease with acute lower respiratory infection: Secondary | ICD-10-CM | POA: Diagnosis not present

## 2019-04-07 DIAGNOSIS — R0902 Hypoxemia: Secondary | ICD-10-CM | POA: Diagnosis not present

## 2019-04-07 DIAGNOSIS — Z7901 Long term (current) use of anticoagulants: Secondary | ICD-10-CM | POA: Diagnosis not present

## 2019-04-07 DIAGNOSIS — E785 Hyperlipidemia, unspecified: Secondary | ICD-10-CM | POA: Diagnosis not present

## 2019-04-07 DIAGNOSIS — D649 Anemia, unspecified: Secondary | ICD-10-CM | POA: Diagnosis not present

## 2019-04-07 DIAGNOSIS — Z853 Personal history of malignant neoplasm of breast: Secondary | ICD-10-CM | POA: Diagnosis not present

## 2019-04-07 DIAGNOSIS — R778 Other specified abnormalities of plasma proteins: Secondary | ICD-10-CM | POA: Diagnosis not present

## 2019-04-07 DIAGNOSIS — Z79899 Other long term (current) drug therapy: Secondary | ICD-10-CM | POA: Diagnosis not present

## 2019-04-07 DIAGNOSIS — Z9989 Dependence on other enabling machines and devices: Secondary | ICD-10-CM | POA: Diagnosis not present

## 2019-04-07 DIAGNOSIS — E039 Hypothyroidism, unspecified: Secondary | ICD-10-CM | POA: Diagnosis not present

## 2019-04-07 DIAGNOSIS — R042 Hemoptysis: Secondary | ICD-10-CM | POA: Diagnosis not present

## 2019-04-07 DIAGNOSIS — I440 Atrioventricular block, first degree: Secondary | ICD-10-CM | POA: Diagnosis not present

## 2019-04-07 DIAGNOSIS — I5030 Unspecified diastolic (congestive) heart failure: Secondary | ICD-10-CM | POA: Diagnosis not present

## 2019-04-07 DIAGNOSIS — J9601 Acute respiratory failure with hypoxia: Secondary | ICD-10-CM | POA: Diagnosis not present

## 2019-04-07 DIAGNOSIS — N184 Chronic kidney disease, stage 4 (severe): Secondary | ICD-10-CM | POA: Diagnosis not present

## 2019-04-07 DIAGNOSIS — J181 Lobar pneumonia, unspecified organism: Secondary | ICD-10-CM | POA: Diagnosis not present

## 2019-04-07 DIAGNOSIS — R0602 Shortness of breath: Secondary | ICD-10-CM | POA: Diagnosis not present

## 2019-04-07 DIAGNOSIS — J189 Pneumonia, unspecified organism: Secondary | ICD-10-CM | POA: Diagnosis not present

## 2019-04-07 DIAGNOSIS — I110 Hypertensive heart disease with heart failure: Secondary | ICD-10-CM | POA: Diagnosis not present

## 2019-04-07 DIAGNOSIS — D631 Anemia in chronic kidney disease: Secondary | ICD-10-CM | POA: Diagnosis not present

## 2019-04-07 DIAGNOSIS — N185 Chronic kidney disease, stage 5: Secondary | ICD-10-CM | POA: Diagnosis not present

## 2019-04-07 DIAGNOSIS — J156 Pneumonia due to other aerobic Gram-negative bacteria: Secondary | ICD-10-CM | POA: Diagnosis not present

## 2019-04-07 DIAGNOSIS — I272 Pulmonary hypertension, unspecified: Secondary | ICD-10-CM | POA: Diagnosis not present

## 2019-04-07 DIAGNOSIS — Z87891 Personal history of nicotine dependence: Secondary | ICD-10-CM | POA: Diagnosis not present

## 2019-04-07 DIAGNOSIS — I480 Paroxysmal atrial fibrillation: Secondary | ICD-10-CM | POA: Diagnosis not present

## 2019-04-07 DIAGNOSIS — N179 Acute kidney failure, unspecified: Secondary | ICD-10-CM | POA: Diagnosis not present

## 2019-04-07 DIAGNOSIS — I4891 Unspecified atrial fibrillation: Secondary | ICD-10-CM | POA: Diagnosis not present

## 2019-04-07 DIAGNOSIS — G4733 Obstructive sleep apnea (adult) (pediatric): Secondary | ICD-10-CM | POA: Diagnosis not present

## 2019-04-07 DIAGNOSIS — I340 Nonrheumatic mitral (valve) insufficiency: Secondary | ICD-10-CM | POA: Diagnosis not present

## 2019-04-07 DIAGNOSIS — I132 Hypertensive heart and chronic kidney disease with heart failure and with stage 5 chronic kidney disease, or end stage renal disease: Secondary | ICD-10-CM | POA: Diagnosis not present

## 2019-04-07 DIAGNOSIS — R918 Other nonspecific abnormal finding of lung field: Secondary | ICD-10-CM | POA: Diagnosis not present

## 2019-04-07 DIAGNOSIS — R079 Chest pain, unspecified: Secondary | ICD-10-CM | POA: Diagnosis not present

## 2019-04-08 DIAGNOSIS — I447 Left bundle-branch block, unspecified: Secondary | ICD-10-CM | POA: Diagnosis not present

## 2019-04-08 DIAGNOSIS — I4891 Unspecified atrial fibrillation: Secondary | ICD-10-CM | POA: Diagnosis not present

## 2019-04-14 DIAGNOSIS — J4520 Mild intermittent asthma, uncomplicated: Secondary | ICD-10-CM | POA: Diagnosis not present

## 2019-04-14 DIAGNOSIS — G4733 Obstructive sleep apnea (adult) (pediatric): Secondary | ICD-10-CM | POA: Diagnosis not present

## 2019-04-14 DIAGNOSIS — J189 Pneumonia, unspecified organism: Secondary | ICD-10-CM | POA: Diagnosis not present

## 2019-04-14 DIAGNOSIS — I132 Hypertensive heart and chronic kidney disease with heart failure and with stage 5 chronic kidney disease, or end stage renal disease: Secondary | ICD-10-CM | POA: Diagnosis not present

## 2019-04-14 DIAGNOSIS — E785 Hyperlipidemia, unspecified: Secondary | ICD-10-CM | POA: Diagnosis not present

## 2019-04-14 DIAGNOSIS — I447 Left bundle-branch block, unspecified: Secondary | ICD-10-CM | POA: Diagnosis not present

## 2019-04-14 DIAGNOSIS — D631 Anemia in chronic kidney disease: Secondary | ICD-10-CM | POA: Diagnosis not present

## 2019-04-14 DIAGNOSIS — N185 Chronic kidney disease, stage 5: Secondary | ICD-10-CM | POA: Diagnosis not present

## 2019-04-14 DIAGNOSIS — M1990 Unspecified osteoarthritis, unspecified site: Secondary | ICD-10-CM | POA: Diagnosis not present

## 2019-04-14 DIAGNOSIS — I5030 Unspecified diastolic (congestive) heart failure: Secondary | ICD-10-CM | POA: Diagnosis not present

## 2019-04-14 DIAGNOSIS — I480 Paroxysmal atrial fibrillation: Secondary | ICD-10-CM | POA: Diagnosis not present

## 2019-04-14 DIAGNOSIS — E1122 Type 2 diabetes mellitus with diabetic chronic kidney disease: Secondary | ICD-10-CM | POA: Diagnosis not present

## 2019-04-14 DIAGNOSIS — I340 Nonrheumatic mitral (valve) insufficiency: Secondary | ICD-10-CM | POA: Diagnosis not present

## 2019-04-22 DIAGNOSIS — J189 Pneumonia, unspecified organism: Secondary | ICD-10-CM | POA: Diagnosis not present

## 2019-04-22 DIAGNOSIS — R042 Hemoptysis: Secondary | ICD-10-CM | POA: Diagnosis not present

## 2019-04-22 DIAGNOSIS — M79671 Pain in right foot: Secondary | ICD-10-CM | POA: Diagnosis not present

## 2019-04-22 DIAGNOSIS — K1230 Oral mucositis (ulcerative), unspecified: Secondary | ICD-10-CM | POA: Diagnosis not present

## 2019-04-22 DIAGNOSIS — M79672 Pain in left foot: Secondary | ICD-10-CM | POA: Diagnosis not present

## 2019-04-22 DIAGNOSIS — J9601 Acute respiratory failure with hypoxia: Secondary | ICD-10-CM | POA: Diagnosis not present

## 2019-04-23 DIAGNOSIS — I5030 Unspecified diastolic (congestive) heart failure: Secondary | ICD-10-CM | POA: Diagnosis not present

## 2019-04-24 DIAGNOSIS — N185 Chronic kidney disease, stage 5: Secondary | ICD-10-CM | POA: Diagnosis not present

## 2019-04-24 DIAGNOSIS — E1122 Type 2 diabetes mellitus with diabetic chronic kidney disease: Secondary | ICD-10-CM | POA: Diagnosis not present

## 2019-04-24 DIAGNOSIS — D631 Anemia in chronic kidney disease: Secondary | ICD-10-CM | POA: Diagnosis not present

## 2019-04-24 DIAGNOSIS — I480 Paroxysmal atrial fibrillation: Secondary | ICD-10-CM | POA: Diagnosis not present

## 2019-04-24 DIAGNOSIS — I5030 Unspecified diastolic (congestive) heart failure: Secondary | ICD-10-CM | POA: Diagnosis not present

## 2019-04-24 DIAGNOSIS — I132 Hypertensive heart and chronic kidney disease with heart failure and with stage 5 chronic kidney disease, or end stage renal disease: Secondary | ICD-10-CM | POA: Diagnosis not present

## 2019-04-24 DIAGNOSIS — E785 Hyperlipidemia, unspecified: Secondary | ICD-10-CM | POA: Diagnosis not present

## 2019-04-24 DIAGNOSIS — I447 Left bundle-branch block, unspecified: Secondary | ICD-10-CM | POA: Diagnosis not present

## 2019-04-24 DIAGNOSIS — G4733 Obstructive sleep apnea (adult) (pediatric): Secondary | ICD-10-CM | POA: Diagnosis not present

## 2019-04-24 DIAGNOSIS — J189 Pneumonia, unspecified organism: Secondary | ICD-10-CM | POA: Diagnosis not present

## 2019-04-24 DIAGNOSIS — J4520 Mild intermittent asthma, uncomplicated: Secondary | ICD-10-CM | POA: Diagnosis not present

## 2019-04-24 DIAGNOSIS — I340 Nonrheumatic mitral (valve) insufficiency: Secondary | ICD-10-CM | POA: Diagnosis not present

## 2019-04-24 DIAGNOSIS — M1990 Unspecified osteoarthritis, unspecified site: Secondary | ICD-10-CM | POA: Diagnosis not present

## 2019-04-28 DIAGNOSIS — D649 Anemia, unspecified: Secondary | ICD-10-CM | POA: Diagnosis not present

## 2019-04-28 DIAGNOSIS — N189 Chronic kidney disease, unspecified: Secondary | ICD-10-CM | POA: Diagnosis not present

## 2019-04-30 DIAGNOSIS — I5030 Unspecified diastolic (congestive) heart failure: Secondary | ICD-10-CM | POA: Diagnosis not present

## 2019-04-30 DIAGNOSIS — G4733 Obstructive sleep apnea (adult) (pediatric): Secondary | ICD-10-CM | POA: Diagnosis not present

## 2019-04-30 DIAGNOSIS — R05 Cough: Secondary | ICD-10-CM | POA: Diagnosis not present

## 2019-04-30 DIAGNOSIS — I480 Paroxysmal atrial fibrillation: Secondary | ICD-10-CM | POA: Diagnosis not present

## 2019-04-30 DIAGNOSIS — R042 Hemoptysis: Secondary | ICD-10-CM | POA: Diagnosis not present

## 2019-05-05 DIAGNOSIS — R062 Wheezing: Secondary | ICD-10-CM | POA: Diagnosis not present

## 2019-05-05 DIAGNOSIS — R05 Cough: Secondary | ICD-10-CM | POA: Diagnosis not present

## 2019-05-05 DIAGNOSIS — J9600 Acute respiratory failure, unspecified whether with hypoxia or hypercapnia: Secondary | ICD-10-CM | POA: Diagnosis not present

## 2019-05-06 DIAGNOSIS — R9431 Abnormal electrocardiogram [ECG] [EKG]: Secondary | ICD-10-CM | POA: Diagnosis not present

## 2019-05-06 DIAGNOSIS — Z8679 Personal history of other diseases of the circulatory system: Secondary | ICD-10-CM | POA: Diagnosis not present

## 2019-05-06 DIAGNOSIS — I700 Atherosclerosis of aorta: Secondary | ICD-10-CM | POA: Diagnosis not present

## 2019-05-06 DIAGNOSIS — Z87891 Personal history of nicotine dependence: Secondary | ICD-10-CM | POA: Diagnosis not present

## 2019-05-06 DIAGNOSIS — R0602 Shortness of breath: Secondary | ICD-10-CM | POA: Diagnosis not present

## 2019-05-06 DIAGNOSIS — Z20822 Contact with and (suspected) exposure to covid-19: Secondary | ICD-10-CM | POA: Diagnosis not present

## 2019-05-06 DIAGNOSIS — R918 Other nonspecific abnormal finding of lung field: Secondary | ICD-10-CM | POA: Diagnosis not present

## 2019-05-06 DIAGNOSIS — R600 Localized edema: Secondary | ICD-10-CM | POA: Diagnosis not present

## 2019-05-06 DIAGNOSIS — E669 Obesity, unspecified: Secondary | ICD-10-CM | POA: Diagnosis not present

## 2019-05-06 DIAGNOSIS — R7989 Other specified abnormal findings of blood chemistry: Secondary | ICD-10-CM | POA: Diagnosis not present

## 2019-05-06 DIAGNOSIS — I440 Atrioventricular block, first degree: Secondary | ICD-10-CM | POA: Diagnosis not present

## 2019-05-06 DIAGNOSIS — R778 Other specified abnormalities of plasma proteins: Secondary | ICD-10-CM | POA: Diagnosis not present

## 2019-05-06 DIAGNOSIS — J189 Pneumonia, unspecified organism: Secondary | ICD-10-CM | POA: Diagnosis not present

## 2019-05-06 DIAGNOSIS — Z6841 Body Mass Index (BMI) 40.0 and over, adult: Secondary | ICD-10-CM | POA: Diagnosis not present

## 2019-05-06 DIAGNOSIS — I447 Left bundle-branch block, unspecified: Secondary | ICD-10-CM | POA: Diagnosis not present

## 2019-05-06 DIAGNOSIS — I517 Cardiomegaly: Secondary | ICD-10-CM | POA: Diagnosis not present

## 2019-05-06 DIAGNOSIS — Z8639 Personal history of other endocrine, nutritional and metabolic disease: Secondary | ICD-10-CM | POA: Diagnosis not present

## 2019-05-06 DIAGNOSIS — Z87448 Personal history of other diseases of urinary system: Secondary | ICD-10-CM | POA: Diagnosis not present

## 2019-05-06 DIAGNOSIS — I493 Ventricular premature depolarization: Secondary | ICD-10-CM | POA: Diagnosis not present

## 2019-05-07 DIAGNOSIS — R9431 Abnormal electrocardiogram [ECG] [EKG]: Secondary | ICD-10-CM | POA: Diagnosis not present

## 2019-05-07 DIAGNOSIS — I493 Ventricular premature depolarization: Secondary | ICD-10-CM | POA: Diagnosis not present

## 2019-05-07 DIAGNOSIS — I447 Left bundle-branch block, unspecified: Secondary | ICD-10-CM | POA: Diagnosis not present

## 2019-05-07 DIAGNOSIS — I440 Atrioventricular block, first degree: Secondary | ICD-10-CM | POA: Diagnosis not present

## 2019-05-08 DIAGNOSIS — Z20822 Contact with and (suspected) exposure to covid-19: Secondary | ICD-10-CM | POA: Diagnosis not present

## 2019-05-08 DIAGNOSIS — Z01812 Encounter for preprocedural laboratory examination: Secondary | ICD-10-CM | POA: Diagnosis not present

## 2019-05-08 DIAGNOSIS — N184 Chronic kidney disease, stage 4 (severe): Secondary | ICD-10-CM | POA: Diagnosis not present

## 2019-05-14 DIAGNOSIS — Z9981 Dependence on supplemental oxygen: Secondary | ICD-10-CM | POA: Diagnosis not present

## 2019-05-14 DIAGNOSIS — N189 Chronic kidney disease, unspecified: Secondary | ICD-10-CM | POA: Diagnosis not present

## 2019-05-14 DIAGNOSIS — R278 Other lack of coordination: Secondary | ICD-10-CM | POA: Diagnosis not present

## 2019-05-14 DIAGNOSIS — J9600 Acute respiratory failure, unspecified whether with hypoxia or hypercapnia: Secondary | ICD-10-CM | POA: Diagnosis not present

## 2019-05-14 DIAGNOSIS — G4733 Obstructive sleep apnea (adult) (pediatric): Secondary | ICD-10-CM | POA: Diagnosis not present

## 2019-05-14 DIAGNOSIS — I959 Hypotension, unspecified: Secondary | ICD-10-CM | POA: Diagnosis not present

## 2019-05-14 DIAGNOSIS — E785 Hyperlipidemia, unspecified: Secondary | ICD-10-CM | POA: Diagnosis not present

## 2019-05-14 DIAGNOSIS — J440 Chronic obstructive pulmonary disease with acute lower respiratory infection: Secondary | ICD-10-CM | POA: Diagnosis not present

## 2019-05-14 DIAGNOSIS — R918 Other nonspecific abnormal finding of lung field: Secondary | ICD-10-CM | POA: Diagnosis not present

## 2019-05-14 DIAGNOSIS — I10 Essential (primary) hypertension: Secondary | ICD-10-CM | POA: Diagnosis not present

## 2019-05-14 DIAGNOSIS — N179 Acute kidney failure, unspecified: Secondary | ICD-10-CM | POA: Diagnosis not present

## 2019-05-14 DIAGNOSIS — D631 Anemia in chronic kidney disease: Secondary | ICD-10-CM | POA: Diagnosis not present

## 2019-05-14 DIAGNOSIS — M6281 Muscle weakness (generalized): Secondary | ICD-10-CM | POA: Diagnosis not present

## 2019-05-14 DIAGNOSIS — I5032 Chronic diastolic (congestive) heart failure: Secondary | ICD-10-CM | POA: Diagnosis not present

## 2019-05-14 DIAGNOSIS — Z7901 Long term (current) use of anticoagulants: Secondary | ICD-10-CM | POA: Diagnosis not present

## 2019-05-14 DIAGNOSIS — Z9013 Acquired absence of bilateral breasts and nipples: Secondary | ICD-10-CM | POA: Diagnosis not present

## 2019-05-14 DIAGNOSIS — Z20822 Contact with and (suspected) exposure to covid-19: Secondary | ICD-10-CM | POA: Diagnosis not present

## 2019-05-14 DIAGNOSIS — J441 Chronic obstructive pulmonary disease with (acute) exacerbation: Secondary | ICD-10-CM | POA: Diagnosis not present

## 2019-05-14 DIAGNOSIS — J189 Pneumonia, unspecified organism: Secondary | ICD-10-CM | POA: Diagnosis not present

## 2019-05-14 DIAGNOSIS — N185 Chronic kidney disease, stage 5: Secondary | ICD-10-CM | POA: Diagnosis not present

## 2019-05-14 DIAGNOSIS — D509 Iron deficiency anemia, unspecified: Secondary | ICD-10-CM | POA: Diagnosis not present

## 2019-05-14 DIAGNOSIS — R9389 Abnormal findings on diagnostic imaging of other specified body structures: Secondary | ICD-10-CM | POA: Diagnosis not present

## 2019-05-14 DIAGNOSIS — I480 Paroxysmal atrial fibrillation: Secondary | ICD-10-CM | POA: Diagnosis not present

## 2019-05-14 DIAGNOSIS — D649 Anemia, unspecified: Secondary | ICD-10-CM | POA: Diagnosis not present

## 2019-05-14 DIAGNOSIS — I130 Hypertensive heart and chronic kidney disease with heart failure and stage 1 through stage 4 chronic kidney disease, or unspecified chronic kidney disease: Secondary | ICD-10-CM | POA: Diagnosis not present

## 2019-05-14 DIAGNOSIS — R2681 Unsteadiness on feet: Secondary | ICD-10-CM | POA: Diagnosis not present

## 2019-05-14 DIAGNOSIS — R069 Unspecified abnormalities of breathing: Secondary | ICD-10-CM | POA: Diagnosis not present

## 2019-05-14 DIAGNOSIS — I482 Chronic atrial fibrillation, unspecified: Secondary | ICD-10-CM | POA: Diagnosis not present

## 2019-05-14 DIAGNOSIS — N183 Chronic kidney disease, stage 3 unspecified: Secondary | ICD-10-CM | POA: Diagnosis not present

## 2019-05-14 DIAGNOSIS — R2689 Other abnormalities of gait and mobility: Secondary | ICD-10-CM | POA: Diagnosis not present

## 2019-05-14 DIAGNOSIS — R1312 Dysphagia, oropharyngeal phase: Secondary | ICD-10-CM | POA: Diagnosis not present

## 2019-05-14 DIAGNOSIS — D72829 Elevated white blood cell count, unspecified: Secondary | ICD-10-CM | POA: Diagnosis not present

## 2019-05-14 DIAGNOSIS — Z66 Do not resuscitate: Secondary | ICD-10-CM | POA: Diagnosis not present

## 2019-05-14 DIAGNOSIS — Z853 Personal history of malignant neoplasm of breast: Secondary | ICD-10-CM | POA: Diagnosis not present

## 2019-05-14 DIAGNOSIS — R52 Pain, unspecified: Secondary | ICD-10-CM | POA: Diagnosis not present

## 2019-05-14 DIAGNOSIS — Z9989 Dependence on other enabling machines and devices: Secondary | ICD-10-CM | POA: Diagnosis not present

## 2019-05-14 DIAGNOSIS — N184 Chronic kidney disease, stage 4 (severe): Secondary | ICD-10-CM | POA: Diagnosis not present

## 2019-05-14 DIAGNOSIS — J449 Chronic obstructive pulmonary disease, unspecified: Secondary | ICD-10-CM | POA: Diagnosis not present

## 2019-05-14 DIAGNOSIS — J9601 Acute respiratory failure with hypoxia: Secondary | ICD-10-CM | POA: Diagnosis not present

## 2019-05-14 DIAGNOSIS — R0602 Shortness of breath: Secondary | ICD-10-CM | POA: Diagnosis not present

## 2019-05-14 DIAGNOSIS — R112 Nausea with vomiting, unspecified: Secondary | ICD-10-CM | POA: Diagnosis not present

## 2019-05-14 DIAGNOSIS — R109 Unspecified abdominal pain: Secondary | ICD-10-CM | POA: Diagnosis not present

## 2019-05-14 DIAGNOSIS — J9621 Acute and chronic respiratory failure with hypoxia: Secondary | ICD-10-CM | POA: Diagnosis not present

## 2019-05-14 DIAGNOSIS — Z87891 Personal history of nicotine dependence: Secondary | ICD-10-CM | POA: Diagnosis not present

## 2019-05-14 DIAGNOSIS — E876 Hypokalemia: Secondary | ICD-10-CM | POA: Diagnosis not present

## 2019-05-14 DIAGNOSIS — R1084 Generalized abdominal pain: Secondary | ICD-10-CM | POA: Diagnosis not present

## 2019-05-14 DIAGNOSIS — I129 Hypertensive chronic kidney disease with stage 1 through stage 4 chronic kidney disease, or unspecified chronic kidney disease: Secondary | ICD-10-CM | POA: Diagnosis not present

## 2019-05-14 DIAGNOSIS — I4891 Unspecified atrial fibrillation: Secondary | ICD-10-CM | POA: Diagnosis not present

## 2019-05-14 DIAGNOSIS — R05 Cough: Secondary | ICD-10-CM | POA: Diagnosis not present

## 2019-05-14 DIAGNOSIS — I447 Left bundle-branch block, unspecified: Secondary | ICD-10-CM | POA: Diagnosis not present

## 2019-05-14 DIAGNOSIS — Z6841 Body Mass Index (BMI) 40.0 and over, adult: Secondary | ICD-10-CM | POA: Diagnosis not present

## 2019-05-23 DIAGNOSIS — J188 Other pneumonia, unspecified organism: Secondary | ICD-10-CM | POA: Diagnosis not present

## 2019-05-23 DIAGNOSIS — J9601 Acute respiratory failure with hypoxia: Secondary | ICD-10-CM | POA: Diagnosis not present

## 2019-05-23 DIAGNOSIS — M6281 Muscle weakness (generalized): Secondary | ICD-10-CM | POA: Diagnosis not present

## 2019-05-23 DIAGNOSIS — E11319 Type 2 diabetes mellitus with unspecified diabetic retinopathy without macular edema: Secondary | ICD-10-CM | POA: Diagnosis not present

## 2019-05-23 DIAGNOSIS — J9610 Chronic respiratory failure, unspecified whether with hypoxia or hypercapnia: Secondary | ICD-10-CM | POA: Diagnosis not present

## 2019-05-23 DIAGNOSIS — G4733 Obstructive sleep apnea (adult) (pediatric): Secondary | ICD-10-CM | POA: Diagnosis not present

## 2019-05-23 DIAGNOSIS — N179 Acute kidney failure, unspecified: Secondary | ICD-10-CM | POA: Diagnosis not present

## 2019-05-23 DIAGNOSIS — R1319 Other dysphagia: Secondary | ICD-10-CM | POA: Diagnosis not present

## 2019-05-23 DIAGNOSIS — I4891 Unspecified atrial fibrillation: Secondary | ICD-10-CM | POA: Diagnosis not present

## 2019-05-23 DIAGNOSIS — N184 Chronic kidney disease, stage 4 (severe): Secondary | ICD-10-CM | POA: Diagnosis not present

## 2019-05-23 DIAGNOSIS — D649 Anemia, unspecified: Secondary | ICD-10-CM | POA: Diagnosis not present

## 2019-05-23 DIAGNOSIS — J984 Other disorders of lung: Secondary | ICD-10-CM | POA: Diagnosis not present

## 2019-05-23 DIAGNOSIS — I951 Orthostatic hypotension: Secondary | ICD-10-CM | POA: Diagnosis not present

## 2019-05-23 DIAGNOSIS — E860 Dehydration: Secondary | ICD-10-CM | POA: Diagnosis not present

## 2019-05-23 DIAGNOSIS — I5030 Unspecified diastolic (congestive) heart failure: Secondary | ICD-10-CM | POA: Diagnosis not present

## 2019-05-23 DIAGNOSIS — R601 Generalized edema: Secondary | ICD-10-CM | POA: Diagnosis not present

## 2019-05-23 DIAGNOSIS — J449 Chronic obstructive pulmonary disease, unspecified: Secondary | ICD-10-CM | POA: Diagnosis not present

## 2019-05-23 DIAGNOSIS — E119 Type 2 diabetes mellitus without complications: Secondary | ICD-10-CM | POA: Diagnosis not present

## 2019-05-23 DIAGNOSIS — R2689 Other abnormalities of gait and mobility: Secondary | ICD-10-CM | POA: Diagnosis not present

## 2019-05-23 DIAGNOSIS — I959 Hypotension, unspecified: Secondary | ICD-10-CM | POA: Diagnosis not present

## 2019-05-23 DIAGNOSIS — N178 Other acute kidney failure: Secondary | ICD-10-CM | POA: Diagnosis not present

## 2019-05-23 DIAGNOSIS — N183 Chronic kidney disease, stage 3 unspecified: Secondary | ICD-10-CM | POA: Diagnosis not present

## 2019-05-23 DIAGNOSIS — R1312 Dysphagia, oropharyngeal phase: Secondary | ICD-10-CM | POA: Diagnosis not present

## 2019-05-23 DIAGNOSIS — D6489 Other specified anemias: Secondary | ICD-10-CM | POA: Diagnosis not present

## 2019-05-23 DIAGNOSIS — M1049 Other secondary gout, multiple sites: Secondary | ICD-10-CM | POA: Diagnosis not present

## 2019-05-23 DIAGNOSIS — R2681 Unsteadiness on feet: Secondary | ICD-10-CM | POA: Diagnosis not present

## 2019-05-23 DIAGNOSIS — I129 Hypertensive chronic kidney disease with stage 1 through stage 4 chronic kidney disease, or unspecified chronic kidney disease: Secondary | ICD-10-CM | POA: Diagnosis not present

## 2019-05-23 DIAGNOSIS — I480 Paroxysmal atrial fibrillation: Secondary | ICD-10-CM | POA: Diagnosis not present

## 2019-05-23 DIAGNOSIS — Z7901 Long term (current) use of anticoagulants: Secondary | ICD-10-CM | POA: Diagnosis not present

## 2019-05-23 DIAGNOSIS — I10 Essential (primary) hypertension: Secondary | ICD-10-CM | POA: Diagnosis not present

## 2019-05-23 DIAGNOSIS — D638 Anemia in other chronic diseases classified elsewhere: Secondary | ICD-10-CM | POA: Diagnosis not present

## 2019-05-23 DIAGNOSIS — R278 Other lack of coordination: Secondary | ICD-10-CM | POA: Diagnosis not present

## 2019-05-23 DIAGNOSIS — N185 Chronic kidney disease, stage 5: Secondary | ICD-10-CM | POA: Diagnosis not present

## 2019-05-26 DIAGNOSIS — I480 Paroxysmal atrial fibrillation: Secondary | ICD-10-CM | POA: Diagnosis not present

## 2019-05-26 DIAGNOSIS — E11319 Type 2 diabetes mellitus with unspecified diabetic retinopathy without macular edema: Secondary | ICD-10-CM | POA: Diagnosis not present

## 2019-05-26 DIAGNOSIS — G4733 Obstructive sleep apnea (adult) (pediatric): Secondary | ICD-10-CM | POA: Diagnosis not present

## 2019-05-26 DIAGNOSIS — N178 Other acute kidney failure: Secondary | ICD-10-CM | POA: Diagnosis not present

## 2019-05-26 DIAGNOSIS — J984 Other disorders of lung: Secondary | ICD-10-CM | POA: Diagnosis not present

## 2019-05-26 DIAGNOSIS — D6489 Other specified anemias: Secondary | ICD-10-CM | POA: Diagnosis not present

## 2019-05-26 DIAGNOSIS — J188 Other pneumonia, unspecified organism: Secondary | ICD-10-CM | POA: Diagnosis not present

## 2019-05-26 DIAGNOSIS — N184 Chronic kidney disease, stage 4 (severe): Secondary | ICD-10-CM | POA: Diagnosis not present

## 2019-05-26 DIAGNOSIS — J9610 Chronic respiratory failure, unspecified whether with hypoxia or hypercapnia: Secondary | ICD-10-CM | POA: Diagnosis not present

## 2019-05-27 DIAGNOSIS — M1049 Other secondary gout, multiple sites: Secondary | ICD-10-CM | POA: Diagnosis not present

## 2019-05-27 DIAGNOSIS — I951 Orthostatic hypotension: Secondary | ICD-10-CM | POA: Diagnosis not present

## 2019-05-27 DIAGNOSIS — J188 Other pneumonia, unspecified organism: Secondary | ICD-10-CM | POA: Diagnosis not present

## 2019-05-27 DIAGNOSIS — J984 Other disorders of lung: Secondary | ICD-10-CM | POA: Diagnosis not present

## 2019-05-27 DIAGNOSIS — E860 Dehydration: Secondary | ICD-10-CM | POA: Diagnosis not present

## 2019-05-28 DIAGNOSIS — Z7901 Long term (current) use of anticoagulants: Secondary | ICD-10-CM | POA: Diagnosis not present

## 2019-05-28 DIAGNOSIS — G4733 Obstructive sleep apnea (adult) (pediatric): Secondary | ICD-10-CM | POA: Diagnosis not present

## 2019-05-28 DIAGNOSIS — E860 Dehydration: Secondary | ICD-10-CM | POA: Diagnosis not present

## 2019-05-28 DIAGNOSIS — D638 Anemia in other chronic diseases classified elsewhere: Secondary | ICD-10-CM | POA: Diagnosis not present

## 2019-05-28 DIAGNOSIS — N185 Chronic kidney disease, stage 5: Secondary | ICD-10-CM | POA: Diagnosis not present

## 2019-05-28 DIAGNOSIS — R601 Generalized edema: Secondary | ICD-10-CM | POA: Diagnosis not present

## 2019-05-28 DIAGNOSIS — I951 Orthostatic hypotension: Secondary | ICD-10-CM | POA: Diagnosis not present

## 2019-05-28 DIAGNOSIS — I480 Paroxysmal atrial fibrillation: Secondary | ICD-10-CM | POA: Diagnosis not present

## 2019-05-28 DIAGNOSIS — N184 Chronic kidney disease, stage 4 (severe): Secondary | ICD-10-CM | POA: Diagnosis not present

## 2019-05-28 DIAGNOSIS — I129 Hypertensive chronic kidney disease with stage 1 through stage 4 chronic kidney disease, or unspecified chronic kidney disease: Secondary | ICD-10-CM | POA: Diagnosis not present

## 2019-05-28 DIAGNOSIS — J449 Chronic obstructive pulmonary disease, unspecified: Secondary | ICD-10-CM | POA: Diagnosis not present

## 2019-05-29 DIAGNOSIS — M1049 Other secondary gout, multiple sites: Secondary | ICD-10-CM | POA: Diagnosis not present

## 2019-05-29 DIAGNOSIS — I951 Orthostatic hypotension: Secondary | ICD-10-CM | POA: Diagnosis not present

## 2019-05-29 DIAGNOSIS — R1319 Other dysphagia: Secondary | ICD-10-CM | POA: Diagnosis not present

## 2019-05-29 DIAGNOSIS — R601 Generalized edema: Secondary | ICD-10-CM | POA: Diagnosis not present

## 2019-05-29 DIAGNOSIS — J9610 Chronic respiratory failure, unspecified whether with hypoxia or hypercapnia: Secondary | ICD-10-CM | POA: Diagnosis not present

## 2019-06-02 DIAGNOSIS — J9610 Chronic respiratory failure, unspecified whether with hypoxia or hypercapnia: Secondary | ICD-10-CM | POA: Diagnosis not present

## 2019-06-02 DIAGNOSIS — M1049 Other secondary gout, multiple sites: Secondary | ICD-10-CM | POA: Diagnosis not present

## 2019-06-02 DIAGNOSIS — R601 Generalized edema: Secondary | ICD-10-CM | POA: Diagnosis not present

## 2019-06-02 DIAGNOSIS — I951 Orthostatic hypotension: Secondary | ICD-10-CM | POA: Diagnosis not present

## 2019-06-04 DIAGNOSIS — M1049 Other secondary gout, multiple sites: Secondary | ICD-10-CM | POA: Diagnosis not present

## 2019-06-04 DIAGNOSIS — E11319 Type 2 diabetes mellitus with unspecified diabetic retinopathy without macular edema: Secondary | ICD-10-CM | POA: Diagnosis not present

## 2019-06-04 DIAGNOSIS — D6489 Other specified anemias: Secondary | ICD-10-CM | POA: Diagnosis not present

## 2019-06-04 DIAGNOSIS — J984 Other disorders of lung: Secondary | ICD-10-CM | POA: Diagnosis not present

## 2019-06-04 DIAGNOSIS — E119 Type 2 diabetes mellitus without complications: Secondary | ICD-10-CM | POA: Diagnosis not present

## 2019-06-04 DIAGNOSIS — J188 Other pneumonia, unspecified organism: Secondary | ICD-10-CM | POA: Diagnosis not present

## 2019-06-04 DIAGNOSIS — I951 Orthostatic hypotension: Secondary | ICD-10-CM | POA: Diagnosis not present

## 2019-06-04 DIAGNOSIS — R601 Generalized edema: Secondary | ICD-10-CM | POA: Diagnosis not present

## 2019-06-04 DIAGNOSIS — G4733 Obstructive sleep apnea (adult) (pediatric): Secondary | ICD-10-CM | POA: Diagnosis not present

## 2019-06-04 DIAGNOSIS — N184 Chronic kidney disease, stage 4 (severe): Secondary | ICD-10-CM | POA: Diagnosis not present

## 2019-06-04 DIAGNOSIS — I480 Paroxysmal atrial fibrillation: Secondary | ICD-10-CM | POA: Diagnosis not present

## 2019-06-10 DIAGNOSIS — D631 Anemia in chronic kidney disease: Secondary | ICD-10-CM | POA: Diagnosis not present

## 2019-06-10 DIAGNOSIS — Z8701 Personal history of pneumonia (recurrent): Secondary | ICD-10-CM | POA: Diagnosis not present

## 2019-06-10 DIAGNOSIS — K225 Diverticulum of esophagus, acquired: Secondary | ICD-10-CM | POA: Diagnosis not present

## 2019-06-10 DIAGNOSIS — H8111 Benign paroxysmal vertigo, right ear: Secondary | ICD-10-CM | POA: Diagnosis not present

## 2019-06-10 DIAGNOSIS — G4733 Obstructive sleep apnea (adult) (pediatric): Secondary | ICD-10-CM | POA: Diagnosis not present

## 2019-06-10 DIAGNOSIS — R251 Tremor, unspecified: Secondary | ICD-10-CM | POA: Diagnosis not present

## 2019-06-10 DIAGNOSIS — I480 Paroxysmal atrial fibrillation: Secondary | ICD-10-CM | POA: Diagnosis not present

## 2019-06-10 DIAGNOSIS — R1312 Dysphagia, oropharyngeal phase: Secondary | ICD-10-CM | POA: Diagnosis not present

## 2019-06-10 DIAGNOSIS — R21 Rash and other nonspecific skin eruption: Secondary | ICD-10-CM | POA: Diagnosis not present

## 2019-06-10 DIAGNOSIS — E039 Hypothyroidism, unspecified: Secondary | ICD-10-CM | POA: Diagnosis not present

## 2019-06-10 DIAGNOSIS — I132 Hypertensive heart and chronic kidney disease with heart failure and with stage 5 chronic kidney disease, or end stage renal disease: Secondary | ICD-10-CM | POA: Diagnosis not present

## 2019-06-10 DIAGNOSIS — D071 Carcinoma in situ of vulva: Secondary | ICD-10-CM | POA: Diagnosis not present

## 2019-06-10 DIAGNOSIS — J449 Chronic obstructive pulmonary disease, unspecified: Secondary | ICD-10-CM | POA: Diagnosis not present

## 2019-06-10 DIAGNOSIS — E538 Deficiency of other specified B group vitamins: Secondary | ICD-10-CM | POA: Diagnosis not present

## 2019-06-10 DIAGNOSIS — Z79891 Long term (current) use of opiate analgesic: Secondary | ICD-10-CM | POA: Diagnosis not present

## 2019-06-10 DIAGNOSIS — K760 Fatty (change of) liver, not elsewhere classified: Secondary | ICD-10-CM | POA: Diagnosis not present

## 2019-06-10 DIAGNOSIS — G8929 Other chronic pain: Secondary | ICD-10-CM | POA: Diagnosis not present

## 2019-06-10 DIAGNOSIS — J4520 Mild intermittent asthma, uncomplicated: Secondary | ICD-10-CM | POA: Diagnosis not present

## 2019-06-10 DIAGNOSIS — M8589 Other specified disorders of bone density and structure, multiple sites: Secondary | ICD-10-CM | POA: Diagnosis not present

## 2019-06-10 DIAGNOSIS — K589 Irritable bowel syndrome without diarrhea: Secondary | ICD-10-CM | POA: Diagnosis not present

## 2019-06-10 DIAGNOSIS — N185 Chronic kidney disease, stage 5: Secondary | ICD-10-CM | POA: Diagnosis not present

## 2019-06-10 DIAGNOSIS — G4700 Insomnia, unspecified: Secondary | ICD-10-CM | POA: Diagnosis not present

## 2019-06-10 DIAGNOSIS — Z7901 Long term (current) use of anticoagulants: Secondary | ICD-10-CM | POA: Diagnosis not present

## 2019-06-10 DIAGNOSIS — I5032 Chronic diastolic (congestive) heart failure: Secondary | ICD-10-CM | POA: Diagnosis not present

## 2019-06-10 DIAGNOSIS — Z853 Personal history of malignant neoplasm of breast: Secondary | ICD-10-CM | POA: Diagnosis not present

## 2019-06-13 DIAGNOSIS — K589 Irritable bowel syndrome without diarrhea: Secondary | ICD-10-CM | POA: Diagnosis not present

## 2019-06-13 DIAGNOSIS — G4700 Insomnia, unspecified: Secondary | ICD-10-CM | POA: Diagnosis not present

## 2019-06-13 DIAGNOSIS — K760 Fatty (change of) liver, not elsewhere classified: Secondary | ICD-10-CM | POA: Diagnosis not present

## 2019-06-13 DIAGNOSIS — I5032 Chronic diastolic (congestive) heart failure: Secondary | ICD-10-CM | POA: Diagnosis not present

## 2019-06-13 DIAGNOSIS — R1312 Dysphagia, oropharyngeal phase: Secondary | ICD-10-CM | POA: Diagnosis not present

## 2019-06-13 DIAGNOSIS — R21 Rash and other nonspecific skin eruption: Secondary | ICD-10-CM | POA: Diagnosis not present

## 2019-06-13 DIAGNOSIS — K225 Diverticulum of esophagus, acquired: Secondary | ICD-10-CM | POA: Diagnosis not present

## 2019-06-13 DIAGNOSIS — Z79891 Long term (current) use of opiate analgesic: Secondary | ICD-10-CM | POA: Diagnosis not present

## 2019-06-13 DIAGNOSIS — D631 Anemia in chronic kidney disease: Secondary | ICD-10-CM | POA: Diagnosis not present

## 2019-06-13 DIAGNOSIS — Z853 Personal history of malignant neoplasm of breast: Secondary | ICD-10-CM | POA: Diagnosis not present

## 2019-06-13 DIAGNOSIS — J4520 Mild intermittent asthma, uncomplicated: Secondary | ICD-10-CM | POA: Diagnosis not present

## 2019-06-13 DIAGNOSIS — J449 Chronic obstructive pulmonary disease, unspecified: Secondary | ICD-10-CM | POA: Diagnosis not present

## 2019-06-13 DIAGNOSIS — R251 Tremor, unspecified: Secondary | ICD-10-CM | POA: Diagnosis not present

## 2019-06-13 DIAGNOSIS — M8589 Other specified disorders of bone density and structure, multiple sites: Secondary | ICD-10-CM | POA: Diagnosis not present

## 2019-06-13 DIAGNOSIS — Z8701 Personal history of pneumonia (recurrent): Secondary | ICD-10-CM | POA: Diagnosis not present

## 2019-06-13 DIAGNOSIS — I480 Paroxysmal atrial fibrillation: Secondary | ICD-10-CM | POA: Diagnosis not present

## 2019-06-13 DIAGNOSIS — E039 Hypothyroidism, unspecified: Secondary | ICD-10-CM | POA: Diagnosis not present

## 2019-06-13 DIAGNOSIS — G4733 Obstructive sleep apnea (adult) (pediatric): Secondary | ICD-10-CM | POA: Diagnosis not present

## 2019-06-13 DIAGNOSIS — D071 Carcinoma in situ of vulva: Secondary | ICD-10-CM | POA: Diagnosis not present

## 2019-06-13 DIAGNOSIS — I132 Hypertensive heart and chronic kidney disease with heart failure and with stage 5 chronic kidney disease, or end stage renal disease: Secondary | ICD-10-CM | POA: Diagnosis not present

## 2019-06-13 DIAGNOSIS — H8111 Benign paroxysmal vertigo, right ear: Secondary | ICD-10-CM | POA: Diagnosis not present

## 2019-06-13 DIAGNOSIS — N185 Chronic kidney disease, stage 5: Secondary | ICD-10-CM | POA: Diagnosis not present

## 2019-06-13 DIAGNOSIS — E538 Deficiency of other specified B group vitamins: Secondary | ICD-10-CM | POA: Diagnosis not present

## 2019-06-13 DIAGNOSIS — Z7901 Long term (current) use of anticoagulants: Secondary | ICD-10-CM | POA: Diagnosis not present

## 2019-06-13 DIAGNOSIS — G8929 Other chronic pain: Secondary | ICD-10-CM | POA: Diagnosis not present

## 2019-06-14 DIAGNOSIS — I5032 Chronic diastolic (congestive) heart failure: Secondary | ICD-10-CM | POA: Diagnosis not present

## 2019-06-14 DIAGNOSIS — D071 Carcinoma in situ of vulva: Secondary | ICD-10-CM | POA: Diagnosis not present

## 2019-06-14 DIAGNOSIS — I480 Paroxysmal atrial fibrillation: Secondary | ICD-10-CM | POA: Diagnosis not present

## 2019-06-14 DIAGNOSIS — J449 Chronic obstructive pulmonary disease, unspecified: Secondary | ICD-10-CM | POA: Diagnosis not present

## 2019-06-14 DIAGNOSIS — K225 Diverticulum of esophagus, acquired: Secondary | ICD-10-CM | POA: Diagnosis not present

## 2019-06-14 DIAGNOSIS — R1312 Dysphagia, oropharyngeal phase: Secondary | ICD-10-CM | POA: Diagnosis not present

## 2019-06-14 DIAGNOSIS — K760 Fatty (change of) liver, not elsewhere classified: Secondary | ICD-10-CM | POA: Diagnosis not present

## 2019-06-14 DIAGNOSIS — Z7901 Long term (current) use of anticoagulants: Secondary | ICD-10-CM | POA: Diagnosis not present

## 2019-06-14 DIAGNOSIS — G4733 Obstructive sleep apnea (adult) (pediatric): Secondary | ICD-10-CM | POA: Diagnosis not present

## 2019-06-14 DIAGNOSIS — D631 Anemia in chronic kidney disease: Secondary | ICD-10-CM | POA: Diagnosis not present

## 2019-06-14 DIAGNOSIS — G4700 Insomnia, unspecified: Secondary | ICD-10-CM | POA: Diagnosis not present

## 2019-06-14 DIAGNOSIS — G8929 Other chronic pain: Secondary | ICD-10-CM | POA: Diagnosis not present

## 2019-06-14 DIAGNOSIS — R251 Tremor, unspecified: Secondary | ICD-10-CM | POA: Diagnosis not present

## 2019-06-14 DIAGNOSIS — E039 Hypothyroidism, unspecified: Secondary | ICD-10-CM | POA: Diagnosis not present

## 2019-06-14 DIAGNOSIS — M8589 Other specified disorders of bone density and structure, multiple sites: Secondary | ICD-10-CM | POA: Diagnosis not present

## 2019-06-14 DIAGNOSIS — K589 Irritable bowel syndrome without diarrhea: Secondary | ICD-10-CM | POA: Diagnosis not present

## 2019-06-14 DIAGNOSIS — R21 Rash and other nonspecific skin eruption: Secondary | ICD-10-CM | POA: Diagnosis not present

## 2019-06-14 DIAGNOSIS — Z8701 Personal history of pneumonia (recurrent): Secondary | ICD-10-CM | POA: Diagnosis not present

## 2019-06-14 DIAGNOSIS — I132 Hypertensive heart and chronic kidney disease with heart failure and with stage 5 chronic kidney disease, or end stage renal disease: Secondary | ICD-10-CM | POA: Diagnosis not present

## 2019-06-14 DIAGNOSIS — N185 Chronic kidney disease, stage 5: Secondary | ICD-10-CM | POA: Diagnosis not present

## 2019-06-14 DIAGNOSIS — H8111 Benign paroxysmal vertigo, right ear: Secondary | ICD-10-CM | POA: Diagnosis not present

## 2019-06-14 DIAGNOSIS — J4520 Mild intermittent asthma, uncomplicated: Secondary | ICD-10-CM | POA: Diagnosis not present

## 2019-06-14 DIAGNOSIS — Z79891 Long term (current) use of opiate analgesic: Secondary | ICD-10-CM | POA: Diagnosis not present

## 2019-06-14 DIAGNOSIS — E538 Deficiency of other specified B group vitamins: Secondary | ICD-10-CM | POA: Diagnosis not present

## 2019-06-14 DIAGNOSIS — Z853 Personal history of malignant neoplasm of breast: Secondary | ICD-10-CM | POA: Diagnosis not present

## 2019-06-15 DIAGNOSIS — I480 Paroxysmal atrial fibrillation: Secondary | ICD-10-CM | POA: Diagnosis not present

## 2019-06-15 DIAGNOSIS — J449 Chronic obstructive pulmonary disease, unspecified: Secondary | ICD-10-CM | POA: Diagnosis not present

## 2019-06-15 DIAGNOSIS — N185 Chronic kidney disease, stage 5: Secondary | ICD-10-CM | POA: Diagnosis not present

## 2019-06-15 DIAGNOSIS — I5030 Unspecified diastolic (congestive) heart failure: Secondary | ICD-10-CM | POA: Diagnosis not present

## 2019-06-15 DIAGNOSIS — G4733 Obstructive sleep apnea (adult) (pediatric): Secondary | ICD-10-CM | POA: Diagnosis not present

## 2019-06-16 DIAGNOSIS — D071 Carcinoma in situ of vulva: Secondary | ICD-10-CM | POA: Diagnosis not present

## 2019-06-16 DIAGNOSIS — E538 Deficiency of other specified B group vitamins: Secondary | ICD-10-CM | POA: Diagnosis not present

## 2019-06-16 DIAGNOSIS — R1312 Dysphagia, oropharyngeal phase: Secondary | ICD-10-CM | POA: Diagnosis not present

## 2019-06-16 DIAGNOSIS — N185 Chronic kidney disease, stage 5: Secondary | ICD-10-CM | POA: Diagnosis not present

## 2019-06-16 DIAGNOSIS — R251 Tremor, unspecified: Secondary | ICD-10-CM | POA: Diagnosis not present

## 2019-06-16 DIAGNOSIS — Z79891 Long term (current) use of opiate analgesic: Secondary | ICD-10-CM | POA: Diagnosis not present

## 2019-06-16 DIAGNOSIS — Z8701 Personal history of pneumonia (recurrent): Secondary | ICD-10-CM | POA: Diagnosis not present

## 2019-06-16 DIAGNOSIS — I5032 Chronic diastolic (congestive) heart failure: Secondary | ICD-10-CM | POA: Diagnosis not present

## 2019-06-16 DIAGNOSIS — G8929 Other chronic pain: Secondary | ICD-10-CM | POA: Diagnosis not present

## 2019-06-16 DIAGNOSIS — K760 Fatty (change of) liver, not elsewhere classified: Secondary | ICD-10-CM | POA: Diagnosis not present

## 2019-06-16 DIAGNOSIS — R21 Rash and other nonspecific skin eruption: Secondary | ICD-10-CM | POA: Diagnosis not present

## 2019-06-16 DIAGNOSIS — I132 Hypertensive heart and chronic kidney disease with heart failure and with stage 5 chronic kidney disease, or end stage renal disease: Secondary | ICD-10-CM | POA: Diagnosis not present

## 2019-06-16 DIAGNOSIS — D631 Anemia in chronic kidney disease: Secondary | ICD-10-CM | POA: Diagnosis not present

## 2019-06-16 DIAGNOSIS — K225 Diverticulum of esophagus, acquired: Secondary | ICD-10-CM | POA: Diagnosis not present

## 2019-06-16 DIAGNOSIS — E039 Hypothyroidism, unspecified: Secondary | ICD-10-CM | POA: Diagnosis not present

## 2019-06-16 DIAGNOSIS — I480 Paroxysmal atrial fibrillation: Secondary | ICD-10-CM | POA: Diagnosis not present

## 2019-06-16 DIAGNOSIS — Z853 Personal history of malignant neoplasm of breast: Secondary | ICD-10-CM | POA: Diagnosis not present

## 2019-06-16 DIAGNOSIS — M8589 Other specified disorders of bone density and structure, multiple sites: Secondary | ICD-10-CM | POA: Diagnosis not present

## 2019-06-16 DIAGNOSIS — Z7901 Long term (current) use of anticoagulants: Secondary | ICD-10-CM | POA: Diagnosis not present

## 2019-06-16 DIAGNOSIS — J449 Chronic obstructive pulmonary disease, unspecified: Secondary | ICD-10-CM | POA: Diagnosis not present

## 2019-06-16 DIAGNOSIS — J4520 Mild intermittent asthma, uncomplicated: Secondary | ICD-10-CM | POA: Diagnosis not present

## 2019-06-16 DIAGNOSIS — H8111 Benign paroxysmal vertigo, right ear: Secondary | ICD-10-CM | POA: Diagnosis not present

## 2019-06-16 DIAGNOSIS — G4700 Insomnia, unspecified: Secondary | ICD-10-CM | POA: Diagnosis not present

## 2019-06-16 DIAGNOSIS — G4733 Obstructive sleep apnea (adult) (pediatric): Secondary | ICD-10-CM | POA: Diagnosis not present

## 2019-06-16 DIAGNOSIS — K589 Irritable bowel syndrome without diarrhea: Secondary | ICD-10-CM | POA: Diagnosis not present

## 2019-06-19 DIAGNOSIS — R042 Hemoptysis: Secondary | ICD-10-CM | POA: Diagnosis not present

## 2019-06-19 DIAGNOSIS — J189 Pneumonia, unspecified organism: Secondary | ICD-10-CM | POA: Diagnosis not present

## 2019-06-19 DIAGNOSIS — Z780 Asymptomatic menopausal state: Secondary | ICD-10-CM | POA: Diagnosis not present

## 2019-06-19 DIAGNOSIS — M8589 Other specified disorders of bone density and structure, multiple sites: Secondary | ICD-10-CM | POA: Diagnosis not present

## 2019-06-19 DIAGNOSIS — M85852 Other specified disorders of bone density and structure, left thigh: Secondary | ICD-10-CM | POA: Diagnosis not present

## 2019-06-21 DIAGNOSIS — I5030 Unspecified diastolic (congestive) heart failure: Secondary | ICD-10-CM | POA: Diagnosis not present

## 2019-06-23 DIAGNOSIS — H8111 Benign paroxysmal vertigo, right ear: Secondary | ICD-10-CM | POA: Diagnosis not present

## 2019-06-23 DIAGNOSIS — D071 Carcinoma in situ of vulva: Secondary | ICD-10-CM | POA: Diagnosis not present

## 2019-06-23 DIAGNOSIS — Z853 Personal history of malignant neoplasm of breast: Secondary | ICD-10-CM | POA: Diagnosis not present

## 2019-06-23 DIAGNOSIS — G4733 Obstructive sleep apnea (adult) (pediatric): Secondary | ICD-10-CM | POA: Diagnosis not present

## 2019-06-23 DIAGNOSIS — D631 Anemia in chronic kidney disease: Secondary | ICD-10-CM | POA: Diagnosis not present

## 2019-06-23 DIAGNOSIS — J4520 Mild intermittent asthma, uncomplicated: Secondary | ICD-10-CM | POA: Diagnosis not present

## 2019-06-23 DIAGNOSIS — Z8701 Personal history of pneumonia (recurrent): Secondary | ICD-10-CM | POA: Diagnosis not present

## 2019-06-23 DIAGNOSIS — I132 Hypertensive heart and chronic kidney disease with heart failure and with stage 5 chronic kidney disease, or end stage renal disease: Secondary | ICD-10-CM | POA: Diagnosis not present

## 2019-06-23 DIAGNOSIS — N185 Chronic kidney disease, stage 5: Secondary | ICD-10-CM | POA: Diagnosis not present

## 2019-06-23 DIAGNOSIS — Z79891 Long term (current) use of opiate analgesic: Secondary | ICD-10-CM | POA: Diagnosis not present

## 2019-06-23 DIAGNOSIS — K589 Irritable bowel syndrome without diarrhea: Secondary | ICD-10-CM | POA: Diagnosis not present

## 2019-06-23 DIAGNOSIS — I5032 Chronic diastolic (congestive) heart failure: Secondary | ICD-10-CM | POA: Diagnosis not present

## 2019-06-23 DIAGNOSIS — I480 Paroxysmal atrial fibrillation: Secondary | ICD-10-CM | POA: Diagnosis not present

## 2019-06-23 DIAGNOSIS — G8929 Other chronic pain: Secondary | ICD-10-CM | POA: Diagnosis not present

## 2019-06-23 DIAGNOSIS — R251 Tremor, unspecified: Secondary | ICD-10-CM | POA: Diagnosis not present

## 2019-06-23 DIAGNOSIS — E538 Deficiency of other specified B group vitamins: Secondary | ICD-10-CM | POA: Diagnosis not present

## 2019-06-23 DIAGNOSIS — E039 Hypothyroidism, unspecified: Secondary | ICD-10-CM | POA: Diagnosis not present

## 2019-06-23 DIAGNOSIS — M8589 Other specified disorders of bone density and structure, multiple sites: Secondary | ICD-10-CM | POA: Diagnosis not present

## 2019-06-23 DIAGNOSIS — J449 Chronic obstructive pulmonary disease, unspecified: Secondary | ICD-10-CM | POA: Diagnosis not present

## 2019-06-23 DIAGNOSIS — Z7901 Long term (current) use of anticoagulants: Secondary | ICD-10-CM | POA: Diagnosis not present

## 2019-06-23 DIAGNOSIS — K760 Fatty (change of) liver, not elsewhere classified: Secondary | ICD-10-CM | POA: Diagnosis not present

## 2019-06-23 DIAGNOSIS — G4700 Insomnia, unspecified: Secondary | ICD-10-CM | POA: Diagnosis not present

## 2019-06-23 DIAGNOSIS — R21 Rash and other nonspecific skin eruption: Secondary | ICD-10-CM | POA: Diagnosis not present

## 2019-06-23 DIAGNOSIS — K225 Diverticulum of esophagus, acquired: Secondary | ICD-10-CM | POA: Diagnosis not present

## 2019-06-23 DIAGNOSIS — R1312 Dysphagia, oropharyngeal phase: Secondary | ICD-10-CM | POA: Diagnosis not present

## 2019-06-25 DIAGNOSIS — I10 Essential (primary) hypertension: Secondary | ICD-10-CM | POA: Diagnosis not present

## 2019-06-25 DIAGNOSIS — N189 Chronic kidney disease, unspecified: Secondary | ICD-10-CM | POA: Diagnosis not present

## 2019-06-25 DIAGNOSIS — N184 Chronic kidney disease, stage 4 (severe): Secondary | ICD-10-CM | POA: Diagnosis not present

## 2019-06-25 DIAGNOSIS — E039 Hypothyroidism, unspecified: Secondary | ICD-10-CM | POA: Diagnosis not present

## 2019-06-25 DIAGNOSIS — D649 Anemia, unspecified: Secondary | ICD-10-CM | POA: Diagnosis not present

## 2019-06-25 DIAGNOSIS — D509 Iron deficiency anemia, unspecified: Secondary | ICD-10-CM | POA: Diagnosis not present

## 2019-06-30 DIAGNOSIS — Z79891 Long term (current) use of opiate analgesic: Secondary | ICD-10-CM | POA: Diagnosis not present

## 2019-06-30 DIAGNOSIS — D631 Anemia in chronic kidney disease: Secondary | ICD-10-CM | POA: Diagnosis not present

## 2019-06-30 DIAGNOSIS — G8929 Other chronic pain: Secondary | ICD-10-CM | POA: Diagnosis not present

## 2019-06-30 DIAGNOSIS — R21 Rash and other nonspecific skin eruption: Secondary | ICD-10-CM | POA: Diagnosis not present

## 2019-06-30 DIAGNOSIS — Z8701 Personal history of pneumonia (recurrent): Secondary | ICD-10-CM | POA: Diagnosis not present

## 2019-06-30 DIAGNOSIS — J449 Chronic obstructive pulmonary disease, unspecified: Secondary | ICD-10-CM | POA: Diagnosis not present

## 2019-06-30 DIAGNOSIS — Z853 Personal history of malignant neoplasm of breast: Secondary | ICD-10-CM | POA: Diagnosis not present

## 2019-06-30 DIAGNOSIS — J4520 Mild intermittent asthma, uncomplicated: Secondary | ICD-10-CM | POA: Diagnosis not present

## 2019-06-30 DIAGNOSIS — R1312 Dysphagia, oropharyngeal phase: Secondary | ICD-10-CM | POA: Diagnosis not present

## 2019-06-30 DIAGNOSIS — I5032 Chronic diastolic (congestive) heart failure: Secondary | ICD-10-CM | POA: Diagnosis not present

## 2019-06-30 DIAGNOSIS — E039 Hypothyroidism, unspecified: Secondary | ICD-10-CM | POA: Diagnosis not present

## 2019-06-30 DIAGNOSIS — N185 Chronic kidney disease, stage 5: Secondary | ICD-10-CM | POA: Diagnosis not present

## 2019-06-30 DIAGNOSIS — Z7901 Long term (current) use of anticoagulants: Secondary | ICD-10-CM | POA: Diagnosis not present

## 2019-06-30 DIAGNOSIS — I480 Paroxysmal atrial fibrillation: Secondary | ICD-10-CM | POA: Diagnosis not present

## 2019-06-30 DIAGNOSIS — M8589 Other specified disorders of bone density and structure, multiple sites: Secondary | ICD-10-CM | POA: Diagnosis not present

## 2019-06-30 DIAGNOSIS — E538 Deficiency of other specified B group vitamins: Secondary | ICD-10-CM | POA: Diagnosis not present

## 2019-06-30 DIAGNOSIS — G4700 Insomnia, unspecified: Secondary | ICD-10-CM | POA: Diagnosis not present

## 2019-06-30 DIAGNOSIS — I132 Hypertensive heart and chronic kidney disease with heart failure and with stage 5 chronic kidney disease, or end stage renal disease: Secondary | ICD-10-CM | POA: Diagnosis not present

## 2019-06-30 DIAGNOSIS — D071 Carcinoma in situ of vulva: Secondary | ICD-10-CM | POA: Diagnosis not present

## 2019-06-30 DIAGNOSIS — G4733 Obstructive sleep apnea (adult) (pediatric): Secondary | ICD-10-CM | POA: Diagnosis not present

## 2019-06-30 DIAGNOSIS — H8111 Benign paroxysmal vertigo, right ear: Secondary | ICD-10-CM | POA: Diagnosis not present

## 2019-06-30 DIAGNOSIS — K589 Irritable bowel syndrome without diarrhea: Secondary | ICD-10-CM | POA: Diagnosis not present

## 2019-06-30 DIAGNOSIS — K760 Fatty (change of) liver, not elsewhere classified: Secondary | ICD-10-CM | POA: Diagnosis not present

## 2019-06-30 DIAGNOSIS — R251 Tremor, unspecified: Secondary | ICD-10-CM | POA: Diagnosis not present

## 2019-06-30 DIAGNOSIS — K225 Diverticulum of esophagus, acquired: Secondary | ICD-10-CM | POA: Diagnosis not present

## 2019-07-02 DIAGNOSIS — R0902 Hypoxemia: Secondary | ICD-10-CM | POA: Diagnosis not present

## 2019-07-02 DIAGNOSIS — R0600 Dyspnea, unspecified: Secondary | ICD-10-CM | POA: Diagnosis not present

## 2019-07-06 DIAGNOSIS — I132 Hypertensive heart and chronic kidney disease with heart failure and with stage 5 chronic kidney disease, or end stage renal disease: Secondary | ICD-10-CM | POA: Diagnosis not present

## 2019-07-06 DIAGNOSIS — I5032 Chronic diastolic (congestive) heart failure: Secondary | ICD-10-CM | POA: Diagnosis not present

## 2019-07-06 DIAGNOSIS — Z79891 Long term (current) use of opiate analgesic: Secondary | ICD-10-CM | POA: Diagnosis not present

## 2019-07-06 DIAGNOSIS — R21 Rash and other nonspecific skin eruption: Secondary | ICD-10-CM | POA: Diagnosis not present

## 2019-07-06 DIAGNOSIS — J449 Chronic obstructive pulmonary disease, unspecified: Secondary | ICD-10-CM | POA: Diagnosis not present

## 2019-07-06 DIAGNOSIS — D071 Carcinoma in situ of vulva: Secondary | ICD-10-CM | POA: Diagnosis not present

## 2019-07-06 DIAGNOSIS — D631 Anemia in chronic kidney disease: Secondary | ICD-10-CM | POA: Diagnosis not present

## 2019-07-06 DIAGNOSIS — K589 Irritable bowel syndrome without diarrhea: Secondary | ICD-10-CM | POA: Diagnosis not present

## 2019-07-06 DIAGNOSIS — I480 Paroxysmal atrial fibrillation: Secondary | ICD-10-CM | POA: Diagnosis not present

## 2019-07-06 DIAGNOSIS — E538 Deficiency of other specified B group vitamins: Secondary | ICD-10-CM | POA: Diagnosis not present

## 2019-07-06 DIAGNOSIS — Z8701 Personal history of pneumonia (recurrent): Secondary | ICD-10-CM | POA: Diagnosis not present

## 2019-07-06 DIAGNOSIS — G4733 Obstructive sleep apnea (adult) (pediatric): Secondary | ICD-10-CM | POA: Diagnosis not present

## 2019-07-06 DIAGNOSIS — Z853 Personal history of malignant neoplasm of breast: Secondary | ICD-10-CM | POA: Diagnosis not present

## 2019-07-06 DIAGNOSIS — E039 Hypothyroidism, unspecified: Secondary | ICD-10-CM | POA: Diagnosis not present

## 2019-07-06 DIAGNOSIS — G8929 Other chronic pain: Secondary | ICD-10-CM | POA: Diagnosis not present

## 2019-07-06 DIAGNOSIS — J4520 Mild intermittent asthma, uncomplicated: Secondary | ICD-10-CM | POA: Diagnosis not present

## 2019-07-06 DIAGNOSIS — K225 Diverticulum of esophagus, acquired: Secondary | ICD-10-CM | POA: Diagnosis not present

## 2019-07-06 DIAGNOSIS — G4700 Insomnia, unspecified: Secondary | ICD-10-CM | POA: Diagnosis not present

## 2019-07-06 DIAGNOSIS — M8589 Other specified disorders of bone density and structure, multiple sites: Secondary | ICD-10-CM | POA: Diagnosis not present

## 2019-07-06 DIAGNOSIS — N185 Chronic kidney disease, stage 5: Secondary | ICD-10-CM | POA: Diagnosis not present

## 2019-07-06 DIAGNOSIS — H8111 Benign paroxysmal vertigo, right ear: Secondary | ICD-10-CM | POA: Diagnosis not present

## 2019-07-06 DIAGNOSIS — R251 Tremor, unspecified: Secondary | ICD-10-CM | POA: Diagnosis not present

## 2019-07-06 DIAGNOSIS — R1312 Dysphagia, oropharyngeal phase: Secondary | ICD-10-CM | POA: Diagnosis not present

## 2019-07-06 DIAGNOSIS — K760 Fatty (change of) liver, not elsewhere classified: Secondary | ICD-10-CM | POA: Diagnosis not present

## 2019-07-06 DIAGNOSIS — Z7901 Long term (current) use of anticoagulants: Secondary | ICD-10-CM | POA: Diagnosis not present

## 2019-07-08 DIAGNOSIS — M545 Low back pain: Secondary | ICD-10-CM | POA: Diagnosis not present

## 2019-07-08 DIAGNOSIS — Z79899 Other long term (current) drug therapy: Secondary | ICD-10-CM | POA: Diagnosis not present

## 2019-07-08 DIAGNOSIS — Z9181 History of falling: Secondary | ICD-10-CM | POA: Diagnosis not present

## 2019-07-08 DIAGNOSIS — T887XXA Unspecified adverse effect of drug or medicament, initial encounter: Secondary | ICD-10-CM | POA: Diagnosis not present

## 2019-07-08 DIAGNOSIS — R400 Somnolence: Secondary | ICD-10-CM | POA: Diagnosis not present

## 2019-07-08 DIAGNOSIS — R4781 Slurred speech: Secondary | ICD-10-CM | POA: Diagnosis not present

## 2019-07-09 DIAGNOSIS — R404 Transient alteration of awareness: Secondary | ICD-10-CM | POA: Diagnosis not present

## 2019-07-09 DIAGNOSIS — I499 Cardiac arrhythmia, unspecified: Secondary | ICD-10-CM | POA: Diagnosis not present

## 2019-07-23 DIAGNOSIS — 419620001 Death: Secondary | SNOMED CT | POA: Diagnosis not present

## 2019-07-23 DEATH — deceased
